# Patient Record
Sex: Female | Born: 1935 | Race: White | Hispanic: No | Smoking: Never smoker
Health system: Southern US, Community
[De-identification: ages and names within clinical notes are randomized; demographics above are authoritative.]

## PROBLEM LIST (undated history)

## (undated) DIAGNOSIS — R413 Other amnesia: Secondary | ICD-10-CM

## (undated) DIAGNOSIS — H9190 Unspecified hearing loss, unspecified ear: Secondary | ICD-10-CM

## (undated) DIAGNOSIS — J45909 Unspecified asthma, uncomplicated: Secondary | ICD-10-CM

## (undated) DIAGNOSIS — D329 Benign neoplasm of meninges, unspecified: Secondary | ICD-10-CM

## (undated) DIAGNOSIS — R739 Hyperglycemia, unspecified: Secondary | ICD-10-CM

## (undated) DIAGNOSIS — K219 Gastro-esophageal reflux disease without esophagitis: Secondary | ICD-10-CM

## (undated) DIAGNOSIS — G4700 Insomnia, unspecified: Secondary | ICD-10-CM

## (undated) DIAGNOSIS — G5600 Carpal tunnel syndrome, unspecified upper limb: Secondary | ICD-10-CM

## (undated) DIAGNOSIS — I82402 Acute embolism and thrombosis of unspecified deep veins of left lower extremity: Secondary | ICD-10-CM

## (undated) DIAGNOSIS — G8929 Other chronic pain: Secondary | ICD-10-CM

## (undated) DIAGNOSIS — M545 Low back pain, unspecified: Secondary | ICD-10-CM

## (undated) DIAGNOSIS — E785 Hyperlipidemia, unspecified: Secondary | ICD-10-CM

## (undated) DIAGNOSIS — J309 Allergic rhinitis, unspecified: Secondary | ICD-10-CM

## (undated) DIAGNOSIS — M8580 Other specified disorders of bone density and structure, unspecified site: Secondary | ICD-10-CM

## (undated) DIAGNOSIS — I10 Essential (primary) hypertension: Secondary | ICD-10-CM

## (undated) DIAGNOSIS — K5900 Constipation, unspecified: Secondary | ICD-10-CM

## (undated) HISTORY — DX: Benign neoplasm of meninges, unspecified: D32.9

## (undated) HISTORY — DX: Hyperglycemia, unspecified: R73.9

## (undated) HISTORY — DX: Other chronic pain: G89.29

## (undated) HISTORY — DX: Essential (primary) hypertension: I10

## (undated) HISTORY — DX: Other amnesia: R41.3

## (undated) HISTORY — DX: Other specified disorders of bone density and structure, unspecified site: M85.80

## (undated) HISTORY — DX: Unspecified hearing loss, unspecified ear: H91.90

## (undated) HISTORY — DX: Insomnia, unspecified: G47.00

## (undated) HISTORY — DX: Carpal tunnel syndrome, unspecified upper limb: G56.00

## (undated) HISTORY — DX: Hyperlipidemia, unspecified: E78.5

## (undated) HISTORY — DX: Constipation, unspecified: K59.00

## (undated) HISTORY — DX: Low back pain, unspecified: M54.50

## (undated) HISTORY — DX: Allergic rhinitis, unspecified: J30.9

## (undated) HISTORY — DX: Low back pain: M54.5

## (undated) HISTORY — DX: Unspecified asthma, uncomplicated: J45.909

## (undated) HISTORY — DX: Gastro-esophageal reflux disease without esophagitis: K21.9

## (undated) HISTORY — DX: Acute embolism and thrombosis of unspecified deep veins of left lower extremity: I82.402

---

## 1978-01-06 DIAGNOSIS — G5600 Carpal tunnel syndrome, unspecified upper limb: Secondary | ICD-10-CM

## 1978-01-06 HISTORY — PX: CARPAL TUNNEL RELEASE: SHX101

## 1978-01-06 HISTORY — DX: Carpal tunnel syndrome, unspecified upper limb: G56.00

## 1997-01-06 HISTORY — PX: BACK SURGERY: SHX140

## 1997-12-11 ENCOUNTER — Other Ambulatory Visit: Admission: RE | Admit: 1997-12-11 | Discharge: 1997-12-11 | Payer: Self-pay | Admitting: Family Medicine

## 1998-01-06 HISTORY — PX: BRAIN TUMOR EXCISION: SHX577

## 1998-06-07 ENCOUNTER — Encounter: Payer: Self-pay | Admitting: Neurological Surgery

## 1998-06-08 ENCOUNTER — Encounter: Payer: Self-pay | Admitting: Neurological Surgery

## 1998-06-08 ENCOUNTER — Ambulatory Visit (HOSPITAL_COMMUNITY): Admission: RE | Admit: 1998-06-08 | Discharge: 1998-06-08 | Payer: Self-pay | Admitting: Neurological Surgery

## 1998-06-11 ENCOUNTER — Encounter: Payer: Self-pay | Admitting: Neurological Surgery

## 1998-06-11 ENCOUNTER — Inpatient Hospital Stay (HOSPITAL_COMMUNITY): Admission: RE | Admit: 1998-06-11 | Discharge: 1998-06-15 | Payer: Self-pay | Admitting: Neurological Surgery

## 1998-06-21 ENCOUNTER — Inpatient Hospital Stay (HOSPITAL_COMMUNITY): Admission: AD | Admit: 1998-06-21 | Discharge: 1998-07-02 | Payer: Self-pay | Admitting: Neurological Surgery

## 1998-06-21 ENCOUNTER — Encounter: Payer: Self-pay | Admitting: Neurological Surgery

## 1998-09-12 ENCOUNTER — Encounter: Payer: Self-pay | Admitting: Neurological Surgery

## 1998-09-12 ENCOUNTER — Ambulatory Visit (HOSPITAL_COMMUNITY): Admission: RE | Admit: 1998-09-12 | Discharge: 1998-09-12 | Payer: Self-pay | Admitting: Neurological Surgery

## 1999-04-05 ENCOUNTER — Ambulatory Visit (HOSPITAL_COMMUNITY): Admission: RE | Admit: 1999-04-05 | Discharge: 1999-04-05 | Payer: Self-pay | Admitting: Neurological Surgery

## 1999-04-05 ENCOUNTER — Encounter: Payer: Self-pay | Admitting: Neurological Surgery

## 1999-05-07 ENCOUNTER — Other Ambulatory Visit: Admission: RE | Admit: 1999-05-07 | Discharge: 1999-05-07 | Payer: Self-pay | Admitting: Family Medicine

## 2000-03-06 ENCOUNTER — Encounter: Payer: Self-pay | Admitting: Neurological Surgery

## 2000-03-06 ENCOUNTER — Ambulatory Visit (HOSPITAL_COMMUNITY): Admission: RE | Admit: 2000-03-06 | Discharge: 2000-03-06 | Payer: Self-pay | Admitting: Neurological Surgery

## 2001-03-25 ENCOUNTER — Ambulatory Visit (HOSPITAL_COMMUNITY): Admission: RE | Admit: 2001-03-25 | Discharge: 2001-03-25 | Payer: Self-pay | Admitting: Neurological Surgery

## 2001-03-25 ENCOUNTER — Encounter: Payer: Self-pay | Admitting: Neurological Surgery

## 2001-06-10 ENCOUNTER — Other Ambulatory Visit: Admission: RE | Admit: 2001-06-10 | Discharge: 2001-06-10 | Payer: Self-pay | Admitting: Family Medicine

## 2001-09-28 ENCOUNTER — Ambulatory Visit (HOSPITAL_COMMUNITY): Admission: RE | Admit: 2001-09-28 | Discharge: 2001-09-28 | Payer: Self-pay | Admitting: Neurological Surgery

## 2001-09-28 ENCOUNTER — Encounter: Payer: Self-pay | Admitting: Neurological Surgery

## 2002-07-18 ENCOUNTER — Encounter: Payer: Self-pay | Admitting: Family Medicine

## 2002-07-18 ENCOUNTER — Encounter: Admission: RE | Admit: 2002-07-18 | Discharge: 2002-07-18 | Payer: Self-pay | Admitting: Family Medicine

## 2003-09-20 ENCOUNTER — Ambulatory Visit (HOSPITAL_COMMUNITY): Admission: RE | Admit: 2003-09-20 | Discharge: 2003-09-20 | Payer: Self-pay | Admitting: Neurological Surgery

## 2004-03-12 ENCOUNTER — Encounter: Admission: RE | Admit: 2004-03-12 | Discharge: 2004-06-10 | Payer: Self-pay | Admitting: Family Medicine

## 2004-04-01 ENCOUNTER — Encounter: Admission: RE | Admit: 2004-04-01 | Discharge: 2004-04-01 | Payer: Self-pay | Admitting: Family Medicine

## 2004-08-30 ENCOUNTER — Other Ambulatory Visit: Admission: RE | Admit: 2004-08-30 | Discharge: 2004-08-30 | Payer: Self-pay | Admitting: Family Medicine

## 2004-09-19 ENCOUNTER — Ambulatory Visit (HOSPITAL_COMMUNITY): Admission: RE | Admit: 2004-09-19 | Discharge: 2004-09-19 | Payer: Self-pay | Admitting: Neurological Surgery

## 2005-11-26 ENCOUNTER — Encounter: Admission: RE | Admit: 2005-11-26 | Discharge: 2005-11-26 | Payer: Self-pay | Admitting: Family Medicine

## 2006-04-04 ENCOUNTER — Encounter: Admission: RE | Admit: 2006-04-04 | Discharge: 2006-04-04 | Payer: Self-pay | Admitting: Family Medicine

## 2006-06-10 ENCOUNTER — Encounter: Admission: RE | Admit: 2006-06-10 | Discharge: 2006-06-10 | Payer: Self-pay | Admitting: Family Medicine

## 2006-09-22 ENCOUNTER — Ambulatory Visit (HOSPITAL_COMMUNITY): Admission: RE | Admit: 2006-09-22 | Discharge: 2006-09-22 | Payer: Self-pay | Admitting: Neurological Surgery

## 2006-12-11 ENCOUNTER — Encounter: Admission: RE | Admit: 2006-12-11 | Discharge: 2006-12-11 | Payer: Self-pay | Admitting: Family Medicine

## 2008-02-02 ENCOUNTER — Ambulatory Visit (HOSPITAL_COMMUNITY): Admission: RE | Admit: 2008-02-02 | Discharge: 2008-02-02 | Payer: Self-pay | Admitting: Neurological Surgery

## 2009-03-12 ENCOUNTER — Emergency Department (HOSPITAL_COMMUNITY)
Admission: EM | Admit: 2009-03-12 | Discharge: 2009-03-12 | Payer: Self-pay | Source: Home / Self Care | Admitting: Emergency Medicine

## 2009-03-12 ENCOUNTER — Encounter: Admission: RE | Admit: 2009-03-12 | Discharge: 2009-03-12 | Payer: Self-pay | Admitting: Internal Medicine

## 2010-02-11 ENCOUNTER — Emergency Department (HOSPITAL_COMMUNITY)
Admission: EM | Admit: 2010-02-11 | Discharge: 2010-02-11 | Disposition: A | Discharge status: Home / Self Care | Payer: Medicare Other | Attending: Emergency Medicine | Admitting: Emergency Medicine

## 2010-02-11 DIAGNOSIS — R42 Dizziness and giddiness: Secondary | ICD-10-CM | POA: Insufficient documentation

## 2010-02-11 DIAGNOSIS — Z79899 Other long term (current) drug therapy: Secondary | ICD-10-CM | POA: Insufficient documentation

## 2010-02-11 DIAGNOSIS — R110 Nausea: Secondary | ICD-10-CM | POA: Insufficient documentation

## 2010-02-11 DIAGNOSIS — E119 Type 2 diabetes mellitus without complications: Secondary | ICD-10-CM | POA: Insufficient documentation

## 2010-02-11 DIAGNOSIS — I10 Essential (primary) hypertension: Secondary | ICD-10-CM | POA: Insufficient documentation

## 2010-02-11 LAB — URINALYSIS, ROUTINE W REFLEX MICROSCOPIC
Bilirubin Urine: NEGATIVE
Hgb urine dipstick: NEGATIVE
Leukocytes, UA: NEGATIVE
Nitrite: NEGATIVE
Specific Gravity, Urine: 1.017 (ref 1.005–1.030)
Urobilinogen, UA: 0.2 mg/dL (ref 0.0–1.0)
pH: 8 (ref 5.0–8.0)

## 2010-02-11 LAB — POCT I-STAT, CHEM 8
Calcium, Ion: 1.15 mmol/L (ref 1.12–1.32)
Creatinine, Ser: 1.1 mg/dL (ref 0.4–1.2)
Glucose, Bld: 166 mg/dL — ABNORMAL HIGH (ref 70–99)
HCT: 38 % (ref 36.0–46.0)
Hemoglobin: 12.9 g/dL (ref 12.0–15.0)
TCO2: 30 mmol/L (ref 0–100)

## 2010-02-11 LAB — URINE MICROSCOPIC-ADD ON

## 2010-02-11 LAB — GLUCOSE, CAPILLARY: Glucose-Capillary: 118 mg/dL — ABNORMAL HIGH (ref 70–99)

## 2010-03-07 ENCOUNTER — Ambulatory Visit
Admission: RE | Admit: 2010-03-07 | Discharge: 2010-03-07 | Disposition: A | Discharge status: Home / Self Care | Payer: Medicare Other | Source: Ambulatory Visit | Attending: Internal Medicine | Admitting: Internal Medicine

## 2010-03-07 ENCOUNTER — Other Ambulatory Visit: Payer: Self-pay | Admitting: Internal Medicine

## 2010-03-07 DIAGNOSIS — R52 Pain, unspecified: Secondary | ICD-10-CM

## 2010-03-31 LAB — COMPREHENSIVE METABOLIC PANEL
ALT: 18 U/L (ref 0–35)
AST: 24 U/L (ref 0–37)
Albumin: 4 g/dL (ref 3.5–5.2)
Alkaline Phosphatase: 49 U/L (ref 39–117)
CO2: 33 mEq/L — ABNORMAL HIGH (ref 19–32)
Calcium: 9.2 mg/dL (ref 8.4–10.5)
GFR calc non Af Amer: 57 mL/min — ABNORMAL LOW (ref >60)
Total Bilirubin: 0.6 mg/dL (ref 0.3–1.2)
Total Protein: 6.9 g/dL (ref 6.0–8.3)

## 2010-03-31 LAB — CBC
HCT: 38.6 % (ref 36.0–46.0)
Hemoglobin: 13.5 g/dL (ref 12.0–15.0)
MCV: 94.1 fL (ref 78.0–100.0)
Platelets: 224 10*3/uL (ref 150–400)
RBC: 4.1 MIL/uL (ref 3.87–5.11)
WBC: 6.8 10*3/uL (ref 4.0–10.5)

## 2010-03-31 LAB — PROTIME-INR: INR: 0.92 (ref 0.00–1.49)

## 2010-03-31 LAB — DIFFERENTIAL
Monocytes Absolute: 0.4 10*3/uL (ref 0.1–1.0)
Monocytes Relative: 7 % (ref 3–12)
Neutro Abs: 4 10*3/uL (ref 1.7–7.7)
Neutrophils Relative %: 59 % (ref 43–77)

## 2010-03-31 LAB — APTT: aPTT: 24 seconds (ref 24–37)

## 2010-04-22 LAB — CREATININE, SERUM: GFR calc non Af Amer: 56 mL/min — ABNORMAL LOW (ref >60)

## 2010-10-17 LAB — BUN: BUN: 19

## 2010-10-17 LAB — CREATININE, SERUM
Creatinine, Ser: 1.08
GFR calc Af Amer: 59 — ABNORMAL LOW
GFR calc non Af Amer: 49 — ABNORMAL LOW

## 2011-01-13 DIAGNOSIS — Z7901 Long term (current) use of anticoagulants: Secondary | ICD-10-CM | POA: Diagnosis not present

## 2011-01-13 DIAGNOSIS — I82890 Acute embolism and thrombosis of other specified veins: Secondary | ICD-10-CM | POA: Diagnosis not present

## 2011-01-16 DIAGNOSIS — Z1231 Encounter for screening mammogram for malignant neoplasm of breast: Secondary | ICD-10-CM | POA: Diagnosis not present

## 2011-02-12 DIAGNOSIS — E785 Hyperlipidemia, unspecified: Secondary | ICD-10-CM | POA: Diagnosis not present

## 2011-02-12 DIAGNOSIS — K219 Gastro-esophageal reflux disease without esophagitis: Secondary | ICD-10-CM | POA: Diagnosis not present

## 2011-02-12 DIAGNOSIS — Z0000 Encounter for general adult medical examination without abnormal findings: Secondary | ICD-10-CM | POA: Diagnosis not present

## 2011-02-12 DIAGNOSIS — E119 Type 2 diabetes mellitus without complications: Secondary | ICD-10-CM | POA: Diagnosis not present

## 2011-02-12 DIAGNOSIS — I10 Essential (primary) hypertension: Secondary | ICD-10-CM | POA: Diagnosis not present

## 2011-02-12 DIAGNOSIS — E1129 Type 2 diabetes mellitus with other diabetic kidney complication: Secondary | ICD-10-CM | POA: Diagnosis not present

## 2011-02-13 DIAGNOSIS — I82890 Acute embolism and thrombosis of other specified veins: Secondary | ICD-10-CM | POA: Diagnosis not present

## 2011-02-13 DIAGNOSIS — Z7901 Long term (current) use of anticoagulants: Secondary | ICD-10-CM | POA: Diagnosis not present

## 2011-02-19 DIAGNOSIS — I10 Essential (primary) hypertension: Secondary | ICD-10-CM | POA: Diagnosis not present

## 2011-02-19 DIAGNOSIS — E1129 Type 2 diabetes mellitus with other diabetic kidney complication: Secondary | ICD-10-CM | POA: Diagnosis not present

## 2011-02-19 DIAGNOSIS — E785 Hyperlipidemia, unspecified: Secondary | ICD-10-CM | POA: Diagnosis not present

## 2011-03-06 DIAGNOSIS — H16109 Unspecified superficial keratitis, unspecified eye: Secondary | ICD-10-CM | POA: Diagnosis not present

## 2011-03-06 DIAGNOSIS — Z961 Presence of intraocular lens: Secondary | ICD-10-CM | POA: Diagnosis not present

## 2011-03-06 DIAGNOSIS — H3530 Unspecified macular degeneration: Secondary | ICD-10-CM | POA: Diagnosis not present

## 2011-03-06 DIAGNOSIS — H04229 Epiphora due to insufficient drainage, unspecified lacrimal gland: Secondary | ICD-10-CM | POA: Diagnosis not present

## 2011-03-20 DIAGNOSIS — Z7901 Long term (current) use of anticoagulants: Secondary | ICD-10-CM | POA: Diagnosis not present

## 2011-03-20 DIAGNOSIS — I824Y9 Acute embolism and thrombosis of unspecified deep veins of unspecified proximal lower extremity: Secondary | ICD-10-CM | POA: Diagnosis not present

## 2011-04-07 DIAGNOSIS — H04229 Epiphora due to insufficient drainage, unspecified lacrimal gland: Secondary | ICD-10-CM | POA: Diagnosis not present

## 2011-04-21 DIAGNOSIS — Z7901 Long term (current) use of anticoagulants: Secondary | ICD-10-CM | POA: Diagnosis not present

## 2011-04-21 DIAGNOSIS — I10 Essential (primary) hypertension: Secondary | ICD-10-CM | POA: Diagnosis not present

## 2011-04-21 DIAGNOSIS — I824Y9 Acute embolism and thrombosis of unspecified deep veins of unspecified proximal lower extremity: Secondary | ICD-10-CM | POA: Diagnosis not present

## 2011-05-06 DIAGNOSIS — Z7901 Long term (current) use of anticoagulants: Secondary | ICD-10-CM | POA: Diagnosis not present

## 2011-05-06 DIAGNOSIS — I824Y9 Acute embolism and thrombosis of unspecified deep veins of unspecified proximal lower extremity: Secondary | ICD-10-CM | POA: Diagnosis not present

## 2011-05-09 DIAGNOSIS — K5900 Constipation, unspecified: Secondary | ICD-10-CM | POA: Diagnosis not present

## 2011-05-09 DIAGNOSIS — G609 Hereditary and idiopathic neuropathy, unspecified: Secondary | ICD-10-CM | POA: Diagnosis not present

## 2011-05-20 DIAGNOSIS — Z7901 Long term (current) use of anticoagulants: Secondary | ICD-10-CM | POA: Diagnosis not present

## 2011-05-20 DIAGNOSIS — I10 Essential (primary) hypertension: Secondary | ICD-10-CM | POA: Diagnosis not present

## 2011-05-20 DIAGNOSIS — I824Y9 Acute embolism and thrombosis of unspecified deep veins of unspecified proximal lower extremity: Secondary | ICD-10-CM | POA: Diagnosis not present

## 2011-06-03 DIAGNOSIS — I824Y9 Acute embolism and thrombosis of unspecified deep veins of unspecified proximal lower extremity: Secondary | ICD-10-CM | POA: Diagnosis not present

## 2011-06-03 DIAGNOSIS — I10 Essential (primary) hypertension: Secondary | ICD-10-CM | POA: Diagnosis not present

## 2011-06-03 DIAGNOSIS — Z7901 Long term (current) use of anticoagulants: Secondary | ICD-10-CM | POA: Diagnosis not present

## 2011-06-09 DIAGNOSIS — G609 Hereditary and idiopathic neuropathy, unspecified: Secondary | ICD-10-CM | POA: Diagnosis not present

## 2011-06-09 DIAGNOSIS — Z961 Presence of intraocular lens: Secondary | ICD-10-CM | POA: Diagnosis not present

## 2011-06-09 DIAGNOSIS — K5900 Constipation, unspecified: Secondary | ICD-10-CM | POA: Diagnosis not present

## 2011-06-09 DIAGNOSIS — H04129 Dry eye syndrome of unspecified lacrimal gland: Secondary | ICD-10-CM | POA: Diagnosis not present

## 2011-06-09 DIAGNOSIS — H01009 Unspecified blepharitis unspecified eye, unspecified eyelid: Secondary | ICD-10-CM | POA: Diagnosis not present

## 2011-07-03 DIAGNOSIS — I824Y9 Acute embolism and thrombosis of unspecified deep veins of unspecified proximal lower extremity: Secondary | ICD-10-CM | POA: Diagnosis not present

## 2011-07-03 DIAGNOSIS — Z7901 Long term (current) use of anticoagulants: Secondary | ICD-10-CM | POA: Diagnosis not present

## 2011-07-03 DIAGNOSIS — I10 Essential (primary) hypertension: Secondary | ICD-10-CM | POA: Diagnosis not present

## 2011-07-08 DIAGNOSIS — H04229 Epiphora due to insufficient drainage, unspecified lacrimal gland: Secondary | ICD-10-CM | POA: Diagnosis not present

## 2011-07-18 DIAGNOSIS — H04229 Epiphora due to insufficient drainage, unspecified lacrimal gland: Secondary | ICD-10-CM | POA: Diagnosis not present

## 2011-08-04 DIAGNOSIS — Z7901 Long term (current) use of anticoagulants: Secondary | ICD-10-CM | POA: Diagnosis not present

## 2011-08-04 DIAGNOSIS — I824Y9 Acute embolism and thrombosis of unspecified deep veins of unspecified proximal lower extremity: Secondary | ICD-10-CM | POA: Diagnosis not present

## 2011-08-04 DIAGNOSIS — I10 Essential (primary) hypertension: Secondary | ICD-10-CM | POA: Diagnosis not present

## 2011-08-19 DIAGNOSIS — E1129 Type 2 diabetes mellitus with other diabetic kidney complication: Secondary | ICD-10-CM | POA: Diagnosis not present

## 2011-08-19 DIAGNOSIS — E039 Hypothyroidism, unspecified: Secondary | ICD-10-CM | POA: Diagnosis not present

## 2011-08-19 DIAGNOSIS — E785 Hyperlipidemia, unspecified: Secondary | ICD-10-CM | POA: Diagnosis not present

## 2011-08-19 DIAGNOSIS — I10 Essential (primary) hypertension: Secondary | ICD-10-CM | POA: Diagnosis not present

## 2011-08-26 DIAGNOSIS — E785 Hyperlipidemia, unspecified: Secondary | ICD-10-CM | POA: Diagnosis not present

## 2011-08-26 DIAGNOSIS — E1129 Type 2 diabetes mellitus with other diabetic kidney complication: Secondary | ICD-10-CM | POA: Diagnosis not present

## 2011-08-26 DIAGNOSIS — I10 Essential (primary) hypertension: Secondary | ICD-10-CM | POA: Diagnosis not present

## 2011-09-01 DIAGNOSIS — Z7901 Long term (current) use of anticoagulants: Secondary | ICD-10-CM | POA: Diagnosis not present

## 2011-09-01 DIAGNOSIS — I824Y9 Acute embolism and thrombosis of unspecified deep veins of unspecified proximal lower extremity: Secondary | ICD-10-CM | POA: Diagnosis not present

## 2011-09-01 DIAGNOSIS — I10 Essential (primary) hypertension: Secondary | ICD-10-CM | POA: Diagnosis not present

## 2011-09-15 DIAGNOSIS — I10 Essential (primary) hypertension: Secondary | ICD-10-CM | POA: Diagnosis not present

## 2011-09-15 DIAGNOSIS — Z7901 Long term (current) use of anticoagulants: Secondary | ICD-10-CM | POA: Diagnosis not present

## 2011-09-15 DIAGNOSIS — I824Y9 Acute embolism and thrombosis of unspecified deep veins of unspecified proximal lower extremity: Secondary | ICD-10-CM | POA: Diagnosis not present

## 2011-09-29 DIAGNOSIS — H01029 Squamous blepharitis unspecified eye, unspecified eyelid: Secondary | ICD-10-CM | POA: Diagnosis not present

## 2011-09-29 DIAGNOSIS — J342 Deviated nasal septum: Secondary | ICD-10-CM | POA: Diagnosis not present

## 2011-09-29 DIAGNOSIS — H04559 Acquired stenosis of unspecified nasolacrimal duct: Secondary | ICD-10-CM | POA: Diagnosis not present

## 2011-09-29 DIAGNOSIS — H04129 Dry eye syndrome of unspecified lacrimal gland: Secondary | ICD-10-CM | POA: Diagnosis not present

## 2011-09-29 DIAGNOSIS — H04229 Epiphora due to insufficient drainage, unspecified lacrimal gland: Secondary | ICD-10-CM | POA: Diagnosis not present

## 2011-09-29 DIAGNOSIS — H11439 Conjunctival hyperemia, unspecified eye: Secondary | ICD-10-CM | POA: Diagnosis not present

## 2011-10-14 DIAGNOSIS — Z23 Encounter for immunization: Secondary | ICD-10-CM | POA: Diagnosis not present

## 2011-10-14 DIAGNOSIS — Z7901 Long term (current) use of anticoagulants: Secondary | ICD-10-CM | POA: Diagnosis not present

## 2011-10-14 DIAGNOSIS — I824Y9 Acute embolism and thrombosis of unspecified deep veins of unspecified proximal lower extremity: Secondary | ICD-10-CM | POA: Diagnosis not present

## 2011-11-03 ENCOUNTER — Other Ambulatory Visit: Payer: Self-pay | Admitting: Ophthalmology

## 2011-11-03 DIAGNOSIS — D3150 Benign neoplasm of unspecified lacrimal gland and duct: Secondary | ICD-10-CM | POA: Diagnosis not present

## 2011-11-03 DIAGNOSIS — H04229 Epiphora due to insufficient drainage, unspecified lacrimal gland: Secondary | ICD-10-CM | POA: Diagnosis not present

## 2011-11-03 DIAGNOSIS — H5789 Other specified disorders of eye and adnexa: Secondary | ICD-10-CM | POA: Diagnosis not present

## 2011-11-03 DIAGNOSIS — H04559 Acquired stenosis of unspecified nasolacrimal duct: Secondary | ICD-10-CM | POA: Diagnosis not present

## 2011-11-03 DIAGNOSIS — H04569 Stenosis of unspecified lacrimal punctum: Secondary | ICD-10-CM | POA: Diagnosis not present

## 2011-11-25 DIAGNOSIS — Z7901 Long term (current) use of anticoagulants: Secondary | ICD-10-CM | POA: Diagnosis not present

## 2011-11-25 DIAGNOSIS — I824Y9 Acute embolism and thrombosis of unspecified deep veins of unspecified proximal lower extremity: Secondary | ICD-10-CM | POA: Diagnosis not present

## 2011-11-25 DIAGNOSIS — I10 Essential (primary) hypertension: Secondary | ICD-10-CM | POA: Diagnosis not present

## 2011-12-23 DIAGNOSIS — Z7901 Long term (current) use of anticoagulants: Secondary | ICD-10-CM | POA: Diagnosis not present

## 2011-12-23 DIAGNOSIS — I824Y9 Acute embolism and thrombosis of unspecified deep veins of unspecified proximal lower extremity: Secondary | ICD-10-CM | POA: Diagnosis not present

## 2011-12-23 DIAGNOSIS — I10 Essential (primary) hypertension: Secondary | ICD-10-CM | POA: Diagnosis not present

## 2012-01-07 HISTORY — PX: TEAR DUCT PROBING: SHX793

## 2012-01-20 DIAGNOSIS — I10 Essential (primary) hypertension: Secondary | ICD-10-CM | POA: Diagnosis not present

## 2012-01-20 DIAGNOSIS — Z7901 Long term (current) use of anticoagulants: Secondary | ICD-10-CM | POA: Diagnosis not present

## 2012-01-20 DIAGNOSIS — I824Y9 Acute embolism and thrombosis of unspecified deep veins of unspecified proximal lower extremity: Secondary | ICD-10-CM | POA: Diagnosis not present

## 2012-02-03 DIAGNOSIS — Z7901 Long term (current) use of anticoagulants: Secondary | ICD-10-CM | POA: Diagnosis not present

## 2012-02-03 DIAGNOSIS — I824Y9 Acute embolism and thrombosis of unspecified deep veins of unspecified proximal lower extremity: Secondary | ICD-10-CM | POA: Diagnosis not present

## 2012-03-02 DIAGNOSIS — Z7901 Long term (current) use of anticoagulants: Secondary | ICD-10-CM | POA: Diagnosis not present

## 2012-03-02 DIAGNOSIS — I10 Essential (primary) hypertension: Secondary | ICD-10-CM | POA: Diagnosis not present

## 2012-03-02 DIAGNOSIS — I824Y9 Acute embolism and thrombosis of unspecified deep veins of unspecified proximal lower extremity: Secondary | ICD-10-CM | POA: Diagnosis not present

## 2012-03-05 DIAGNOSIS — E1129 Type 2 diabetes mellitus with other diabetic kidney complication: Secondary | ICD-10-CM | POA: Diagnosis not present

## 2012-03-05 DIAGNOSIS — E785 Hyperlipidemia, unspecified: Secondary | ICD-10-CM | POA: Diagnosis not present

## 2012-03-05 DIAGNOSIS — Z1331 Encounter for screening for depression: Secondary | ICD-10-CM | POA: Diagnosis not present

## 2012-03-05 DIAGNOSIS — E039 Hypothyroidism, unspecified: Secondary | ICD-10-CM | POA: Diagnosis not present

## 2012-03-05 DIAGNOSIS — Z0000 Encounter for general adult medical examination without abnormal findings: Secondary | ICD-10-CM | POA: Diagnosis not present

## 2012-03-05 DIAGNOSIS — I10 Essential (primary) hypertension: Secondary | ICD-10-CM | POA: Diagnosis not present

## 2012-03-11 DIAGNOSIS — E1129 Type 2 diabetes mellitus with other diabetic kidney complication: Secondary | ICD-10-CM | POA: Diagnosis not present

## 2012-03-11 DIAGNOSIS — I10 Essential (primary) hypertension: Secondary | ICD-10-CM | POA: Diagnosis not present

## 2012-03-11 DIAGNOSIS — E785 Hyperlipidemia, unspecified: Secondary | ICD-10-CM | POA: Diagnosis not present

## 2012-03-30 DIAGNOSIS — I10 Essential (primary) hypertension: Secondary | ICD-10-CM | POA: Diagnosis not present

## 2012-03-30 DIAGNOSIS — Z7901 Long term (current) use of anticoagulants: Secondary | ICD-10-CM | POA: Diagnosis not present

## 2012-03-30 DIAGNOSIS — I824Y9 Acute embolism and thrombosis of unspecified deep veins of unspecified proximal lower extremity: Secondary | ICD-10-CM | POA: Diagnosis not present

## 2012-04-09 DIAGNOSIS — E119 Type 2 diabetes mellitus without complications: Secondary | ICD-10-CM | POA: Diagnosis not present

## 2012-04-09 DIAGNOSIS — H6120 Impacted cerumen, unspecified ear: Secondary | ICD-10-CM | POA: Diagnosis not present

## 2012-04-09 DIAGNOSIS — H52209 Unspecified astigmatism, unspecified eye: Secondary | ICD-10-CM | POA: Diagnosis not present

## 2012-04-09 DIAGNOSIS — H0019 Chalazion unspecified eye, unspecified eyelid: Secondary | ICD-10-CM | POA: Diagnosis not present

## 2012-04-09 DIAGNOSIS — H3530 Unspecified macular degeneration: Secondary | ICD-10-CM | POA: Diagnosis not present

## 2012-04-29 DIAGNOSIS — Z7901 Long term (current) use of anticoagulants: Secondary | ICD-10-CM | POA: Diagnosis not present

## 2012-04-29 DIAGNOSIS — I10 Essential (primary) hypertension: Secondary | ICD-10-CM | POA: Diagnosis not present

## 2012-04-29 DIAGNOSIS — I824Y9 Acute embolism and thrombosis of unspecified deep veins of unspecified proximal lower extremity: Secondary | ICD-10-CM | POA: Diagnosis not present

## 2012-04-30 DIAGNOSIS — J019 Acute sinusitis, unspecified: Secondary | ICD-10-CM | POA: Diagnosis not present

## 2012-05-27 DIAGNOSIS — Z7901 Long term (current) use of anticoagulants: Secondary | ICD-10-CM | POA: Diagnosis not present

## 2012-05-27 DIAGNOSIS — I824Y9 Acute embolism and thrombosis of unspecified deep veins of unspecified proximal lower extremity: Secondary | ICD-10-CM | POA: Diagnosis not present

## 2012-06-10 DIAGNOSIS — H579 Unspecified disorder of eye and adnexa: Secondary | ICD-10-CM | POA: Diagnosis not present

## 2012-06-10 DIAGNOSIS — H01009 Unspecified blepharitis unspecified eye, unspecified eyelid: Secondary | ICD-10-CM | POA: Diagnosis not present

## 2012-06-10 DIAGNOSIS — H04129 Dry eye syndrome of unspecified lacrimal gland: Secondary | ICD-10-CM | POA: Diagnosis not present

## 2012-06-10 DIAGNOSIS — Z7901 Long term (current) use of anticoagulants: Secondary | ICD-10-CM | POA: Diagnosis not present

## 2012-06-10 DIAGNOSIS — I824Y9 Acute embolism and thrombosis of unspecified deep veins of unspecified proximal lower extremity: Secondary | ICD-10-CM | POA: Diagnosis not present

## 2012-07-12 DIAGNOSIS — Z7901 Long term (current) use of anticoagulants: Secondary | ICD-10-CM | POA: Diagnosis not present

## 2012-07-12 DIAGNOSIS — I10 Essential (primary) hypertension: Secondary | ICD-10-CM | POA: Diagnosis not present

## 2012-07-12 DIAGNOSIS — I824Y9 Acute embolism and thrombosis of unspecified deep veins of unspecified proximal lower extremity: Secondary | ICD-10-CM | POA: Diagnosis not present

## 2012-07-27 DIAGNOSIS — I824Y9 Acute embolism and thrombosis of unspecified deep veins of unspecified proximal lower extremity: Secondary | ICD-10-CM | POA: Diagnosis not present

## 2012-07-27 DIAGNOSIS — Z7901 Long term (current) use of anticoagulants: Secondary | ICD-10-CM | POA: Diagnosis not present

## 2012-08-02 DIAGNOSIS — K5900 Constipation, unspecified: Secondary | ICD-10-CM | POA: Diagnosis not present

## 2012-08-18 DIAGNOSIS — H01009 Unspecified blepharitis unspecified eye, unspecified eyelid: Secondary | ICD-10-CM | POA: Diagnosis not present

## 2012-08-18 DIAGNOSIS — Z961 Presence of intraocular lens: Secondary | ICD-10-CM | POA: Diagnosis not present

## 2012-08-18 DIAGNOSIS — H1010 Acute atopic conjunctivitis, unspecified eye: Secondary | ICD-10-CM | POA: Diagnosis not present

## 2012-08-26 DIAGNOSIS — Z7901 Long term (current) use of anticoagulants: Secondary | ICD-10-CM | POA: Diagnosis not present

## 2012-08-26 DIAGNOSIS — I824Y9 Acute embolism and thrombosis of unspecified deep veins of unspecified proximal lower extremity: Secondary | ICD-10-CM | POA: Diagnosis not present

## 2012-09-08 DIAGNOSIS — H1045 Other chronic allergic conjunctivitis: Secondary | ICD-10-CM | POA: Diagnosis not present

## 2012-09-08 DIAGNOSIS — H01009 Unspecified blepharitis unspecified eye, unspecified eyelid: Secondary | ICD-10-CM | POA: Diagnosis not present

## 2012-09-08 DIAGNOSIS — H04229 Epiphora due to insufficient drainage, unspecified lacrimal gland: Secondary | ICD-10-CM | POA: Diagnosis not present

## 2012-09-09 DIAGNOSIS — I10 Essential (primary) hypertension: Secondary | ICD-10-CM | POA: Diagnosis not present

## 2012-09-09 DIAGNOSIS — E039 Hypothyroidism, unspecified: Secondary | ICD-10-CM | POA: Diagnosis not present

## 2012-09-09 DIAGNOSIS — E1129 Type 2 diabetes mellitus with other diabetic kidney complication: Secondary | ICD-10-CM | POA: Diagnosis not present

## 2012-09-16 DIAGNOSIS — E1129 Type 2 diabetes mellitus with other diabetic kidney complication: Secondary | ICD-10-CM | POA: Diagnosis not present

## 2012-09-16 DIAGNOSIS — Z23 Encounter for immunization: Secondary | ICD-10-CM | POA: Diagnosis not present

## 2012-09-16 DIAGNOSIS — E785 Hyperlipidemia, unspecified: Secondary | ICD-10-CM | POA: Diagnosis not present

## 2012-09-16 DIAGNOSIS — I10 Essential (primary) hypertension: Secondary | ICD-10-CM | POA: Diagnosis not present

## 2012-09-16 DIAGNOSIS — E039 Hypothyroidism, unspecified: Secondary | ICD-10-CM | POA: Diagnosis not present

## 2012-09-23 DIAGNOSIS — Z7901 Long term (current) use of anticoagulants: Secondary | ICD-10-CM | POA: Diagnosis not present

## 2012-09-23 DIAGNOSIS — I824Y9 Acute embolism and thrombosis of unspecified deep veins of unspecified proximal lower extremity: Secondary | ICD-10-CM | POA: Diagnosis not present

## 2012-10-15 DIAGNOSIS — H01029 Squamous blepharitis unspecified eye, unspecified eyelid: Secondary | ICD-10-CM | POA: Diagnosis not present

## 2012-10-15 DIAGNOSIS — H11439 Conjunctival hyperemia, unspecified eye: Secondary | ICD-10-CM | POA: Diagnosis not present

## 2012-10-15 DIAGNOSIS — H04229 Epiphora due to insufficient drainage, unspecified lacrimal gland: Secondary | ICD-10-CM | POA: Diagnosis not present

## 2012-10-15 DIAGNOSIS — H04129 Dry eye syndrome of unspecified lacrimal gland: Secondary | ICD-10-CM | POA: Diagnosis not present

## 2012-10-15 DIAGNOSIS — H04559 Acquired stenosis of unspecified nasolacrimal duct: Secondary | ICD-10-CM | POA: Diagnosis not present

## 2012-10-21 DIAGNOSIS — Z7901 Long term (current) use of anticoagulants: Secondary | ICD-10-CM | POA: Diagnosis not present

## 2012-10-21 DIAGNOSIS — I824Y9 Acute embolism and thrombosis of unspecified deep veins of unspecified proximal lower extremity: Secondary | ICD-10-CM | POA: Diagnosis not present

## 2012-11-02 DIAGNOSIS — I10 Essential (primary) hypertension: Secondary | ICD-10-CM | POA: Diagnosis not present

## 2012-11-02 DIAGNOSIS — Z01818 Encounter for other preprocedural examination: Secondary | ICD-10-CM | POA: Diagnosis not present

## 2012-11-04 DIAGNOSIS — I824Y9 Acute embolism and thrombosis of unspecified deep veins of unspecified proximal lower extremity: Secondary | ICD-10-CM | POA: Diagnosis not present

## 2012-11-04 DIAGNOSIS — E039 Hypothyroidism, unspecified: Secondary | ICD-10-CM | POA: Diagnosis not present

## 2012-11-04 DIAGNOSIS — Z7901 Long term (current) use of anticoagulants: Secondary | ICD-10-CM | POA: Diagnosis not present

## 2012-11-04 DIAGNOSIS — E785 Hyperlipidemia, unspecified: Secondary | ICD-10-CM | POA: Diagnosis not present

## 2012-11-04 DIAGNOSIS — Z006 Encounter for examination for normal comparison and control in clinical research program: Secondary | ICD-10-CM | POA: Diagnosis not present

## 2012-11-04 DIAGNOSIS — I10 Essential (primary) hypertension: Secondary | ICD-10-CM | POA: Diagnosis not present

## 2012-11-29 DIAGNOSIS — I824Y9 Acute embolism and thrombosis of unspecified deep veins of unspecified proximal lower extremity: Secondary | ICD-10-CM | POA: Diagnosis not present

## 2012-11-29 DIAGNOSIS — Z7901 Long term (current) use of anticoagulants: Secondary | ICD-10-CM | POA: Diagnosis not present

## 2012-12-27 DIAGNOSIS — I824Y9 Acute embolism and thrombosis of unspecified deep veins of unspecified proximal lower extremity: Secondary | ICD-10-CM | POA: Diagnosis not present

## 2012-12-27 DIAGNOSIS — Z7901 Long term (current) use of anticoagulants: Secondary | ICD-10-CM | POA: Diagnosis not present

## 2013-01-10 DIAGNOSIS — K5900 Constipation, unspecified: Secondary | ICD-10-CM | POA: Diagnosis not present

## 2013-01-17 ENCOUNTER — Other Ambulatory Visit: Payer: Self-pay | Admitting: Ophthalmology

## 2013-01-17 DIAGNOSIS — D3150 Benign neoplasm of unspecified lacrimal gland and duct: Secondary | ICD-10-CM | POA: Diagnosis not present

## 2013-01-17 DIAGNOSIS — H04229 Epiphora due to insufficient drainage, unspecified lacrimal gland: Secondary | ICD-10-CM | POA: Diagnosis not present

## 2013-01-17 DIAGNOSIS — H04039 Chronic enlargement of unspecified lacrimal gland: Secondary | ICD-10-CM | POA: Diagnosis not present

## 2013-01-17 DIAGNOSIS — H04559 Acquired stenosis of unspecified nasolacrimal duct: Secondary | ICD-10-CM | POA: Diagnosis not present

## 2013-01-27 DIAGNOSIS — I824Y9 Acute embolism and thrombosis of unspecified deep veins of unspecified proximal lower extremity: Secondary | ICD-10-CM | POA: Diagnosis not present

## 2013-01-27 DIAGNOSIS — I10 Essential (primary) hypertension: Secondary | ICD-10-CM | POA: Diagnosis not present

## 2013-01-27 DIAGNOSIS — Z7901 Long term (current) use of anticoagulants: Secondary | ICD-10-CM | POA: Diagnosis not present

## 2013-01-31 DIAGNOSIS — H01029 Squamous blepharitis unspecified eye, unspecified eyelid: Secondary | ICD-10-CM | POA: Diagnosis not present

## 2013-01-31 DIAGNOSIS — H04559 Acquired stenosis of unspecified nasolacrimal duct: Secondary | ICD-10-CM | POA: Diagnosis not present

## 2013-01-31 DIAGNOSIS — J3481 Nasal mucositis (ulcerative): Secondary | ICD-10-CM | POA: Diagnosis not present

## 2013-01-31 DIAGNOSIS — H11439 Conjunctival hyperemia, unspecified eye: Secondary | ICD-10-CM | POA: Diagnosis not present

## 2013-01-31 DIAGNOSIS — H04229 Epiphora due to insufficient drainage, unspecified lacrimal gland: Secondary | ICD-10-CM | POA: Diagnosis not present

## 2013-02-10 DIAGNOSIS — Z7901 Long term (current) use of anticoagulants: Secondary | ICD-10-CM | POA: Diagnosis not present

## 2013-02-10 DIAGNOSIS — I824Y9 Acute embolism and thrombosis of unspecified deep veins of unspecified proximal lower extremity: Secondary | ICD-10-CM | POA: Diagnosis not present

## 2013-02-24 DIAGNOSIS — Z7901 Long term (current) use of anticoagulants: Secondary | ICD-10-CM | POA: Diagnosis not present

## 2013-02-24 DIAGNOSIS — I824Y9 Acute embolism and thrombosis of unspecified deep veins of unspecified proximal lower extremity: Secondary | ICD-10-CM | POA: Diagnosis not present

## 2013-02-24 DIAGNOSIS — I10 Essential (primary) hypertension: Secondary | ICD-10-CM | POA: Diagnosis not present

## 2013-02-28 DIAGNOSIS — H01029 Squamous blepharitis unspecified eye, unspecified eyelid: Secondary | ICD-10-CM | POA: Diagnosis not present

## 2013-02-28 DIAGNOSIS — H04129 Dry eye syndrome of unspecified lacrimal gland: Secondary | ICD-10-CM | POA: Diagnosis not present

## 2013-02-28 DIAGNOSIS — H04229 Epiphora due to insufficient drainage, unspecified lacrimal gland: Secondary | ICD-10-CM | POA: Diagnosis not present

## 2013-02-28 DIAGNOSIS — J3481 Nasal mucositis (ulcerative): Secondary | ICD-10-CM | POA: Diagnosis not present

## 2013-02-28 DIAGNOSIS — H04559 Acquired stenosis of unspecified nasolacrimal duct: Secondary | ICD-10-CM | POA: Diagnosis not present

## 2013-03-10 DIAGNOSIS — E663 Overweight: Secondary | ICD-10-CM | POA: Diagnosis not present

## 2013-03-10 DIAGNOSIS — Z0000 Encounter for general adult medical examination without abnormal findings: Secondary | ICD-10-CM | POA: Diagnosis not present

## 2013-03-10 DIAGNOSIS — Z1331 Encounter for screening for depression: Secondary | ICD-10-CM | POA: Diagnosis not present

## 2013-03-10 DIAGNOSIS — E1129 Type 2 diabetes mellitus with other diabetic kidney complication: Secondary | ICD-10-CM | POA: Diagnosis not present

## 2013-03-10 DIAGNOSIS — E039 Hypothyroidism, unspecified: Secondary | ICD-10-CM | POA: Diagnosis not present

## 2013-03-10 DIAGNOSIS — I10 Essential (primary) hypertension: Secondary | ICD-10-CM | POA: Diagnosis not present

## 2013-03-14 DIAGNOSIS — H01009 Unspecified blepharitis unspecified eye, unspecified eyelid: Secondary | ICD-10-CM | POA: Diagnosis not present

## 2013-03-14 DIAGNOSIS — H1045 Other chronic allergic conjunctivitis: Secondary | ICD-10-CM | POA: Diagnosis not present

## 2013-03-14 DIAGNOSIS — H04129 Dry eye syndrome of unspecified lacrimal gland: Secondary | ICD-10-CM | POA: Diagnosis not present

## 2013-03-17 DIAGNOSIS — N183 Chronic kidney disease, stage 3 unspecified: Secondary | ICD-10-CM | POA: Diagnosis not present

## 2013-03-17 DIAGNOSIS — E1129 Type 2 diabetes mellitus with other diabetic kidney complication: Secondary | ICD-10-CM | POA: Diagnosis not present

## 2013-03-17 DIAGNOSIS — I10 Essential (primary) hypertension: Secondary | ICD-10-CM | POA: Diagnosis not present

## 2013-03-17 DIAGNOSIS — E785 Hyperlipidemia, unspecified: Secondary | ICD-10-CM | POA: Diagnosis not present

## 2013-03-24 DIAGNOSIS — Z7901 Long term (current) use of anticoagulants: Secondary | ICD-10-CM | POA: Diagnosis not present

## 2013-03-24 DIAGNOSIS — I824Y9 Acute embolism and thrombosis of unspecified deep veins of unspecified proximal lower extremity: Secondary | ICD-10-CM | POA: Diagnosis not present

## 2013-04-05 DIAGNOSIS — R293 Abnormal posture: Secondary | ICD-10-CM | POA: Diagnosis not present

## 2013-04-05 DIAGNOSIS — M25559 Pain in unspecified hip: Secondary | ICD-10-CM | POA: Diagnosis not present

## 2013-04-12 DIAGNOSIS — M25559 Pain in unspecified hip: Secondary | ICD-10-CM | POA: Diagnosis not present

## 2013-04-12 DIAGNOSIS — R293 Abnormal posture: Secondary | ICD-10-CM | POA: Diagnosis not present

## 2013-04-12 DIAGNOSIS — R41841 Cognitive communication deficit: Secondary | ICD-10-CM | POA: Diagnosis not present

## 2013-04-14 DIAGNOSIS — M25559 Pain in unspecified hip: Secondary | ICD-10-CM | POA: Diagnosis not present

## 2013-04-14 DIAGNOSIS — R293 Abnormal posture: Secondary | ICD-10-CM | POA: Diagnosis not present

## 2013-04-14 DIAGNOSIS — R41841 Cognitive communication deficit: Secondary | ICD-10-CM | POA: Diagnosis not present

## 2013-04-18 DIAGNOSIS — M25559 Pain in unspecified hip: Secondary | ICD-10-CM | POA: Diagnosis not present

## 2013-04-18 DIAGNOSIS — R293 Abnormal posture: Secondary | ICD-10-CM | POA: Diagnosis not present

## 2013-04-18 DIAGNOSIS — R41841 Cognitive communication deficit: Secondary | ICD-10-CM | POA: Diagnosis not present

## 2013-04-20 DIAGNOSIS — M25559 Pain in unspecified hip: Secondary | ICD-10-CM | POA: Diagnosis not present

## 2013-04-20 DIAGNOSIS — R41841 Cognitive communication deficit: Secondary | ICD-10-CM | POA: Diagnosis not present

## 2013-04-20 DIAGNOSIS — R293 Abnormal posture: Secondary | ICD-10-CM | POA: Diagnosis not present

## 2013-04-22 DIAGNOSIS — R293 Abnormal posture: Secondary | ICD-10-CM | POA: Diagnosis not present

## 2013-04-22 DIAGNOSIS — M25559 Pain in unspecified hip: Secondary | ICD-10-CM | POA: Diagnosis not present

## 2013-04-22 DIAGNOSIS — R41841 Cognitive communication deficit: Secondary | ICD-10-CM | POA: Diagnosis not present

## 2013-04-25 DIAGNOSIS — M25559 Pain in unspecified hip: Secondary | ICD-10-CM | POA: Diagnosis not present

## 2013-04-25 DIAGNOSIS — R293 Abnormal posture: Secondary | ICD-10-CM | POA: Diagnosis not present

## 2013-04-25 DIAGNOSIS — I824Y9 Acute embolism and thrombosis of unspecified deep veins of unspecified proximal lower extremity: Secondary | ICD-10-CM | POA: Diagnosis not present

## 2013-04-25 DIAGNOSIS — R41841 Cognitive communication deficit: Secondary | ICD-10-CM | POA: Diagnosis not present

## 2013-04-25 DIAGNOSIS — Z7901 Long term (current) use of anticoagulants: Secondary | ICD-10-CM | POA: Diagnosis not present

## 2013-04-27 DIAGNOSIS — R293 Abnormal posture: Secondary | ICD-10-CM | POA: Diagnosis not present

## 2013-04-27 DIAGNOSIS — M25559 Pain in unspecified hip: Secondary | ICD-10-CM | POA: Diagnosis not present

## 2013-04-27 DIAGNOSIS — R41841 Cognitive communication deficit: Secondary | ICD-10-CM | POA: Diagnosis not present

## 2013-04-29 DIAGNOSIS — M25559 Pain in unspecified hip: Secondary | ICD-10-CM | POA: Diagnosis not present

## 2013-04-29 DIAGNOSIS — R293 Abnormal posture: Secondary | ICD-10-CM | POA: Diagnosis not present

## 2013-04-29 DIAGNOSIS — R41841 Cognitive communication deficit: Secondary | ICD-10-CM | POA: Diagnosis not present

## 2013-05-02 DIAGNOSIS — R41841 Cognitive communication deficit: Secondary | ICD-10-CM | POA: Diagnosis not present

## 2013-05-02 DIAGNOSIS — R293 Abnormal posture: Secondary | ICD-10-CM | POA: Diagnosis not present

## 2013-05-02 DIAGNOSIS — M25559 Pain in unspecified hip: Secondary | ICD-10-CM | POA: Diagnosis not present

## 2013-05-04 DIAGNOSIS — M25559 Pain in unspecified hip: Secondary | ICD-10-CM | POA: Diagnosis not present

## 2013-05-04 DIAGNOSIS — R293 Abnormal posture: Secondary | ICD-10-CM | POA: Diagnosis not present

## 2013-05-04 DIAGNOSIS — R41841 Cognitive communication deficit: Secondary | ICD-10-CM | POA: Diagnosis not present

## 2013-05-05 DIAGNOSIS — F0280 Dementia in other diseases classified elsewhere without behavioral disturbance: Secondary | ICD-10-CM | POA: Diagnosis not present

## 2013-05-05 DIAGNOSIS — M545 Low back pain, unspecified: Secondary | ICD-10-CM | POA: Diagnosis not present

## 2013-05-05 DIAGNOSIS — G309 Alzheimer's disease, unspecified: Secondary | ICD-10-CM | POA: Diagnosis not present

## 2013-05-05 DIAGNOSIS — G4700 Insomnia, unspecified: Secondary | ICD-10-CM | POA: Diagnosis not present

## 2013-05-06 DIAGNOSIS — R41841 Cognitive communication deficit: Secondary | ICD-10-CM | POA: Diagnosis not present

## 2013-05-06 DIAGNOSIS — M25559 Pain in unspecified hip: Secondary | ICD-10-CM | POA: Diagnosis not present

## 2013-05-06 DIAGNOSIS — R293 Abnormal posture: Secondary | ICD-10-CM | POA: Diagnosis not present

## 2013-05-09 DIAGNOSIS — Z7901 Long term (current) use of anticoagulants: Secondary | ICD-10-CM | POA: Diagnosis not present

## 2013-05-23 DIAGNOSIS — Z7901 Long term (current) use of anticoagulants: Secondary | ICD-10-CM | POA: Diagnosis not present

## 2013-06-06 DIAGNOSIS — R41841 Cognitive communication deficit: Secondary | ICD-10-CM | POA: Diagnosis not present

## 2013-06-06 DIAGNOSIS — Z7901 Long term (current) use of anticoagulants: Secondary | ICD-10-CM | POA: Diagnosis not present

## 2013-06-14 DIAGNOSIS — E039 Hypothyroidism, unspecified: Secondary | ICD-10-CM | POA: Diagnosis not present

## 2013-06-14 DIAGNOSIS — Z7901 Long term (current) use of anticoagulants: Secondary | ICD-10-CM | POA: Diagnosis not present

## 2013-06-14 DIAGNOSIS — I824Y9 Acute embolism and thrombosis of unspecified deep veins of unspecified proximal lower extremity: Secondary | ICD-10-CM | POA: Diagnosis not present

## 2013-06-14 DIAGNOSIS — Z86718 Personal history of other venous thrombosis and embolism: Secondary | ICD-10-CM | POA: Diagnosis not present

## 2013-06-14 DIAGNOSIS — E1129 Type 2 diabetes mellitus with other diabetic kidney complication: Secondary | ICD-10-CM | POA: Diagnosis not present

## 2013-06-14 DIAGNOSIS — E785 Hyperlipidemia, unspecified: Secondary | ICD-10-CM | POA: Diagnosis not present

## 2013-06-20 DIAGNOSIS — Z7901 Long term (current) use of anticoagulants: Secondary | ICD-10-CM | POA: Diagnosis not present

## 2013-06-27 DIAGNOSIS — I10 Essential (primary) hypertension: Secondary | ICD-10-CM | POA: Diagnosis not present

## 2013-06-27 DIAGNOSIS — N183 Chronic kidney disease, stage 3 unspecified: Secondary | ICD-10-CM | POA: Diagnosis not present

## 2013-06-27 DIAGNOSIS — E1129 Type 2 diabetes mellitus with other diabetic kidney complication: Secondary | ICD-10-CM | POA: Diagnosis not present

## 2013-06-27 DIAGNOSIS — E785 Hyperlipidemia, unspecified: Secondary | ICD-10-CM | POA: Diagnosis not present

## 2013-07-04 DIAGNOSIS — Z7901 Long term (current) use of anticoagulants: Secondary | ICD-10-CM | POA: Diagnosis not present

## 2013-08-01 DIAGNOSIS — Z7901 Long term (current) use of anticoagulants: Secondary | ICD-10-CM | POA: Diagnosis not present

## 2013-08-08 DIAGNOSIS — J309 Allergic rhinitis, unspecified: Secondary | ICD-10-CM | POA: Diagnosis not present

## 2013-08-08 DIAGNOSIS — R5383 Other fatigue: Secondary | ICD-10-CM | POA: Diagnosis not present

## 2013-08-08 DIAGNOSIS — R5381 Other malaise: Secondary | ICD-10-CM | POA: Diagnosis not present

## 2013-08-10 DIAGNOSIS — H04129 Dry eye syndrome of unspecified lacrimal gland: Secondary | ICD-10-CM | POA: Diagnosis not present

## 2013-08-10 DIAGNOSIS — Z961 Presence of intraocular lens: Secondary | ICD-10-CM | POA: Diagnosis not present

## 2013-08-10 DIAGNOSIS — H1045 Other chronic allergic conjunctivitis: Secondary | ICD-10-CM | POA: Diagnosis not present

## 2013-08-10 DIAGNOSIS — H04219 Epiphora due to excess lacrimation, unspecified lacrimal gland: Secondary | ICD-10-CM | POA: Diagnosis not present

## 2013-08-25 DIAGNOSIS — H04219 Epiphora due to excess lacrimation, unspecified lacrimal gland: Secondary | ICD-10-CM | POA: Diagnosis not present

## 2013-08-25 DIAGNOSIS — E119 Type 2 diabetes mellitus without complications: Secondary | ICD-10-CM | POA: Diagnosis not present

## 2013-08-25 DIAGNOSIS — H3530 Unspecified macular degeneration: Secondary | ICD-10-CM | POA: Diagnosis not present

## 2013-08-25 DIAGNOSIS — Z961 Presence of intraocular lens: Secondary | ICD-10-CM | POA: Diagnosis not present

## 2013-09-01 DIAGNOSIS — Z7901 Long term (current) use of anticoagulants: Secondary | ICD-10-CM | POA: Diagnosis not present

## 2013-09-08 DIAGNOSIS — Z7901 Long term (current) use of anticoagulants: Secondary | ICD-10-CM | POA: Diagnosis not present

## 2013-09-22 DIAGNOSIS — I10 Essential (primary) hypertension: Secondary | ICD-10-CM | POA: Diagnosis not present

## 2013-09-22 DIAGNOSIS — E785 Hyperlipidemia, unspecified: Secondary | ICD-10-CM | POA: Diagnosis not present

## 2013-09-22 DIAGNOSIS — Z86718 Personal history of other venous thrombosis and embolism: Secondary | ICD-10-CM | POA: Diagnosis not present

## 2013-09-22 DIAGNOSIS — E1129 Type 2 diabetes mellitus with other diabetic kidney complication: Secondary | ICD-10-CM | POA: Diagnosis not present

## 2013-09-22 DIAGNOSIS — Z7901 Long term (current) use of anticoagulants: Secondary | ICD-10-CM | POA: Diagnosis not present

## 2013-09-27 DIAGNOSIS — E1129 Type 2 diabetes mellitus with other diabetic kidney complication: Secondary | ICD-10-CM | POA: Diagnosis not present

## 2013-09-27 DIAGNOSIS — E785 Hyperlipidemia, unspecified: Secondary | ICD-10-CM | POA: Diagnosis not present

## 2013-09-27 DIAGNOSIS — N183 Chronic kidney disease, stage 3 unspecified: Secondary | ICD-10-CM | POA: Diagnosis not present

## 2013-09-27 DIAGNOSIS — I10 Essential (primary) hypertension: Secondary | ICD-10-CM | POA: Diagnosis not present

## 2013-10-20 DIAGNOSIS — Z7901 Long term (current) use of anticoagulants: Secondary | ICD-10-CM | POA: Diagnosis not present

## 2013-10-20 DIAGNOSIS — Z86718 Personal history of other venous thrombosis and embolism: Secondary | ICD-10-CM | POA: Diagnosis not present

## 2013-11-17 DIAGNOSIS — Z7901 Long term (current) use of anticoagulants: Secondary | ICD-10-CM | POA: Diagnosis not present

## 2013-11-17 DIAGNOSIS — Z86718 Personal history of other venous thrombosis and embolism: Secondary | ICD-10-CM | POA: Diagnosis not present

## 2013-12-15 DIAGNOSIS — Z86718 Personal history of other venous thrombosis and embolism: Secondary | ICD-10-CM | POA: Diagnosis not present

## 2013-12-20 DIAGNOSIS — Z7901 Long term (current) use of anticoagulants: Secondary | ICD-10-CM | POA: Diagnosis not present

## 2013-12-20 DIAGNOSIS — E119 Type 2 diabetes mellitus without complications: Secondary | ICD-10-CM | POA: Diagnosis not present

## 2013-12-20 DIAGNOSIS — Z86718 Personal history of other venous thrombosis and embolism: Secondary | ICD-10-CM | POA: Diagnosis not present

## 2014-01-12 DIAGNOSIS — Z7901 Long term (current) use of anticoagulants: Secondary | ICD-10-CM | POA: Diagnosis not present

## 2014-01-12 DIAGNOSIS — Z86718 Personal history of other venous thrombosis and embolism: Secondary | ICD-10-CM | POA: Diagnosis not present

## 2014-01-26 DIAGNOSIS — Z86718 Personal history of other venous thrombosis and embolism: Secondary | ICD-10-CM | POA: Diagnosis not present

## 2014-01-26 DIAGNOSIS — Z7901 Long term (current) use of anticoagulants: Secondary | ICD-10-CM | POA: Diagnosis not present

## 2014-02-09 DIAGNOSIS — Z7901 Long term (current) use of anticoagulants: Secondary | ICD-10-CM | POA: Diagnosis not present

## 2014-02-09 DIAGNOSIS — Z86718 Personal history of other venous thrombosis and embolism: Secondary | ICD-10-CM | POA: Diagnosis not present

## 2014-03-09 DIAGNOSIS — Z86718 Personal history of other venous thrombosis and embolism: Secondary | ICD-10-CM | POA: Diagnosis not present

## 2014-03-09 DIAGNOSIS — Z7901 Long term (current) use of anticoagulants: Secondary | ICD-10-CM | POA: Diagnosis not present

## 2014-03-21 DIAGNOSIS — Z23 Encounter for immunization: Secondary | ICD-10-CM | POA: Diagnosis not present

## 2014-03-21 DIAGNOSIS — Z1389 Encounter for screening for other disorder: Secondary | ICD-10-CM | POA: Diagnosis not present

## 2014-03-21 DIAGNOSIS — Z0000 Encounter for general adult medical examination without abnormal findings: Secondary | ICD-10-CM | POA: Diagnosis not present

## 2014-03-21 DIAGNOSIS — I10 Essential (primary) hypertension: Secondary | ICD-10-CM | POA: Diagnosis not present

## 2014-03-21 DIAGNOSIS — E1329 Other specified diabetes mellitus with other diabetic kidney complication: Secondary | ICD-10-CM | POA: Diagnosis not present

## 2014-03-28 DIAGNOSIS — I10 Essential (primary) hypertension: Secondary | ICD-10-CM | POA: Diagnosis not present

## 2014-03-28 DIAGNOSIS — E785 Hyperlipidemia, unspecified: Secondary | ICD-10-CM | POA: Diagnosis not present

## 2014-03-28 DIAGNOSIS — E1122 Type 2 diabetes mellitus with diabetic chronic kidney disease: Secondary | ICD-10-CM | POA: Diagnosis not present

## 2014-03-28 DIAGNOSIS — N183 Chronic kidney disease, stage 3 (moderate): Secondary | ICD-10-CM | POA: Diagnosis not present

## 2014-03-28 DIAGNOSIS — E1140 Type 2 diabetes mellitus with diabetic neuropathy, unspecified: Secondary | ICD-10-CM | POA: Diagnosis not present

## 2014-04-24 DIAGNOSIS — H6120 Impacted cerumen, unspecified ear: Secondary | ICD-10-CM | POA: Diagnosis not present

## 2014-04-24 DIAGNOSIS — H9201 Otalgia, right ear: Secondary | ICD-10-CM | POA: Diagnosis not present

## 2014-06-27 ENCOUNTER — Encounter: Payer: Self-pay | Admitting: Internal Medicine

## 2014-06-27 ENCOUNTER — Telehealth: Payer: Self-pay

## 2014-06-27 ENCOUNTER — Non-Acute Institutional Stay: Payer: Medicare Other | Admitting: Internal Medicine

## 2014-06-27 VITALS — BP 134/60 | HR 72 | Temp 97.4°F | Ht 61.0 in | Wt 157.0 lb

## 2014-06-27 DIAGNOSIS — H9193 Unspecified hearing loss, bilateral: Secondary | ICD-10-CM | POA: Diagnosis not present

## 2014-06-27 DIAGNOSIS — G8929 Other chronic pain: Secondary | ICD-10-CM

## 2014-06-27 DIAGNOSIS — D329 Benign neoplasm of meninges, unspecified: Secondary | ICD-10-CM | POA: Diagnosis not present

## 2014-06-27 DIAGNOSIS — I10 Essential (primary) hypertension: Secondary | ICD-10-CM | POA: Diagnosis not present

## 2014-06-27 DIAGNOSIS — M545 Low back pain, unspecified: Secondary | ICD-10-CM | POA: Insufficient documentation

## 2014-06-27 DIAGNOSIS — J309 Allergic rhinitis, unspecified: Secondary | ICD-10-CM | POA: Diagnosis not present

## 2014-06-27 DIAGNOSIS — G4700 Insomnia, unspecified: Secondary | ICD-10-CM

## 2014-06-27 DIAGNOSIS — I82402 Acute embolism and thrombosis of unspecified deep veins of left lower extremity: Secondary | ICD-10-CM | POA: Diagnosis not present

## 2014-06-27 DIAGNOSIS — K219 Gastro-esophageal reflux disease without esophagitis: Secondary | ICD-10-CM

## 2014-06-27 DIAGNOSIS — K5900 Constipation, unspecified: Secondary | ICD-10-CM

## 2014-06-27 DIAGNOSIS — H9190 Unspecified hearing loss, unspecified ear: Secondary | ICD-10-CM | POA: Insufficient documentation

## 2014-06-27 DIAGNOSIS — R413 Other amnesia: Secondary | ICD-10-CM

## 2014-06-27 DIAGNOSIS — E785 Hyperlipidemia, unspecified: Secondary | ICD-10-CM

## 2014-06-27 DIAGNOSIS — G309 Alzheimer's disease, unspecified: Secondary | ICD-10-CM

## 2014-06-27 DIAGNOSIS — R739 Hyperglycemia, unspecified: Secondary | ICD-10-CM

## 2014-06-27 DIAGNOSIS — F0280 Dementia in other diseases classified elsewhere without behavioral disturbance: Secondary | ICD-10-CM | POA: Insufficient documentation

## 2014-06-27 DIAGNOSIS — K5901 Slow transit constipation: Secondary | ICD-10-CM

## 2014-06-27 HISTORY — DX: Insomnia, unspecified: G47.00

## 2014-06-27 HISTORY — DX: Hyperglycemia, unspecified: R73.9

## 2014-06-27 HISTORY — DX: Constipation, unspecified: K59.00

## 2014-06-27 HISTORY — DX: Hyperlipidemia, unspecified: E78.5

## 2014-06-27 HISTORY — DX: Other amnesia: R41.3

## 2014-06-27 MED ORDER — AMLODIPINE-ATORVASTATIN 5-10 MG PO TABS: ORAL_TABLET | ORAL | Status: DC

## 2014-06-27 NOTE — Telephone Encounter (Signed)
Before leaving clinic son brought pill bottles to me needs refill on Amlodipine. Notice name on bottle different than what was on the list. Bottle Amlodipine-Atorvastatin 5-10 mg one daily. Change it on medication list and fax to [facility] .

## 2014-06-27 NOTE — Progress Notes (Signed)
Patient ID: Kayla Weaver, female   DOB: 04-04-1922, 81 y.o.   MRN: [id number]    HISTORY AND PHYSICAL  Location:  [place] of Service: Clinic (12)   Extended Emergency Contact Information Primary Emergency Contact: Ronita, Hargreaves Address: [place], [place] Phone: [phone] Mobile Phone: [phone] Relation: Son  Advanced Directive information Does patient have an advance directive?: Yes, Type of Advance Directive: Healthcare Power of [place];Living will  Chief Complaint  Patient presents with  . Medical Management of Chronic Issues    New Patient - switching doctors, move to [facility] 12/05/10. Low back hurts when she walks a lot.  Here with son [name]    HPI:  Patient presents today for new patient evaluation. She is here with her son. She has no significant complaints other than a chronic and recurrent low back discomfort when she walks. Although there are significant memory issues, she does not seem to be particularly aware of this problem. She has stopped driving.  1. Essential hypertension Controlled  2. Gastroesophageal reflux disease without esophagitis Asymptomatic  3. Chronic low back pain Comes and goes per patient  4. Hearing loss, bilateral Bilateral hearing aids  5. Meningioma Selected in 2000 by Dr. Kristeen Miss  6. Left leg DVT Although neither the patient or her son were aware of why she takes warfarin, it appears that she has had a chronic DVT since about 2008 in the left leg. There are presently at least 3 ultrasound studies of the left leg, all showing noncompressible clot in the left leg. She wears compression stockings daily and has been on warfarin for several years.  7. Allergic rhinitis, unspecified allergic rhinitis type Using fluticasone nasal spray and ceroid eyedrops.   Past Medical History  Diagnosis Date  . Meningioma 2000    Dr. Ellene Route  . Carpal tunnel  syndrome 1980  . Hypertension   . GERD (gastroesophageal reflux disease)   . Allergic rhinitis   . Osteopenia   . Asthma   . Chronic low back pain   . Hearing loss     Hearing aids  . Left leg DVT     chronic since 2008  . Memory loss 06/27/2014  . Hyperglycemia 06/27/2014  . Insomnia 06/27/2014  . Hyperlipidemia 06/27/2014  . Constipation 06/27/2014    Past Surgical History  Procedure Laterality Date  . Brain tumor excision  2000    Dr. Ellene Route  . Carpal tunnel release  1980  . Back surgery  1999    ruptured disk  . Tear duct probing Bilateral 2014    [place]    Patient Care Team: Estill Dooms, MD as PCP - General (Internal Medicine) Roger Shelter, MD (Anesthesiology) Estill Dooms, MD as Consulting Physician (Internal Medicine) Man Mast X, NP as Nurse Practitioner (Nurse Practitioner) Kristeen Miss, MD as Consulting Physician (Neurosurgery) Shon Hough, MD as Consulting Physician (Ophthalmology)  History   Social History  . Marital Status: Unknown    Spouse Name: N/A  . Number of Children: N/A  . Years of Education: N/A   Occupational History  . housewife    Social History Main Topics  . Smoking status: Never Smoker   . Smokeless tobacco: Never Used  . Alcohol Use: No  . Drug Use: No  . Sexual Activity: No   Other Topics Concern  . Not on file   Social  History Narrative   Lives at [facility] since 12/05/2010   Widowed   Never smoked   Alcohol none   Exercise -walking daily   Living Will, POA     reports that she has never smoked. She has never used smokeless tobacco. She reports that she does not drink alcohol or use illicit drugs.  Family History  Problem Relation Age of Onset  . CVA Mother   . Cancer Father     prostate    Family Status  Relation Status Death Age  . Mother Deceased 26  . Father Deceased 76  . Sister Alive   . Son Alive   . Son Alive     Immunization History  Administered Date(s) Administered  .  Influenza-Unspecified 10/20/2013  . PPD Test 10/10/2013  . Pneumococcal Polysaccharide-23 02/06/2009  . Td 10/11/2010  . Zoster 01/08/2012    Allergies no known allergies  Medications: Patient's Medications  New Prescriptions   No medications on file  Previous Medications   AMLODIPINE (NORVASC) 5 MG TABLET    Take 5 mg by mouth. Take one tablet daily for blood pressure   ATORVASTATIN (LIPITOR) 10 MG TABLET    Take 10 mg by mouth daily. Take one tablet daily for cholesterol   CLONAZEPAM (KLONOPIN) 0.5 MG TABLET    Take 0.5 mg by mouth. Take one tablet at bedtime as needed for sleep   DOCUSATE SODIUM (COLACE) 100 MG CAPSULE    Take 100 mg by mouth. Take once daily in morning for constipation   FLUTICASONE (VERAMYST) 27.5 MCG/SPRAY NASAL SPRAY    Place 1 spray into the nose. Twice daily   LISINOPRIL-HYDROCHLOROTHIAZIDE (PRINZIDE,ZESTORETIC) 10-12.5 MG PER TABLET    Take 1 tablet by mouth. Take one tablet daily in the morning  for blood pressure   LORATADINE (ALLERGY RELIEF) 10 MG TABLET    Take 10 mg by mouth daily. Take one daily   MULTIPLE VITAMINS-MINERALS (ICAPS) CAPS    Take by mouth. Take one tablet twice daily vitamin for eyes   WARFARIN (COUMADIN) 5 MG TABLET    Take 5 mg by mouth. Take one 5 mg daily except Mon & Wed take 7 mg  in evening for blood thinner  Modified Medications   No medications on file  Discontinued Medications   DONEPEZIL (ARICEPT) 10 MG TABLET    Take 10 mg by mouth. Take one tablet at bedtime for memory    Review of Systems  Constitutional: Negative for fever, chills, diaphoresis, activity change, appetite change, fatigue and unexpected weight change.  HENT: Positive for hearing loss (Bilateral hearing aids). Negative for congestion, ear discharge, ear pain, postnasal drip, rhinorrhea, sore throat, tinnitus, trouble swallowing and voice change.        History of allergic rhinitis and conjunctivitis  Eyes: Negative for pain, redness, itching and visual  disturbance.       History of dry eyes  Respiratory: Negative for cough, choking, shortness of breath and wheezing.   Cardiovascular: Positive for leg swelling (History left DVT. Wears compression stockings bilaterally daily.). Negative for chest pain and palpitations.  Gastrointestinal: Positive for constipation. Negative for nausea, abdominal pain, diarrhea and abdominal distention.  Endocrine: Negative for cold intolerance, heat intolerance, polydipsia, polyphagia and polyuria.       History of hyperglycemia. Previously on metformin, but this was discontinued at some time over the last year because blood sugars began running normal.  Genitourinary: Negative for dysuria, urgency, frequency, hematuria, flank pain, vaginal discharge, difficulty  urinating and pelvic pain.  Musculoskeletal: Positive for back pain and gait problem (Unstable. Uses cane.). Negative for myalgias, arthralgias, neck pain and neck stiffness.  Skin: Negative for color change, pallor and rash.  Allergic/Immunologic: Negative.   Neurological: Negative for dizziness, tremors, seizures, syncope, weakness, numbness and headaches.       Meningioma partially resected in 2000. Continues to residual meningioma at the base of the brain. Denies headaches or history of seizures. History of progressive memory loss. Had been using Aricept, but this was stopped by the patient, because she could not see that it was making any difference.  Hematological: Negative for adenopathy. Does not bruise/bleed easily.  Psychiatric/Behavioral: Positive for confusion and decreased concentration. Negative for suicidal ideas, hallucinations, behavioral problems, sleep disturbance, dysphoric mood and agitation. The patient is not nervous/anxious and is not hyperactive.     Filed Vitals:   06/27/14 1014  BP: 134/60  Pulse: 72  Temp: 97.4 F (36.3 C)  TempSrc: Oral  Height: 5\' 1"  (1.549 m)  Weight: 157 lb (71.215 kg)  SpO2: 95%   Body mass index  is 29.68 kg/(m^2).  Physical Exam  Constitutional: She is oriented to person, place, and time. She appears well-developed and well-nourished. No distress.  HENT:  Right Ear: External ear normal.  Left Ear: External ear normal.  Nose: Nose normal.  Mouth/Throat: Oropharynx is clear and moist. No oropharyngeal exudate.  Loss of hearing and use of hearing aids bilaterally.  Eyes: Conjunctivae and EOM are normal. Pupils are equal, round, and reactive to light. No scleral icterus.  Neck: No JVD present. No tracheal deviation present. No thyromegaly present.  Cardiovascular: Normal rate, regular rhythm, normal heart sounds and intact distal pulses.  Exam reveals no gallop and no friction rub.   No murmur heard. Pulmonary/Chest: Effort normal. No respiratory distress. She has no wheezes. She has no rales. She exhibits no tenderness.  Abdominal: She exhibits no distension and no mass. There is no tenderness.  Musculoskeletal: Normal range of motion. She exhibits no edema or tenderness.  Mild gait instability.  Lymphadenopathy:    She has no cervical adenopathy.  Neurological: She is alert and oriented to person, place, and time. No cranial nerve deficit. Coordination normal.  Poor short-term recall.  Skin: No rash noted. She is not diaphoretic. No erythema. No pallor.  Psychiatric: She has a normal mood and affect. Her behavior is normal. Judgment and thought content normal.     Labs reviewed: No visits with results within 3 Month(s) from this visit. Latest known visit with results is:  Admission on 02/11/2010, Discharged on 02/11/2010  Component Date Value Ref Range Status  . Glucose-Capillary 02/11/2010 118* 70 - 99 mg/dL Final  . Comment 1 02/11/2010 Documented in Chart   Final  . Comment 2 02/11/2010 Notify RN   Final  . Color, Urine 02/11/2010 YELLOW  YELLOW Final  . APPearance 02/11/2010 TURBID* CLEAR Final  . Specific Gravity, Urine 02/11/2010 1.017  1.005 - 1.030 Final  . pH  02/11/2010 8.0  5.0 - 8.0 Final  . Urine Glucose, Fasting 02/11/2010 NEGATIVE  NEGATIVE mg/dL Final  . Hgb urine dipstick 02/11/2010 NEGATIVE  NEGATIVE Final  . Bilirubin Urine 02/11/2010 NEGATIVE  NEGATIVE Final  . Ketones, ur 02/11/2010 TRACE* NEGATIVE mg/dL Final  . Protein, ur 02/11/2010 30* NEGATIVE mg/dL Final  . Urobilinogen, UA 02/11/2010 0.2  0.0 - 1.0 mg/dL Final  . Nitrite 02/11/2010 NEGATIVE  NEGATIVE Final  . Leukocytes, UA 02/11/2010 NEGATIVE  NEGATIVE Final  .  Squamous Epithelial / LPF 02/11/2010 RARE  RARE Final  . WBC, UA 02/11/2010 3-6  <3 WBC/hpf Final  . Bacteria, UA 02/11/2010 FEW* RARE Final  . Crystals 02/11/2010 AMORPHOUS PHOSPHATES* NEGATIVE Final  . Urine-Other 02/11/2010 MUCOUS PRESENT   Final  . Sodium 02/11/2010 140  135 - 145 mEq/L Final  . Potassium 02/11/2010 3.8  3.5 - 5.1 mEq/L Final  . Chloride 02/11/2010 100  96 - 112 mEq/L Final  . BUN 02/11/2010 24* 6 - 23 mg/dL Final  . Creatinine, Ser 02/11/2010 1.1  0.4 - 1.2 mg/dL Final  . Glucose, Bld 02/11/2010 166* 70 - 99 mg/dL Final  . Calcium, Ion 02/11/2010 1.15  1.12 - 1.32 mmol/L Final  . TCO2 02/11/2010 30  0 - 100 mmol/L Final  . Hemoglobin 02/11/2010 12.9  12.0 - 15.0 g/dL Final  . HCT 02/11/2010 38.0  36.0 - 46.0 % Final    US Venous Img Lower Bilateral  03/07/2010   *RADIOLOGY REPORT*  Clinical Data: Bilateral lower extremity pain and swelling.  VENOUS DUPLEX ULTRASOUND OF BILATERAL LOWER EXTREMITIES  Technique:  Gray-scale sonography with graded compression, as well as color Doppler and duplex ultrasound, were performed to evaluate the deep venous system of both lower extremities from the level of the common femoral vein through the popliteal and proximal calf veins.  Spectral Doppler was utilized to evaluate flow at rest and with distal augmentation maneuvers.  Comparison:  Prior left lower extremity Doppler examination 03/12/2009.  Findings: The right lower extremities demonstrates patent flow and  complete compressibility of the common femoral, profunda femoral, superficial femoral and popliteal veins.  The left lower extremity demonstrates patent flow and compressibility of the common femoral and profunda femoral veins. The superficial femoral, popliteal and posterior tibialis veins demonstrate partially compressible nonocclusive clot.  IMPRESSION:  1.  Partially compressible nonocclusive clot in the left superficial femoral, popliteal and posterior tibial veins. 2.  Negative venous Doppler examination for deep venous thrombosis in the right lower extremity.  Original Report Authenticated By: P. Kalman Jewels, M.D.     Assessment/Plan  1. Essential hypertension controlled  2. Gastroesophageal reflux disease without esophagitis Asymptomatic  3. Chronic low back pain Patient does not believe anything needs to be done in particular about this. She has back brace is to use if she desires. Pain does not seem to be disabling at this point.  4. Hearing loss, bilateral Continue to use hearing aids.  5. Meningioma Resected in 2000 without residual problems, although x-ray study showed there is some residual meningioma at the base of the brain.  6. Left leg DVT Appears to been a problem several years ago. Discontinued warfarin.  7. Allergic rhinitis, unspecified allergic rhinitis type Continue current OTC medicines  8. Memory loss MMSE next visit  9. Hyperglycemia CMP next visit  10. Insomnia Continue clonazepam for now  11. Hyperlipidemia Recheck lipid panel prior to next visit  12. Slow transit constipation Continue Colace

## 2014-06-28 NOTE — Addendum Note (Signed)
Addended by: Estill Dooms on: 06/28/2014 09:53 PM   Modules accepted: Level of Service

## 2014-09-28 DIAGNOSIS — E785 Hyperlipidemia, unspecified: Secondary | ICD-10-CM | POA: Diagnosis not present

## 2014-09-28 DIAGNOSIS — I10 Essential (primary) hypertension: Secondary | ICD-10-CM | POA: Diagnosis not present

## 2014-09-28 LAB — LIPID PANEL
CHOLESTEROL: 137 mg/dL (ref 0–200)
HDL: 39 mg/dL (ref 35–70)
LDL CALC: 65 mg/dL
Triglycerides: 163 mg/dL — AB (ref 40–160)

## 2014-09-28 LAB — HEPATIC FUNCTION PANEL
ALT: 11 U/L (ref 7–35)
AST: 15 U/L (ref 13–35)
Alkaline Phosphatase: 57 U/L (ref 25–125)

## 2014-09-28 LAB — BASIC METABOLIC PANEL
BUN: 30 mg/dL — AB (ref 4–21)
Creatinine: 1.2 mg/dL — AB (ref 0.5–1.1)
GLUCOSE: 128 mg/dL
Potassium: 4.4 mmol/L (ref 3.4–5.3)
Sodium: 140 mmol/L (ref 137–147)

## 2014-09-29 ENCOUNTER — Encounter: Payer: Self-pay | Admitting: *Deleted

## 2014-10-03 ENCOUNTER — Encounter: Payer: Self-pay | Admitting: Internal Medicine

## 2014-10-03 ENCOUNTER — Non-Acute Institutional Stay: Payer: Medicare Other | Admitting: Internal Medicine

## 2014-10-03 VITALS — BP 118/62 | HR 80 | Temp 97.4°F | Resp 18 | Ht 61.0 in | Wt 154.0 lb

## 2014-10-03 DIAGNOSIS — R739 Hyperglycemia, unspecified: Secondary | ICD-10-CM | POA: Diagnosis not present

## 2014-10-03 DIAGNOSIS — E785 Hyperlipidemia, unspecified: Secondary | ICD-10-CM

## 2014-10-03 DIAGNOSIS — R413 Other amnesia: Secondary | ICD-10-CM | POA: Diagnosis not present

## 2014-10-03 DIAGNOSIS — I10 Essential (primary) hypertension: Secondary | ICD-10-CM | POA: Diagnosis not present

## 2014-10-03 MED ORDER — DONEPEZIL HCL 5 MG PO TABS: ORAL_TABLET | ORAL | Status: DC

## 2014-10-03 NOTE — Progress Notes (Signed)
Patient ID: Kayla Weaver, female   DOB: 05-06-1922, 81 y.o.   MRN: [id number]    [facility]     Place of Service: Clinic (12)     No Known Allergies  Chief Complaint  Patient presents with  . Medical Management of Chronic Issues    HPI:  Essential hypertension - well controlled  Hyperglycemia -continues with modest hyperglycemia glucose 128 mg percent  Hyperlipidemia - controlled  Memory loss - MMSE 25/30. Passed clock drawing. Son is concerned about memory. Patient is unaware of the memory loss. Family is arranging her medications for her.    Medications: Patient's Medications  New Prescriptions   No medications on file  Previous Medications   AMLODIPINE-ATORVASTATIN (CADUET) 5-10 MG PER TABLET    Take one tablet daily for blood pressure   ATORVASTATIN (LIPITOR) 10 MG TABLET    Take 10 mg by mouth daily. Take one tablet daily for cholesterol   CLONAZEPAM (KLONOPIN) 0.5 MG TABLET    Take 0.5 mg by mouth. Take one tablet at bedtime as needed for sleep   DOCUSATE SODIUM (COLACE) 100 MG CAPSULE    Take 100 mg by mouth. Take once daily in morning for constipation   FLUTICASONE (VERAMYST) 27.5 MCG/SPRAY NASAL SPRAY    Place 1 spray into the nose. Twice daily   LISINOPRIL-HYDROCHLOROTHIAZIDE (PRINZIDE,ZESTORETIC) 10-12.5 MG PER TABLET    Take 1 tablet by mouth. Take one tablet daily in the morning  for blood pressure   LORATADINE (ALLERGY RELIEF) 10 MG TABLET    Take 10 mg by mouth daily. Take one daily   MULTIPLE VITAMINS-MINERALS (ICAPS) CAPS    Take by mouth. Take one tablet twice daily vitamin for eyes   WARFARIN (COUMADIN) 5 MG TABLET    Take 5 mg by mouth. Take one 5 mg daily except Mon & Wed take 7 mg  in evening for blood thinner  Modified Medications   No medications on file  Discontinued Medications   No medications on file     Review of Systems  Constitutional: Negative for fever, chills, diaphoresis, activity change, appetite change, fatigue  and unexpected weight change.  HENT: Positive for hearing loss (Bilateral hearing aids). Negative for congestion, ear discharge, ear pain, postnasal drip, rhinorrhea, sore throat, tinnitus, trouble swallowing and voice change.        History of allergic rhinitis and conjunctivitis  Eyes: Negative for pain, redness, itching and visual disturbance.       History of dry eyes  Respiratory: Negative for cough, choking, shortness of breath and wheezing.   Cardiovascular: Positive for leg swelling (History left DVT. Wears compression stockings bilaterally daily.). Negative for chest pain and palpitations.  Gastrointestinal: Positive for constipation. Negative for nausea, abdominal pain, diarrhea and abdominal distention.  Endocrine: Negative for cold intolerance, heat intolerance, polydipsia, polyphagia and polyuria.       History of hyperglycemia. Previously on metformin, but this was discontinued at some time over the last year because blood sugars began running normal.  Genitourinary: Negative for dysuria, urgency, frequency, hematuria, flank pain, vaginal discharge, difficulty urinating and pelvic pain.  Musculoskeletal: Positive for back pain and gait problem (Unstable. Uses cane.). Negative for myalgias, arthralgias, neck pain and neck stiffness.  Skin: Negative for color change, pallor and rash.  Allergic/Immunologic: Negative.   Neurological: Negative for dizziness, tremors, seizures, syncope, weakness, numbness and headaches.       Meningioma partially resected in 2000. Continues to residual meningioma at the base of the  brain. Denies headaches or history of seizures. History of progressive memory loss. Had been using Aricept, but this was stopped by the patient, because she could not see that it was making any difference.  Hematological: Negative for adenopathy. Does not bruise/bleed easily.  Psychiatric/Behavioral: Positive for confusion and decreased concentration. Negative for suicidal ideas,  hallucinations, behavioral problems, sleep disturbance, dysphoric mood and agitation. The patient is not nervous/anxious and is not hyperactive.     Filed Vitals:   10/03/14 0912  BP: 118/62  Pulse: 80  Temp: 97.4 F (36.3 C)  TempSrc: Oral  Resp: 18  Height: _0  (1.549 m)  Weight: 154 lb (69.854 kg)  SpO2: 94%   Body mass index is 29.11 kg/(m^2).  Physical Exam  Constitutional: She is oriented to person, place, and time. She appears well-developed and well-nourished. No distress.  HENT:  Right Ear: External ear normal.  Left Ear: External ear normal.  Nose: Nose normal.  Mouth/Throat: Oropharynx is clear and moist. No oropharyngeal exudate.  Loss of hearing and use of hearing aids bilaterally.  Eyes: Conjunctivae and EOM are normal. Pupils are equal, round, and reactive to light. No scleral icterus.  Neck: No JVD present. No tracheal deviation present. No thyromegaly present.  Cardiovascular: Normal rate, regular rhythm, normal heart sounds and intact distal pulses.  Exam reveals no gallop and no friction rub.   No murmur heard. Pulmonary/Chest: Effort normal. No respiratory distress. She has no wheezes. She has no rales. She exhibits no tenderness.  Abdominal: She exhibits no distension and no mass. There is no tenderness.  Musculoskeletal: Normal range of motion. She exhibits no edema or tenderness.  Mild gait instability.  Lymphadenopathy:    She has no cervical adenopathy.  Neurological: She is alert and oriented to person, place, and time. No cranial nerve deficit. Coordination normal.  Poor short-term recall. 10/03/2014 MMSE 25/30. Passed clock drawing.  Skin: No rash noted. She is not diaphoretic. No erythema. No pallor.  Psychiatric: She has a normal mood and affect. Her behavior is normal. Judgment and thought content normal.     Labs reviewed: Lab Summary Latest Ref Rng 09/28/2014 02/11/2010 03/12/2009  Hemoglobin 12.0 - 15.0 g/dL (None) 12.9 13.5  Hematocrit  36.0 - 46.0 % (None) 38.0 38.6  White count 4.0 - 10.5 K/uL (None) (None) 6.8  Platelet count 150 - 400 K/uL (None) (None) 224  Sodium 137 - 147 mmol/L 140 140 136  Potassium 3.4 - 5.3 mmol/L 4.4 3.8 3.9  Calcium 8.4 - 10.5 mg/dL (None) (None) 9.2  Phosphorus - (None) (None) (None)  Creatinine 0.5 - 1.1 mg/dL 1.2(A) 1.1 0.94  AST 13 - 35 U/L 15 (None) 24  Alk Phos 25 - 125 U/L 57 (None) 49  Bilirubin 0.3 - 1.2 mg/dL (None) (None) 0.6  Glucose - 128 166(H) 114(H)  Cholesterol 0 - 200 mg/dL 137 (None) (None)  HDL cholesterol 35 - 70 mg/dL 39 (None) (None)  Triglycerides 40 - 160 mg/dL 163(A) (None) (None)  LDL Direct - (None) (None) (None)  LDL Calc - 65 (None) (None)  Total protein 6.0 - 8.3 g/dL (None) (None) 6.9  Albumin 3.5 - 5.2 g/dL (None) (None) 4.0   No results found for: TSH, T3TOTAL, T4TOTAL, THYROIDAB Lab Results  Component Value Date   BUN 30* 09/28/2014   No results found for: HGBA1C     Assessment/Plan  1. Essential hypertension Continue current medication  2. Hyperglycemia  Continue to monitor. No medications.  3. Hyponatremia Continue to  monitor   4. Memory loss - donepezil (ARICEPT) 5 MG tablet; One nightly to help preserve memory  Dispense: 28 tablet; Refill: 0 -Increase to 10 mg nightly after 28 days.

## 2014-10-03 NOTE — Progress Notes (Signed)
Passed clock test 

## 2014-10-10 ENCOUNTER — Encounter: Payer: Self-pay | Admitting: Internal Medicine

## 2014-10-10 ENCOUNTER — Non-Acute Institutional Stay: Payer: Medicare Other | Admitting: Internal Medicine

## 2014-10-10 VITALS — BP 112/60 | HR 67 | Temp 97.4°F | Resp 20 | Ht 61.0 in | Wt 153.4 lb

## 2014-10-10 DIAGNOSIS — R413 Other amnesia: Secondary | ICD-10-CM | POA: Diagnosis not present

## 2014-10-10 DIAGNOSIS — B029 Zoster without complications: Secondary | ICD-10-CM | POA: Diagnosis not present

## 2014-10-10 MED ORDER — VALACYCLOVIR HCL 1 G PO TABS
1000.0000 mg | ORAL_TABLET | Freq: Two times a day (BID) | ORAL | Status: DC
Start: 2014-10-10 — End: 2014-12-05

## 2014-10-10 NOTE — Progress Notes (Signed)
Patient ID: Kayla Weaver, female   DOB: 10-05-1922, 81 y.o.   MRN: [id number]    [facility]     Place of Service: Clinic (12)     No Known Allergies  Chief Complaint  Patient presents with  . Medical Management of Chronic Issues  . Acute Visit    red area on left side of neck x 3 yrs    HPI:  Herpes zoster - blistered area along the left neck with additional clustered lesions at the base of the neck. It does not extend down the left arm. There are no facial lesions. There is some burning discomfort but it is mild. Patient is quite unclear about how long these lesions have been present. They look relatively fresh, blistered in clusters, and mildly tender. She says they might of been there as long as 3 years. This seems unlikely.  Memory loss - tolerating Aricept. Has a scheduled return date for 12/05/2014.    Medications: Patient's Medications  New Prescriptions   No medications on file  Previous Medications   ALREX 0.2 % SUSP       AMLODIPINE-ATORVASTATIN (CADUET) 5-10 MG PER TABLET    Take one tablet daily for blood pressure   ATORVASTATIN (LIPITOR) 10 MG TABLET    Take 10 mg by mouth daily. Take one tablet daily for cholesterol   CLONAZEPAM (KLONOPIN) 0.5 MG TABLET    Take 0.5 mg by mouth. Take one tablet at bedtime as needed for sleep   DOCUSATE SODIUM (COLACE) 100 MG CAPSULE    Take 100 mg by mouth. Take once daily in morning for constipation   FLUTICASONE (VERAMYST) 27.5 MCG/SPRAY NASAL SPRAY    Place 1 spray into the nose. Twice daily   LISINOPRIL-HYDROCHLOROTHIAZIDE (PRINZIDE,ZESTORETIC) 10-12.5 MG PER TABLET    Take 1 tablet by mouth. Take one tablet daily in the morning  for blood pressure   LORATADINE (ALLERGY RELIEF) 10 MG TABLET    Take 10 mg by mouth daily. Take one daily   MULTIPLE VITAMINS-MINERALS (ICAPS) CAPS    Take by mouth. Take one tablet twice daily vitamin for eyes  Modified Medications   No medications on file  Discontinued  Medications   DONEPEZIL (ARICEPT) 5 MG TABLET    One nightly to help preserve memory   WARFARIN (COUMADIN) 5 MG TABLET    Take 5 mg by mouth. Take one 5 mg daily except Mon & Wed take 7 mg  in evening for blood thinner     Review of Systems  Constitutional: Negative for fever, chills, diaphoresis, activity change, appetite change, fatigue and unexpected weight change.  HENT: Positive for hearing loss (Bilateral hearing aids). Negative for congestion, ear discharge, ear pain, postnasal drip, rhinorrhea, sore throat, tinnitus, trouble swallowing and voice change.        History of allergic rhinitis and conjunctivitis  Eyes: Negative for pain, redness, itching and visual disturbance.       History of dry eyes  Respiratory: Negative for cough, choking, shortness of breath and wheezing.   Cardiovascular: Positive for leg swelling (History left DVT. Wears compression stockings bilaterally daily.). Negative for chest pain and palpitations.  Gastrointestinal: Positive for constipation. Negative for nausea, abdominal pain, diarrhea and abdominal distention.  Endocrine: Negative for cold intolerance, heat intolerance, polydipsia, polyphagia and polyuria.       History of hyperglycemia. Previously on metformin, but this was discontinued at some time over the last year because blood sugars began running normal.  Genitourinary:  Negative for dysuria, urgency, frequency, hematuria, flank pain, vaginal discharge, difficulty urinating and pelvic pain.  Musculoskeletal: Positive for back pain and gait problem (Unstable. Uses cane.). Negative for myalgias, arthralgias, neck pain and neck stiffness.  Skin: Positive for rash (leeft side of the neck and left nape of neck). Negative for color change and pallor.  Allergic/Immunologic: Negative.   Neurological: Negative for dizziness, tremors, seizures, syncope, weakness, numbness and headaches.       Meningioma partially resected in 2000. Continues to residual  meningioma at the base of the brain. Denies headaches or history of seizures. History of progressive memory loss. Had been using Aricept, but this was stopped by the patient, because she could not see that it was making any difference.  Hematological: Negative for adenopathy. Does not bruise/bleed easily.  Psychiatric/Behavioral: Positive for confusion and decreased concentration. Negative for suicidal ideas, hallucinations, behavioral problems, sleep disturbance, dysphoric mood and agitation. The patient is not nervous/anxious and is not hyperactive.     Filed Vitals:   10/10/14 1115  BP: 112/60  Pulse: 67  Temp: 97.4 F (36.3 C)  TempSrc: Oral  Resp: 20  Height: 5' 1"  (1.549 m)  Weight: 153 lb 6.4 oz (69.582 kg)  SpO2: 97%   Body mass index is 29 kg/(m^2).  Physical Exam  Constitutional: She is oriented to person, place, and time. She appears well-developed and well-nourished. No distress.  HENT:  Right Ear: External ear normal.  Left Ear: External ear normal.  Nose: Nose normal.  Mouth/Throat: Oropharynx is clear and moist. No oropharyngeal exudate.  Loss of hearing and use of hearing aids bilaterally.  Eyes: Conjunctivae and EOM are normal. Pupils are equal, round, and reactive to light. No scleral icterus.  Neck: No JVD present. No tracheal deviation present. No thyromegaly present.  Cardiovascular: Normal rate, regular rhythm, normal heart sounds and intact distal pulses.  Exam reveals no gallop and no friction rub.   No murmur heard. Pulmonary/Chest: Effort normal. No respiratory distress. She has no wheezes. She has no rales. She exhibits no tenderness.  Abdominal: She exhibits no distension and no mass. There is no tenderness.  Musculoskeletal: Normal range of motion. She exhibits no edema or tenderness.  Mild gait instability.  Lymphadenopathy:    She has no cervical adenopathy.  Neurological: She is alert and oriented to person, place, and time. No cranial nerve  deficit. Coordination normal.  Poor short-term recall. 10/03/2014 MMSE 25/30. Passed clock drawing.  Skin: No rash noted. She is not diaphoretic. No erythema. No pallor.  Herpetic appearing lesions on the left neck and left nape of the neck.  Psychiatric: She has a normal mood and affect. Her behavior is normal. Judgment and thought content normal.     Labs reviewed: Lab Summary Latest Ref Rng 09/28/2014 02/11/2010 03/12/2009  Hemoglobin 12.0 - 15.0 g/dL (None) 12.9 13.5  Hematocrit 36.0 - 46.0 % (None) 38.0 38.6  White count 4.0 - 10.5 K/uL (None) (None) 6.8  Platelet count 150 - 400 K/uL (None) (None) 224  Sodium 137 - 147 mmol/L 140 140 136  Potassium 3.4 - 5.3 mmol/L 4.4 3.8 3.9  Calcium 8.4 - 10.5 mg/dL (None) (None) 9.2  Phosphorus - (None) (None) (None)  Creatinine 0.5 - 1.1 mg/dL 1.2(A) 1.1 0.94  AST 13 - 35 U/L 15 (None) 24  Alk Phos 25 - 125 U/L 57 (None) 49  Bilirubin 0.3 - 1.2 mg/dL (None) (None) 0.6  Glucose - 128 166(H) 114(H)  Cholesterol 0 - 200 mg/dL  137 (None) (None)  HDL cholesterol 35 - 70 mg/dL 39 (None) (None)  Triglycerides 40 - 160 mg/dL 163(A) (None) (None)  LDL Direct - (None) (None) (None)  LDL Calc - 65 (None) (None)  Total protein 6.0 - 8.3 g/dL (None) (None) 6.9  Albumin 3.5 - 5.2 g/dL (None) (None) 4.0   No results found for: TSH, T3TOTAL, T4TOTAL, THYROIDAB Lab Results  Component Value Date   BUN 30* 09/28/2014   No results found for: HGBA1C     Assessment/Plan   1. Herpes zoster Clusters of vesicles at left neck - valACYclovir (VALTREX) 1000 MG tablet; Take 1 tablet (1,000 mg total) by mouth 2 (two) times daily.  Dispense: 20 tablet; Refill: 0  2. Memory loss Tolerating donepezil

## 2014-10-13 ENCOUNTER — Other Ambulatory Visit: Payer: Self-pay | Admitting: *Deleted

## 2014-10-13 MED ORDER — DONEPEZIL HCL 5 MG PO TABS: ORAL_TABLET | ORAL | Status: DC

## 2014-10-13 NOTE — Telephone Encounter (Signed)
Joann, Caregiver called and requested refill to be faxed to pharmacy.

## 2014-10-24 ENCOUNTER — Encounter: Payer: Self-pay | Admitting: Internal Medicine

## 2014-10-31 ENCOUNTER — Other Ambulatory Visit: Payer: Self-pay | Admitting: *Deleted

## 2014-10-31 MED ORDER — CLONAZEPAM 0.5 MG PO TABS: ORAL_TABLET | ORAL | Status: DC

## 2014-10-31 NOTE — Telephone Encounter (Signed)
[facility] [address]

## 2014-11-07 ENCOUNTER — Non-Acute Institutional Stay: Payer: Medicare Other | Admitting: Internal Medicine

## 2014-11-07 ENCOUNTER — Encounter: Payer: Self-pay | Admitting: Internal Medicine

## 2014-11-07 VITALS — BP 120/62 | HR 67 | Temp 97.4°F | Resp 20 | Ht 61.0 in | Wt 157.0 lb

## 2014-11-07 DIAGNOSIS — B029 Zoster without complications: Secondary | ICD-10-CM

## 2014-11-07 DIAGNOSIS — R413 Other amnesia: Secondary | ICD-10-CM

## 2014-11-07 DIAGNOSIS — L989 Disorder of the skin and subcutaneous tissue, unspecified: Secondary | ICD-10-CM

## 2014-11-07 MED ORDER — DONEPEZIL HCL 10 MG PO TABS: ORAL_TABLET | ORAL | Status: DC

## 2014-11-07 NOTE — Addendum Note (Signed)
Addended by: Estill Dooms on: 11/07/2014 12:56 PM   Modules accepted: Level of Service

## 2014-11-07 NOTE — Progress Notes (Signed)
Patient ID: Kayla Weaver, female   DOB: 10/05/1922, 81 y.o.   MRN: [id number]    [facility]     [facility] (12[facility]     No Known Allergies  Chief Complaint  Patient presents with  . Acute Visit    red area on left side of neck    HPI:  Mildly erythematous area with multiple cystic lesions as on the left side of the neck at the base and slightly posterior. As noted at the time of the last visit, the patient thinks it may have been there 3 years. That seemed unlikely at the time. I didn't think it was herpes zoster, but now have changed my mind. The lesions are not changed at all since she took the Valtrex. It is mildly tender. It is not a burning lesion. She denies itching. There is no adenopathy.  Memory loss is unchanged. She tolerated the 5 mg donepezil daily. No nausea, tremor, or increased nervousness.  Medications: Patient's Medications  New Prescriptions   No medications on file  Previous Medications   ALREX 0.2 % SUSP       AMLODIPINE-ATORVASTATIN (CADUET) 5-10 MG PER TABLET    Take one tablet daily for blood pressure   ATORVASTATIN (LIPITOR) 10 MG TABLET    Take 10 mg by mouth daily. Take one tablet daily for cholesterol   CLONAZEPAM (KLONOPIN) 0.5 MG TABLET    Take one tablet by mouth at bedtime as needed for sleep   DOCUSATE SODIUM (COLACE) 100 MG CAPSULE    Take 100 mg by mouth. Take once daily in morning for constipation   FLUTICASONE (VERAMYST) 27.5 MCG/SPRAY NASAL SPRAY    Place 1 spray into the nose. Twice daily   LISINOPRIL-HYDROCHLOROTHIAZIDE (PRINZIDE,ZESTORETIC) 10-12.5 MG PER TABLET    Take 1 tablet by mouth. Take one tablet daily in the morning  for blood pressure   LORATADINE (ALLERGY RELIEF) 10 MG TABLET    Take 10 mg by mouth daily. Take one daily   MULTIPLE VITAMINS-MINERALS (ICAPS) CAPS    Take by mouth. Take one tablet twice daily vitamin for eyes   VALACYCLOVIR (VALTREX) 1000 MG TABLET    Take 1 tablet (1,000 mg total) by  mouth 2 (two) times daily.  Modified Medications   No medications on file  Discontinued Medications   DONEPEZIL (ARICEPT) 5 MG TABLET    Take one tablet by mouth once daily to preserve memory     Review of Systems  Constitutional: Negative for fever, chills, diaphoresis, activity change, appetite change, fatigue and unexpected weight change.  HENT: Positive for hearing loss (Bilateral hearing aids). Negative for congestion, ear discharge, ear pain, postnasal drip, rhinorrhea, sore throat, tinnitus, trouble swallowing and voice change.        History of allergic rhinitis and conjunctivitis  Eyes: Negative for pain, redness, itching and visual disturbance.       History of dry eyes  Respiratory: Negative for cough, choking, shortness of breath and wheezing.   Cardiovascular: Positive for leg swelling (History left DVT. Wears compression stockings bilaterally daily.). Negative for chest pain and palpitations.  Gastrointestinal: Positive for constipation. Negative for nausea, abdominal pain, diarrhea and abdominal distention.  Endocrine: Negative for cold intolerance, heat intolerance, polydipsia, polyphagia and polyuria.       History of hyperglycemia. Previously on metformin, but this was discontinued at some time over the last year because blood sugars began running normal.  Genitourinary: Negative for dysuria, urgency, frequency, hematuria, flank   pain, vaginal discharge, difficulty urinating and pelvic pain.  Musculoskeletal: Positive for back pain and gait problem (Unstable. Uses cane.). Negative for myalgias, arthralgias, neck pain and neck stiffness.  Skin: Positive for rash (lefft side of the neck and left nape of neck). Negative for color change and pallor.  Allergic/Immunologic: Negative.   Neurological: Negative for dizziness, tremors, seizures, syncope, weakness, numbness and headaches.       Meningioma partially resected in 2000. Continues to residual meningioma at the base of the  brain. Denies headaches or history of seizures. History of progressive memory loss. Had been using Aricept, but this was stopped by the patient, because she could not see that it was making any difference.  Hematological: Negative for adenopathy. Does not bruise/bleed easily.  Psychiatric/Behavioral: Positive for confusion and decreased concentration. Negative for suicidal ideas, hallucinations, behavioral problems, sleep disturbance, dysphoric mood and agitation. The patient is not nervous/anxious and is not hyperactive.     Filed Vitals:   11/07/14 0951  BP: 120/62  Pulse: 67  Temp: 97.4 F (36.3 C)  TempSrc: Oral  Resp: 20  Height: 5' 1" (1.549 m)  Weight: 157 lb (71.215 kg)  SpO2: 95%   Body mass index is 29.68 kg/(m^2).  Physical Exam  Constitutional: She is oriented to person, place, and time. She appears well-developed and well-nourished. No distress.  HENT:  Right Ear: External ear normal.  Left Ear: External ear normal.  Nose: Nose normal.  Mouth/Throat: Oropharynx is clear and moist. No oropharyngeal exudate.  Loss of hearing and use of hearing aids bilaterally.  Eyes: Conjunctivae and EOM are normal. Pupils are equal, round, and reactive to light. No scleral icterus.  Neck: No JVD present. No tracheal deviation present. No thyromegaly present.  Cardiovascular: Normal rate, regular rhythm, normal heart sounds and intact distal pulses.  Exam reveals no gallop and no friction rub.   No murmur heard. Pulmonary/Chest: Effort normal. No respiratory distress. She has no wheezes. She has no rales. She exhibits no tenderness.  Abdominal: She exhibits no distension and no mass. There is no tenderness.  Musculoskeletal: Normal range of motion. She exhibits no edema or tenderness.  Mild gait instability.  Lymphadenopathy:    She has no cervical adenopathy.  Neurological: She is alert and oriented to person, place, and time. No cranial nerve deficit. Coordination normal.  Poor  short-term recall. 10/03/2014 MMSE 25/30. Passed clock drawing.  Skin: No rash noted. She is not diaphoretic. No erythema. No pallor.  Cystic appearing lesions on the left neck and left nape of the neck.  Psychiatric: She has a normal mood and affect. Her behavior is normal. Judgment and thought content normal.     Labs reviewed: Lab Summary Latest Ref Rng 09/28/2014 02/11/2010 03/12/2009  Hemoglobin 12.0 - 15.0 g/dL (None) 12.9 13.5  Hematocrit 36.0 - 46.0 % (None) 38.0 38.6  White count 4.0 - 10.5 K/uL (None) (None) 6.8  Platelet count 150 - 400 K/uL (None) (None) 224  Sodium 137 - 147 mmol/L 140 140 136  Potassium 3.4 - 5.3 mmol/L 4.4 3.8 3.9  Calcium 8.4 - 10.5 mg/dL (None) (None) 9.2  Phosphorus - (None) (None) (None)  Creatinine 0.5 - 1.1 mg/dL 1.2(A) 1.1 0.94  AST 13 - 35 U/L 15 (None) 24  Alk Phos 25 - 125 U/L 57 (None) 49  Bilirubin 0.3 - 1.2 mg/dL (None) (None) 0.6  Glucose - 128 166(H) 114(H)  Cholesterol 0 - 200 mg/dL 137 (None) (None)  HDL cholesterol 35 - 70  mg/dL 39 (None) (None)  Triglycerides 40 - 160 mg/dL 163(A) (None) (None)  LDL Direct - (None) (None) (None)  LDL Calc - 65 (None) (None)  Total protein 6.0 - 8.3 g/dL (None) (None) 6.9  Albumin 3.5 - 5.2 g/dL (None) (None) 4.0   No results found for: TSH, T3TOTAL, T4TOTAL, THYROIDAB Lab Results  Component Value Date   BUN 30* 09/28/2014   No results found for: HGBA1C     Assessment/Plan  1. Skin lesion Cystic skin lesion left neck -Derm referral  2. Herpes zoster I believe this diagnosis was in error  3. Memory loss - donepezil (ARICEPT) 10 MG tablet; One daily to help preserve memory  Dispense: 90 tablet; Refill: 3

## 2014-12-01 ENCOUNTER — Other Ambulatory Visit: Payer: Self-pay | Admitting: Internal Medicine

## 2014-12-04 ENCOUNTER — Other Ambulatory Visit: Payer: Self-pay | Admitting: *Deleted

## 2014-12-04 ENCOUNTER — Other Ambulatory Visit: Payer: Self-pay | Admitting: Nurse Practitioner

## 2014-12-04 MED ORDER — CLONAZEPAM 0.5 MG PO TABS: 0.5000 mg | ORAL_TABLET | Freq: Every evening | ORAL | Status: DC | PRN

## 2014-12-05 ENCOUNTER — Encounter: Payer: Self-pay | Admitting: Internal Medicine

## 2014-12-05 ENCOUNTER — Non-Acute Institutional Stay: Payer: Medicare Other | Admitting: Internal Medicine

## 2014-12-05 VITALS — BP 142/62 | HR 70 | Temp 97.2°F | Resp 20 | Ht 61.0 in | Wt 155.6 lb

## 2014-12-05 DIAGNOSIS — R413 Other amnesia: Secondary | ICD-10-CM

## 2014-12-05 DIAGNOSIS — L989 Disorder of the skin and subcutaneous tissue, unspecified: Secondary | ICD-10-CM

## 2014-12-05 NOTE — Progress Notes (Signed)
Patient ID: Kayla Weaver, female   DOB: [birth year], 81 y.o.   MRN: [id number]    [facility] [address]     Place of Service: Clinic (12)     No Known Allergies  Chief Complaint  Patient presents with  . Medical Management of Chronic Issues    F/U for skin lesion, memory     HPI:  Skin lesion: Left neck. Seems slightly larger than previously. Is a more solid texture now as compared to the cystic texture that was previously described. Patient has an appointment see dermatologist 12/13/2014.  Memory loss - started on donepezil 10 mg daily. Patient tolerating this dose. No nausea, palpitations, recent anxiety, or sleep disturbance.  Insomnia - patient has been taking clonazepam for a very long time. She misses it some nights and doesn't seem to find a problem with sleep at that point.    Medications: Patient's Medications  New Prescriptions   No medications on file  Previous Medications   ALREX 0.2 % SUSP    1 drop.    AMLODIPINE-ATORVASTATIN (CADUET) 5-10 MG PER TABLET    Take one tablet daily for blood pressure   CLONAZEPAM (KLONOPIN) 0.5 MG TABLET    Take 1 tablet (0.5 mg total) by mouth at bedtime as needed. for sleep   DOCUSATE SODIUM (COLACE) 100 MG CAPSULE    Take 100 mg by mouth. Take once daily in morning for constipation   DONEPEZIL (ARICEPT) 10 MG TABLET    TAKE ONE DAILY TO HELP PRESERVE MEMORY   FLUTICASONE (VERAMYST) 27.5 MCG/SPRAY NASAL SPRAY    Place 1 spray into the nose. Twice daily   LISINOPRIL-HYDROCHLOROTHIAZIDE (PRINZIDE,ZESTORETIC) 10-12.5 MG PER TABLET    Take 1 tablet by mouth. Take one tablet daily in the morning  for blood pressure   LORATADINE (ALLERGY RELIEF) 10 MG TABLET    Take 10 mg by mouth daily. Take one daily   MULTIPLE VITAMINS-MINERALS (ICAPS) CAPS    Take by mouth. Take one tablet twice daily vitamin for eyes  Modified Medications   No medications on file  Discontinued Medications   ATORVASTATIN (LIPITOR) 10 MG TABLET    Take 10  mg by mouth daily. Take one tablet daily for cholesterol   DONEPEZIL (ARICEPT) 10 MG TABLET    One daily to help preserve memory   VALACYCLOVIR (VALTREX) 1000 MG TABLET    Take 1 tablet (1,000 mg total) by mouth 2 (two) times daily.     Review of Systems  Constitutional: Negative for fever, chills, diaphoresis, activity change, appetite change, fatigue and unexpected weight change.  HENT: Positive for hearing loss (Bilateral hearing aids). Negative for congestion, ear discharge, ear pain, postnasal drip, rhinorrhea, sore throat, tinnitus, trouble swallowing and voice change.        History of allergic rhinitis and conjunctivitis  Eyes: Negative for pain, redness, itching and visual disturbance.       History of dry eyes  Respiratory: Negative for cough, choking, shortness of breath and wheezing.   Cardiovascular: Positive for leg swelling (History left DVT. Wears compression stockings bilaterally daily.). Negative for chest pain and palpitations.  Gastrointestinal: Positive for constipation. Negative for nausea, abdominal pain, diarrhea and abdominal distention.  Endocrine: Negative for cold intolerance, heat intolerance, polydipsia, polyphagia and polyuria.       History of hyperglycemia. Previously on metformin, but this was discontinued at some time over the last year because blood sugars began running normal.  Genitourinary: Negative for dysuria, urgency, frequency, hematuria, flank  pain, vaginal discharge, difficulty urinating and pelvic pain.  Musculoskeletal: Positive for back pain and gait problem (Unstable. Uses cane.). Negative for myalgias, arthralgias, neck pain and neck stiffness.  Skin: Positive for rash (lefft side of the neck and left nape of neck). Negative for color change and pallor.  Allergic/Immunologic: Negative.   Neurological: Negative for dizziness, tremors, seizures, syncope, weakness, numbness and headaches.       Meningioma partially resected in 2000. Continues to  residual meningioma at the base of the brain. Denies headaches or history of seizures. History of progressive memory loss. Had been using Aricept, but this was stopped by the patient, because she could not see that it was making any difference.  Hematological: Negative for adenopathy. Does not bruise/bleed easily.  Psychiatric/Behavioral: Positive for confusion and decreased concentration. Negative for suicidal ideas, hallucinations, behavioral problems, sleep disturbance, dysphoric mood and agitation. The patient is not nervous/anxious and is not hyperactive.     Filed Vitals:   12/05/14 1052  BP: 142/62  Pulse: 70  Temp: 97.2 F (36.2 C)  TempSrc: Oral  Resp: 20  Height: 5' 1"  (1.549 m)  Weight: 155 lb 9.6 oz (70.58 kg)  SpO2: 97%   Body mass index is 29.42 kg/(m^2).  Physical Exam  Constitutional: She is oriented to person, place, and time. She appears well-developed and well-nourished. No distress.  HENT:  Right Ear: External ear normal.  Left Ear: External ear normal.  Nose: Nose normal.  Mouth/Throat: Oropharynx is clear and moist. No oropharyngeal exudate.  Loss of hearing and use of hearing aids bilaterally.  Eyes: Conjunctivae and EOM are normal. Pupils are equal, round, and reactive to light. No scleral icterus.  Neck: No JVD present. No tracheal deviation present. No thyromegaly present.  Cardiovascular: Normal rate, regular rhythm, normal heart sounds and intact distal pulses.  Exam reveals no gallop and no friction rub.   No murmur heard. Pulmonary/Chest: Effort normal. No respiratory distress. She has no wheezes. She has no rales. She exhibits no tenderness.  Abdominal: She exhibits no distension and no mass. There is no tenderness.  Musculoskeletal: Normal range of motion. She exhibits no edema or tenderness.  Mild gait instability.  Lymphadenopathy:    She has no cervical adenopathy.  Neurological: She is alert and oriented to person, place, and time. No cranial  nerve deficit. Coordination normal.  Poor short-term recall. 10/03/2014 MMSE 25/30. Passed clock drawing.  Skin: No rash noted. She is not diaphoretic. No erythema. No pallor.  Cystic appearing lesions on the left neck and left nape of the neck.  Psychiatric: She has a normal mood and affect. Her behavior is normal. Judgment and thought content normal.     Labs reviewed: Lab Summary Latest Ref Rng 09/28/2014 02/11/2010 03/12/2009  Hemoglobin 12.0 - 15.0 g/dL (None) 12.9 13.5  Hematocrit 36.0 - 46.0 % (None) 38.0 38.6  White count 4.0 - 10.5 K/uL (None) (None) 6.8  Platelet count 150 - 400 K/uL (None) (None) 224  Sodium 137 - 147 mmol/L 140 140 136  Potassium 3.4 - 5.3 mmol/L 4.4 3.8 3.9  Calcium 8.4 - 10.5 mg/dL (None) (None) 9.2  Phosphorus - (None) (None) (None)  Creatinine 0.5 - 1.1 mg/dL 1.2(A) 1.1 0.94  AST 13 - 35 U/L 15 (None) 24  Alk Phos 25 - 125 U/L 57 (None) 49  Bilirubin 0.3 - 1.2 mg/dL (None) (None) 0.6  Glucose - 128 166(H) 114(H)  Cholesterol 0 - 200 mg/dL 137 (None) (None)  HDL cholesterol 35 -  70 mg/dL 39 (None) (None)  Triglycerides 40 - 160 mg/dL 163(A) (None) (None)  LDL Direct - (None) (None) (None)  LDL Calc - 65 (None) (None)  Total protein 6.0 - 8.3 g/dL (None) (None) 6.9  Albumin 3.5 - 5.2 g/dL (None) (None) 4.0   No results found for: TSH, T3TOTAL, T4TOTAL, THYROIDAB Lab Results  Component Value Date   BUN 30* 09/28/2014   No results found for: HGBA1C     Assessment/Plan  1. Skin lesion See dermatologist as planned  2. Memory loss Continue donepezil 10 mg daily. Consider starting Namenda at next appointment.  3. Insomnia Discontinue clonazepam. Patient's son were told this could have some bearing on memory problems.

## 2014-12-07 ENCOUNTER — Other Ambulatory Visit: Payer: Self-pay | Admitting: Internal Medicine

## 2014-12-08 ENCOUNTER — Telehealth: Payer: Self-pay | Admitting: *Deleted

## 2014-12-08 MED ORDER — ZOLPIDEM TARTRATE 10 MG PO TABS
10.0000 mg | ORAL_TABLET | Freq: Every evening | ORAL | Status: DC | PRN
Start: 2014-12-08 — End: 2015-01-04

## 2014-12-08 NOTE — Telephone Encounter (Signed)
Patient daughter in law called and stated that patient has "gone Crazy" stated that you took her off the Clonazepam and she needs something to help her sleep. Please Advise.

## 2014-12-08 NOTE — Telephone Encounter (Signed)
Daughter in law notified and phoned in Rx.

## 2014-12-08 NOTE — Telephone Encounter (Signed)
Zolpidem 10 mg (dispense 30) one nightly if needed for sleep

## 2014-12-12 ENCOUNTER — Telehealth: Payer: Self-pay | Admitting: *Deleted

## 2014-12-12 NOTE — Telephone Encounter (Signed)
Kayla Weaver, daughter called and stated that patient needed prior authorization on her Zolpidem. I called insurance [phone] and spoke with [facility]. Medication APPROVED till 12/19/2015. Member ID # [id number] Daughter and [facility] Notified.

## 2014-12-13 DIAGNOSIS — C4441 Basal cell carcinoma of skin of scalp and neck: Secondary | ICD-10-CM | POA: Diagnosis not present

## 2014-12-13 DIAGNOSIS — L820 Inflamed seborrheic keratosis: Secondary | ICD-10-CM | POA: Diagnosis not present

## 2014-12-26 DIAGNOSIS — C4441 Basal cell carcinoma of skin of scalp and neck: Secondary | ICD-10-CM | POA: Diagnosis not present

## 2015-01-04 ENCOUNTER — Other Ambulatory Visit: Payer: Self-pay | Admitting: Internal Medicine

## 2015-01-06 ENCOUNTER — Other Ambulatory Visit: Payer: Self-pay | Admitting: Internal Medicine

## 2015-01-09 ENCOUNTER — Non-Acute Institutional Stay: Payer: Medicare Other | Admitting: Internal Medicine

## 2015-01-09 ENCOUNTER — Encounter: Payer: Self-pay | Admitting: Internal Medicine

## 2015-01-09 VITALS — BP 140/62 | HR 64 | Temp 97.4°F | Resp 20 | Ht 61.0 in | Wt 146.2 lb

## 2015-01-09 DIAGNOSIS — R739 Hyperglycemia, unspecified: Secondary | ICD-10-CM

## 2015-01-09 DIAGNOSIS — R413 Other amnesia: Secondary | ICD-10-CM

## 2015-01-09 DIAGNOSIS — G4700 Insomnia, unspecified: Secondary | ICD-10-CM | POA: Diagnosis not present

## 2015-01-09 DIAGNOSIS — E785 Hyperlipidemia, unspecified: Secondary | ICD-10-CM | POA: Diagnosis not present

## 2015-01-09 DIAGNOSIS — C4491 Basal cell carcinoma of skin, unspecified: Secondary | ICD-10-CM | POA: Diagnosis not present

## 2015-01-09 DIAGNOSIS — I10 Essential (primary) hypertension: Secondary | ICD-10-CM | POA: Diagnosis not present

## 2015-01-09 DIAGNOSIS — L989 Disorder of the skin and subcutaneous tissue, unspecified: Secondary | ICD-10-CM

## 2015-01-09 MED ORDER — MEMANTINE HCL ER 7 & 14 & 21 &28 MG PO CP24: ORAL_CAPSULE | ORAL | Status: DC

## 2015-01-09 NOTE — Progress Notes (Signed)
Patient ID: Kayla Weaver, female   DOB: March 02, 1922, 82 y.o.   MRN: [id number]    [facility]     Place of Service: Clinic (12)     No Known Allergies  Chief Complaint  Patient presents with  . Medical Management of Chronic Issues    f/u -skin lesion    HPI:  Memory loss - unchanged. Willing to add Namenda to her current 10 mg donepezil.  Skin lesion - basal cell cancer of the left neck posteriorly removed by Dr. [name]. Nodular characteristics on pathology exam.  Essential hypertension - controlled  Hyperglycemia - needs follow-up lab next visit  Hyperlipidemia - needs follow-up lab next visit  Insomnia - clonazepam was discontinued at the time of her last office visit with me. She claimed that she was not using regularly and did not seem to miss it on the nights that she did not take it. However, soon after stopping the medication things did not go well. She became more confused and could not sleep at night. Her daughter called expressing alarmed about the situation. Zolpidem was prescribed instead of the clonazepam. Patient is tolerating this drug and sleeping well at the present time. She is taking the zolpidem nightly.    Medications: Patient's Medications  New Prescriptions   No medications on file  Previous Medications   ALREX 0.2 % SUSP    1 drop.    AMLODIPINE-ATORVASTATIN (CADUET) 5-10 MG TABLET    TAKE ONE TABLET DAILY FOR BLOOD PRESSURE   DOCUSATE SODIUM (COLACE) 100 MG CAPSULE    Take 100 mg by mouth. Take once daily in morning for constipation   DONEPEZIL (ARICEPT) 10 MG TABLET    TAKE ONE DAILY TO HELP PRESERVE MEMORY   FLUTICASONE (VERAMYST) 27.5 MCG/SPRAY NASAL SPRAY    Place 1 spray into the nose. Twice daily   LISINOPRIL-HYDROCHLOROTHIAZIDE (PRINZIDE,ZESTORETIC) 10-12.5 MG TABLET    TAKE 1 TABLET ONCE A DAY.   LORATADINE (ALLERGY RELIEF) 10 MG TABLET    Take 10 mg by mouth daily. Take one daily   MULTIPLE VITAMINS-MINERALS  (ICAPS) CAPS    Take by mouth. Take one tablet twice daily vitamin for eyes   ZOLPIDEM (AMBIEN) 10 MG TABLET    TAKE 1 TABLET BY MOUTH AT BEDTIME AS NEEDED FOR SLEEP  Modified Medications   No medications on file  Discontinued Medications   No medications on file     Review of Systems  Constitutional: Negative for fever, chills, diaphoresis, activity change, appetite change, fatigue and unexpected weight change.  HENT: Positive for hearing loss (Bilateral hearing aids). Negative for congestion, ear discharge, ear pain, postnasal drip, rhinorrhea, sore throat, tinnitus, trouble swallowing and voice change.        History of allergic rhinitis and conjunctivitis  Eyes: Negative for pain, redness, itching and visual disturbance.       History of dry eyes  Respiratory: Negative for cough, choking, shortness of breath and wheezing.   Cardiovascular: Positive for leg swelling (History left DVT. Wears compression stockings bilaterally daily.). Negative for chest pain and palpitations.  Gastrointestinal: Positive for constipation. Negative for nausea, abdominal pain, diarrhea and abdominal distention.  Endocrine: Negative for cold intolerance, heat intolerance, polydipsia, polyphagia and polyuria.       History of hyperglycemia. Previously on metformin, but this was discontinued at some time over the last year because blood sugars began running normal.  Genitourinary: Negative for dysuria, urgency, frequency, hematuria, flank pain, vaginal discharge, difficulty urinating  and pelvic pain.  Musculoskeletal: Positive for back pain and gait problem (Unstable. Uses cane.). Negative for myalgias, arthralgias, neck pain and neck stiffness.  Skin: Positive for rash (lefft side of the neck and left nape of neck). Negative for color change and pallor.       Dr. Elvera Lennox remove the lesion of the base of the left neck. Nodular basal cell cancer was found on the pathology report. Sutures are still in place but are  due to be removed today at his office.  Allergic/Immunologic: Negative.   Neurological: Negative for dizziness, tremors, seizures, syncope, weakness, numbness and headaches.       Meningioma partially resected in 2000. Continues to residual meningioma at the base of the brain. Denies headaches or history of seizures. History of progressive memory loss. Had been using Aricept, but this was stopped by the patient, because she could not see that it was making any difference. She has resumed it for the last month. She is tolerating the Aricept without side effect.  Hematological: Negative for adenopathy. Does not bruise/bleed easily.  Psychiatric/Behavioral: Positive for confusion, sleep disturbance (Responding well to use of zolpidem) and decreased concentration. Negative for suicidal ideas, hallucinations, behavioral problems, dysphoric mood and agitation. The patient is not nervous/anxious and is not hyperactive.     Filed Vitals:   01/09/15 0943  BP: 140/62  Pulse: 64  Temp: 97.4 F (36.3 C)  TempSrc: Oral  Resp: 20  Height: 5' 1"  (1.549 m)  Weight: 146 lb 3.2 oz (66.316 kg)  SpO2: 98%   Wt Readings from Last 3 Encounters:  01/09/15 146 lb 3.2 oz (66.316 kg)  12/05/14 155 lb 9.6 oz (70.58 kg)  11/07/14 157 lb (71.215 kg)    Body mass index is 27.64 kg/(m^2).  Physical Exam  Constitutional: She is oriented to person, place, and time. She appears well-developed and well-nourished. No distress.  HENT:  Right Ear: External ear normal.  Left Ear: External ear normal.  Nose: Nose normal.  Mouth/Throat: Oropharynx is clear and moist. No oropharyngeal exudate.  Loss of hearing and use of hearing aids bilaterally.  Eyes: Conjunctivae and EOM are normal. Pupils are equal, round, and reactive to light. No scleral icterus.  Neck: No JVD present. No tracheal deviation present. No thyromegaly present.  Cardiovascular: Normal rate, regular rhythm, normal heart sounds and intact distal pulses.   Exam reveals no gallop and no friction rub.   No murmur heard. Pulmonary/Chest: Effort normal. No respiratory distress. She has no wheezes. She has no rales. She exhibits no tenderness.  Abdominal: She exhibits no distension and no mass. There is no tenderness.  Musculoskeletal: Normal range of motion. She exhibits no edema or tenderness.  Mild gait instability.  Lymphadenopathy:    She has no cervical adenopathy.  Neurological: She is alert and oriented to person, place, and time. No cranial nerve deficit. Coordination normal.  Poor short-term recall. 10/03/2014 MMSE 25/30. Passed clock drawing.  Skin: No rash noted. She is not diaphoretic. No erythema. No pallor.  Cystic appearing lesions on the left neck and left nape of the neck. Has been removed by excisional biopsy by Dr. Elvera Lennox.. Sutures are still in place, but the wound appears fully healed.  Psychiatric: She has a normal mood and affect. Her behavior is normal. Judgment and thought content normal.     Labs reviewed: Lab Summary Latest Ref Rng 09/28/2014 02/11/2010 03/12/2009  Hemoglobin 12.0 - 15.0 g/dL (None) 12.9 13.5  Hematocrit 36.0 - 46.0 % (None)  38.0 38.6  White count 4.0 - 10.5 K/uL (None) (None) 6.8  Platelet count 150 - 400 K/uL (None) (None) 224  Sodium 137 - 147 mmol/L 140 140 136  Potassium 3.4 - 5.3 mmol/L 4.4 3.8 3.9  Calcium 8.4 - 10.5 mg/dL (None) (None) 9.2  Phosphorus - (None) (None) (None)  Creatinine 0.5 - 1.1 mg/dL 1.2(A) 1.1 0.94  AST 13 - 35 U/L 15 (None) 24  Alk Phos 25 - 125 U/L 57 (None) 49  Bilirubin 0.3 - 1.2 mg/dL (None) (None) 0.6  Glucose - 128 166(H) 114(H)  Cholesterol 0 - 200 mg/dL 137 (None) (None)  HDL cholesterol 35 - 70 mg/dL 39 (None) (None)  Triglycerides 40 - 160 mg/dL 163(A) (None) (None)  LDL Direct - (None) (None) (None)  LDL Calc - 65 (None) (None)  Total protein 6.0 - 8.3 g/dL (None) (None) 6.9  Albumin 3.5 - 5.2 g/dL (None) (None) 4.0   No results found for: TSH Lab  Results  Component Value Date   BUN 30* 09/28/2014   BUN 24* 02/11/2010   BUN 18 03/12/2009   Lab Results  Component Value Date   CREATININE 1.2* 09/28/2014   CREATININE 1.1 02/11/2010   CREATININE 0.94 03/12/2009   No results found for: HGBA1C     Assessment/Plan  1. Memory loss Continue donepezil 10 mg qd - Add Memantine HCl ER 7 & 14 & 21 &28 MG CP24; Use as directed on package to help preserve memory  Dispense: 28 capsule; Refill: 0 -1 titration phase memantine has completed, she should start Namzeric 28/10 one daily.  2. Skin lesion Basal cell cancer of the left neck  3. Essential hypertension Controlled  4. Hyperglycemia -CMP, future  5. Hyperlipidemia -Lipid panel, future  6. Insomnia Continue zolpidem  7. BCC (basal cell carcinoma of skin), nodular Continue follow-up with Dr. Elvera Lennox

## 2015-02-03 ENCOUNTER — Emergency Department (HOSPITAL_COMMUNITY)
Admission: EM | Admit: 2015-02-03 | Discharge: 2015-02-03 | Disposition: A | Discharge status: Home / Self Care | Payer: Medicare Other | Attending: Emergency Medicine | Admitting: Emergency Medicine

## 2015-02-03 ENCOUNTER — Emergency Department (HOSPITAL_COMMUNITY): Payer: Medicare Other

## 2015-02-03 ENCOUNTER — Other Ambulatory Visit: Payer: Self-pay | Admitting: Internal Medicine

## 2015-02-03 ENCOUNTER — Encounter (HOSPITAL_COMMUNITY): Payer: Self-pay | Admitting: *Deleted

## 2015-02-03 DIAGNOSIS — W1839XA Other fall on same level, initial encounter: Secondary | ICD-10-CM | POA: Diagnosis not present

## 2015-02-03 DIAGNOSIS — Z7951 Long term (current) use of inhaled steroids: Secondary | ICD-10-CM | POA: Diagnosis not present

## 2015-02-03 DIAGNOSIS — Y998 Other external cause status: Secondary | ICD-10-CM | POA: Insufficient documentation

## 2015-02-03 DIAGNOSIS — Z8719 Personal history of other diseases of the digestive system: Secondary | ICD-10-CM | POA: Insufficient documentation

## 2015-02-03 DIAGNOSIS — Z86718 Personal history of other venous thrombosis and embolism: Secondary | ICD-10-CM | POA: Diagnosis not present

## 2015-02-03 DIAGNOSIS — Y92009 Unspecified place in unspecified non-institutional (private) residence as the place of occurrence of the external cause: Secondary | ICD-10-CM | POA: Diagnosis not present

## 2015-02-03 DIAGNOSIS — Z8739 Personal history of other diseases of the musculoskeletal system and connective tissue: Secondary | ICD-10-CM | POA: Diagnosis not present

## 2015-02-03 DIAGNOSIS — H9190 Unspecified hearing loss, unspecified ear: Secondary | ICD-10-CM | POA: Insufficient documentation

## 2015-02-03 DIAGNOSIS — Y9389 Activity, other specified: Secondary | ICD-10-CM | POA: Diagnosis not present

## 2015-02-03 DIAGNOSIS — J45909 Unspecified asthma, uncomplicated: Secondary | ICD-10-CM | POA: Diagnosis not present

## 2015-02-03 DIAGNOSIS — F0390 Unspecified dementia without behavioral disturbance: Secondary | ICD-10-CM | POA: Diagnosis not present

## 2015-02-03 DIAGNOSIS — G8929 Other chronic pain: Secondary | ICD-10-CM | POA: Diagnosis not present

## 2015-02-03 DIAGNOSIS — Z79899 Other long term (current) drug therapy: Secondary | ICD-10-CM | POA: Insufficient documentation

## 2015-02-03 DIAGNOSIS — I10 Essential (primary) hypertension: Secondary | ICD-10-CM | POA: Insufficient documentation

## 2015-02-03 DIAGNOSIS — R030 Elevated blood-pressure reading, without diagnosis of hypertension: Secondary | ICD-10-CM | POA: Diagnosis not present

## 2015-02-03 DIAGNOSIS — G4700 Insomnia, unspecified: Secondary | ICD-10-CM | POA: Diagnosis not present

## 2015-02-03 DIAGNOSIS — R296 Repeated falls: Secondary | ICD-10-CM | POA: Diagnosis not present

## 2015-02-03 DIAGNOSIS — Z043 Encounter for examination and observation following other accident: Secondary | ICD-10-CM | POA: Diagnosis not present

## 2015-02-03 DIAGNOSIS — W19XXXA Unspecified fall, initial encounter: Secondary | ICD-10-CM

## 2015-02-03 DIAGNOSIS — Z8639 Personal history of other endocrine, nutritional and metabolic disease: Secondary | ICD-10-CM | POA: Insufficient documentation

## 2015-02-03 LAB — CBC WITH DIFFERENTIAL/PLATELET
BASOS ABS: 0 10*3/uL (ref 0.0–0.1)
BASOS PCT: 0 %
EOS ABS: 0.1 10*3/uL (ref 0.0–0.7)
Eosinophils Relative: 1 %
HEMATOCRIT: 37.1 % (ref 36.0–46.0)
Hemoglobin: 12.2 g/dL (ref 12.0–15.0)
Lymphocytes Relative: 30 %
Lymphs Abs: 2.6 10*3/uL (ref 0.7–4.0)
MCH: 31.3 pg (ref 26.0–34.0)
MCHC: 32.9 g/dL (ref 30.0–36.0)
MCV: 95.1 fL (ref 78.0–100.0)
MONO ABS: 0.6 10*3/uL (ref 0.1–1.0)
Monocytes Relative: 7 %
NEUTROS ABS: 5.5 10*3/uL (ref 1.7–7.7)
Neutrophils Relative %: 62 %
PLATELETS: 268 10*3/uL (ref 150–400)
RBC: 3.9 MIL/uL (ref 3.87–5.11)
RDW: 13 % (ref 11.5–15.5)
WBC: 8.8 10*3/uL (ref 4.0–10.5)

## 2015-02-03 LAB — URINE MICROSCOPIC-ADD ON: RBC / HPF: NONE SEEN RBC/hpf (ref 0–5)

## 2015-02-03 LAB — URINALYSIS, ROUTINE W REFLEX MICROSCOPIC
Bilirubin Urine: NEGATIVE
GLUCOSE, UA: NEGATIVE mg/dL
HGB URINE DIPSTICK: NEGATIVE
KETONES UR: NEGATIVE mg/dL
Nitrite: NEGATIVE
PROTEIN: NEGATIVE mg/dL
Specific Gravity, Urine: 1.018 (ref 1.005–1.030)
pH: 7 (ref 5.0–8.0)

## 2015-02-03 LAB — COMPREHENSIVE METABOLIC PANEL
ALK PHOS: 62 U/L (ref 38–126)
ALT: 16 U/L (ref 14–54)
ANION GAP: 11 (ref 5–15)
AST: 23 U/L (ref 15–41)
Albumin: 3.9 g/dL (ref 3.5–5.0)
BUN: 22 mg/dL — ABNORMAL HIGH (ref 6–20)
CALCIUM: 9.4 mg/dL (ref 8.9–10.3)
CO2: 26 mmol/L (ref 22–32)
CREATININE: 1.03 mg/dL — AB (ref 0.44–1.00)
Chloride: 100 mmol/L — ABNORMAL LOW (ref 101–111)
GFR, EST AFRICAN AMERICAN: 54 mL/min — AB (ref >60)
GFR, EST NON AFRICAN AMERICAN: 46 mL/min — AB (ref >60)
Glucose, Bld: 133 mg/dL — ABNORMAL HIGH (ref 65–99)
Potassium: 4.3 mmol/L (ref 3.5–5.1)
SODIUM: 137 mmol/L (ref 135–145)
Total Bilirubin: 0.4 mg/dL (ref 0.3–1.2)
Total Protein: 7 g/dL (ref 6.5–8.1)

## 2015-02-03 LAB — I-STAT TROPONIN, ED: TROPONIN I, POC: 0 ng/mL (ref 0.00–0.08)

## 2015-02-03 NOTE — ED Notes (Signed)
Bed: WA03 Expected date:  Expected time:  Means of arrival:  Comments: EMS 

## 2015-02-03 NOTE — ED Notes (Signed)
Pt had IV placed by EMS, RN will try and draw labs from there. Will notify if unsuccessful

## 2015-02-03 NOTE — ED Notes (Addendum)
Per EMS - patient comes from [facility] independent living after being found on floor x3 today.  These episodes were not witnessed and patient has no recollection of how she came to be on floor.  Unknown if patient was syncopal.  Patient denies complaints of pain and no obvious injuries or deformities, no head trauma, no blood thinners.  Neuro exam unremarkable, PERRL, MAE, ambulatory on scene.  Vitals on scene 186/82, HR 74, 100% on RA, CBG 126.  Patient is at baseline mental status according to independent living neighbor with increased confusion.  Last scene normal sometime yesterday.  Patient oriented to person, place, time and events.

## 2015-02-03 NOTE — ED Provider Notes (Signed)
CSN: [id number]     Arrival date & time 02/03/15  1121 History   First MD Initiated Contact with Patient 02/03/15 1138     Chief Complaint  Patient presents with  . Fall     (Consider location/radiation/quality/duration/timing/severity/associated sxs/prior Treatment) Patient is a 82 y.o. female presenting with fall and general illness. The history is provided by the nursing home.  Fall  Illness Severity:  Mild Onset quality:  Sudden Duration:  1 day Timing:  Constant Progression:  Unchanged Chronicity:  New  Level V caveat dementia.  Per the nursing home patient was found on the floor 3 separate times a day. Unsure of the exact etiology fall. Patient does not remember these events currently has no complaints. Has a baseline per the facility.  Past Medical History  Diagnosis Date  . Meningioma ([place]) 2000    Dr. Ellene Route  . Carpal tunnel syndrome 1980  . Hypertension   . GERD (gastroesophageal reflux disease)   . Allergic rhinitis   . Osteopenia   . Asthma   . Chronic low back pain   . Hearing loss     Hearing aids  . Left leg DVT ([place])     chronic since 2008  . Memory loss 06/27/2014  . Hyperglycemia 06/27/2014  . Insomnia 06/27/2014  . Hyperlipidemia 06/27/2014  . Constipation 06/27/2014   Past Surgical History  Procedure Laterality Date  . Brain tumor excision  2000    Dr. Ellene Route  . Carpal tunnel release  1980  . Back surgery  1999    ruptured disk  . Tear duct probing Bilateral 2014    [place]   Family History  Problem Relation Age of Onset  . CVA Mother   . Cancer Father     prostate    Social History  Substance Use Topics  . Smoking status: Never Smoker   . Smokeless tobacco: Never Used  . Alcohol Use: No   OB History    No data available     Review of Systems    Allergies  Review of patient's allergies indicates no known allergies.  Home Medications   Prior to Admission medications   Medication Sig Start Date End Date Taking?  Authorizing Provider  ALREX 0.2 % SUSP Place 1 drop into both eyes 2 (two) times daily.  10/02/14  Yes Historical Provider, MD  amLODipine-atorvastatin (CADUET) 5-10 MG tablet TAKE ONE TABLET DAILY FOR BLOOD PRESSURE 01/09/15  Yes Estill Dooms, MD  docusate sodium (COLACE) 100 MG capsule Take 100 mg by mouth daily.    Yes Historical Provider, MD  donepezil (ARICEPT) 10 MG tablet TAKE ONE DAILY TO HELP PRESERVE MEMORY 11/08/14  Yes Historical Provider, MD  fluticasone (VERAMYST) 27.5 MCG/SPRAY nasal spray Place 1 spray into the nose 2 (two) times daily. Twice daily   Yes Historical Provider, MD  lisinopril-hydrochlorothiazide (PRINZIDE,ZESTORETIC) 10-12.5 MG tablet TAKE 1 TABLET ONCE A DAY. 12/08/14  Yes Estill Dooms, MD  loratadine (ALLERGY RELIEF) 10 MG tablet Take 10 mg by mouth daily.    Yes Historical Provider, MD  Memantine HCl ER 7 & 14 & 21 &28 MG CP24 Use as directed on package to help preserve memory 01/09/15  Yes Estill Dooms, MD  Multiple Vitamins-Minerals (ICAPS) CAPS Take by mouth. Take one tablet twice daily vitamin for eyes   Yes Historical Provider, MD  zolpidem (AMBIEN) 10 MG tablet TAKE 1 TABLET BY MOUTH AT BEDTIME AS NEEDED FOR SLEEP 01/05/15  Yes Estill Dooms,  MD   BP 145/53 mmHg  Pulse 86  Temp(Src) 97.5 F (36.4 C) (Oral)  Resp 17  SpO2 97% Physical Exam  Constitutional: She is oriented to person, place, and time. She appears well-developed and well-nourished. No distress.  HENT:  Head: Normocephalic and atraumatic.  Eyes: EOM are normal. Pupils are equal, round, and reactive to light.  Neck: Normal range of motion. Neck supple.  Cardiovascular: Normal rate and regular rhythm.  Exam reveals no gallop and no friction rub.   No murmur heard. Pulmonary/Chest: Effort normal. She has no wheezes. She has no rales.  Abdominal: Soft. She exhibits no distension. There is no tenderness.  Musculoskeletal: She exhibits no edema or tenderness.  Neurological: She is alert and  oriented to person, place, and time. No cranial nerve deficit or sensory deficit. She displays a negative Romberg sign. GCS eye subscore is 4. GCS verbal subscore is 5. GCS motor subscore is 6. She displays no Babinski's sign on the right side. She displays no Babinski's sign on the left side.  Patient is able to ambulate in the hallway without difficulty.  Skin: Skin is warm and dry. She is not diaphoretic.  Psychiatric: She has a normal mood and affect. Her behavior is normal.  Nursing note and vitals reviewed.   ED Course  Procedures (including critical care time) Labs Review Labs Reviewed  URINALYSIS, ROUTINE W REFLEX MICROSCOPIC (NOT AT [facility]) - Abnormal; Notable for the following:    Leukocytes, UA SMALL (*)    All other components within normal limits  COMPREHENSIVE METABOLIC PANEL - Abnormal; Notable for the following:    Chloride 100 (*)    Glucose, Bld 133 (*)    BUN 22 (*)    Creatinine, Ser 1.03 (*)    GFR calc non Af Amer 46 (*)    GFR calc Af Amer 54 (*)    All other components within normal limits  URINE MICROSCOPIC-ADD ON - Abnormal; Notable for the following:    Squamous Epithelial / LPF 0-5 (*)    Bacteria, UA FEW (*)    All other components within normal limits  CBC WITH DIFFERENTIAL/PLATELET  Randolm Idol, ED    Imaging Review Dg Chest 2 View  02/03/2015  CLINICAL DATA:  Multiple recent falls EXAM: CHEST  2 VIEW COMPARISON:  None. FINDINGS: The heart size and mediastinal contours are within normal limits. Diffuse aortic calcifications are noted. Both lungs are clear. The visualized skeletal structures are unremarkable. IMPRESSION: No active cardiopulmonary disease. Electronically Signed   By: Inez Catalina M.D.   On: 02/03/2015 12:37   Ct Head Wo Contrast  02/03/2015  CLINICAL DATA:  Fall 3 times today.  Falls unwitnessed. EXAM: CT HEAD WITHOUT CONTRAST TECHNIQUE: Contiguous axial images were obtained from the base of the skull through the vertex without  intravenous contrast. COMPARISON:  Brain MRI 02/02/2008 FINDINGS: Stable meningioma at the base of the brain near the left sphenoid bone, measuring up to 2.4 cm compared to 2.5 cm previously. There is atrophy and chronic small vessel disease changes. No hemorrhage, hydrocephalus or acute infarction. No midline shift. Postoperative changes from craniectomy in the right occipital region. No acute calvarial abnormality. Visualized paranasal sinuses and mastoids clear. Orbital soft tissues unremarkable. IMPRESSION: Left skullbase meningioma, stable. No acute intracranial abnormality. Atrophy, chronic microvascular disease. Electronically Signed   By: Rolm Baptise M.D.   On: 02/03/2015 12:12   I have personally reviewed and evaluated these images and lab results as part of my  medical decision-making.   EKG Interpretation None      MDM   Final diagnoses:  Fall, initial encounter    82 yo M with a chief complaints of multiple possible fall today. Patient with no signs of trauma and currently has no complaints. Patient had a CT of the head that was negative for acute changes. Laboratory evaluation EKG troponin negative. Patient with no UTI. Will discharge the patient home and she still well-appearing was able to ambulate without difficulty. No focal neuro findings. PCP follow-up.  4:01 PM:  I have discussed the diagnosis/risks/treatment options with the patient and believe the pt to be eligible for discharge home to follow-up with PCP. We also discussed returning to the ED immediately if new or worsening sx occur. We discussed the sx which are most concerning (e.g., recurrent falls, cp, sob) that necessitate immediate return. Medications administered to the patient during their visit and any new prescriptions provided to the patient are listed below.  Medications given during this visit Medications - No data to display  Discharge Medication List as of 02/03/2015  2:12 PM      The patient appears  reasonably screen and/or stabilized for discharge and I doubt any other medical condition or other [facility] requiring further screening, evaluation, or treatment in the ED at this time prior to discharge.      Deno Etienne, DO 02/03/15 [phone]

## 2015-02-05 ENCOUNTER — Other Ambulatory Visit: Payer: Self-pay | Admitting: *Deleted

## 2015-02-05 MED ORDER — MEMANTINE HCL-DONEPEZIL HCL ER 28-10 MG PO CP24: 1.0000 | ORAL_CAPSULE | Freq: Every day | ORAL | Status: DC

## 2015-02-05 NOTE — Telephone Encounter (Signed)
Patient daughter requested Rx to be faxed to pharmacy, will finish titration pak this week.

## 2015-03-01 ENCOUNTER — Telehealth: Payer: Self-pay

## 2015-03-01 NOTE — Telephone Encounter (Signed)
Patient's daughter called requesting to speak with Dr.Green, they are considering relocating patient to assisted living yet Mardene Celeste has a few questions before finalizing this decision. Please call

## 2015-03-01 NOTE — Telephone Encounter (Signed)
I returned a telephone call to Mardene Celeste at [phone]. I explained that I think Mr. Wischmeyer has Alzheimer's disease. We started Namenda at the time of her last visit. Apparently, she has been found wandering in the hallways on more than one occasion. I stated that I would approve admission to the assisted living area and would recommend this.

## 2015-03-02 ENCOUNTER — Other Ambulatory Visit: Payer: Self-pay | Admitting: Internal Medicine

## 2015-03-13 ENCOUNTER — Non-Acute Institutional Stay: Payer: Medicare Other | Admitting: Internal Medicine

## 2015-03-13 ENCOUNTER — Encounter: Payer: Self-pay | Admitting: Internal Medicine

## 2015-03-13 VITALS — BP 140/80 | HR 72 | Temp 97.4°F | Resp 20 | Ht 61.0 in | Wt 143.0 lb

## 2015-03-13 DIAGNOSIS — F0280 Dementia in other diseases classified elsewhere without behavioral disturbance: Secondary | ICD-10-CM

## 2015-03-13 DIAGNOSIS — R739 Hyperglycemia, unspecified: Secondary | ICD-10-CM

## 2015-03-13 DIAGNOSIS — G309 Alzheimer's disease, unspecified: Secondary | ICD-10-CM

## 2015-03-13 DIAGNOSIS — I10 Essential (primary) hypertension: Secondary | ICD-10-CM

## 2015-03-13 NOTE — Progress Notes (Signed)
Patient ID: Kayla Weaver, female   DOB: 1922/08/26, 82 y.o.   MRN: [id number]    [facility]     Place of Service: Clinic (12)     No Known Allergies  Chief Complaint  Patient presents with  . Medical Management of Chronic Issues    f/u    HPI:  Patient seen today in follow-up of her ability to tolerate Namenda. She is on both Namenda and Aricept for her memory loss. Memory loss has been progressive and she is being moved to the assisted living area for safety. There have been at least 2 episodes of wandering and lost in the hallways. She has not wandered outdoors.  Patient is upset when I advised her that she should move to assisted living. Her son and daughter-in-law from [place] are here today. They are in agreement with the move.  Patient is otherwise stable. She has no complaints. She denies nausea, increased dizziness, change in sleep habits, or arthralgias.  Medications: Patient's Medications  New Prescriptions   No medications on file  Previous Medications   ALREX 0.2 % SUSP    Place 1 drop into both eyes 2 (two) times daily.    AMLODIPINE-ATORVASTATIN (CADUET) 5-10 MG TABLET    TAKE ONE TABLET DAILY FOR BLOOD PRESSURE   CLONAZEPAM (KLONOPIN) 0.5 MG TABLET    Take 0.5 mg by mouth at bedtime as needed. for sleep   DOCUSATE SODIUM (COLACE) 100 MG CAPSULE    Take 100 mg by mouth daily.    DONEPEZIL (ARICEPT) 10 MG TABLET    TAKE ONE DAILY TO HELP PRESERVE MEMORY   FLUTICASONE (VERAMYST) 27.5 MCG/SPRAY NASAL SPRAY    Place 1 spray into the nose 2 (two) times daily. Twice daily   LISINOPRIL-HYDROCHLOROTHIAZIDE (PRINZIDE,ZESTORETIC) 10-12.5 MG TABLET    TAKE 1 TABLET ONCE A DAY.   LORATADINE (ALLERGY RELIEF) 10 MG TABLET    Take 10 mg by mouth daily.    MEMANTINE HCL-DONEPEZIL HCL (NAMZARIC) 28-10 MG CP24    Take 1 tablet by mouth daily. To preserve memory   MULTIPLE VITAMINS-MINERALS (ICAPS) CAPS    Take by mouth. Take one tablet twice daily vitamin for  eyes   NAMENDA XR TITRATION PACK 7 & 14 & 21 &28 MG CP24    USE AS DIRECTED ON PACKAGE TO HELP PRESERVE MEMORY   ZOLPIDEM (AMBIEN) 10 MG TABLET    TAKE 1 TABLET BY MOUTH AT BEDTIME AS NEEDED  Modified Medications   No medications on file  Discontinued Medications   MEMANTINE HCL ER 7 & 14 & 21 &28 MG CP24    Use as directed on package to help preserve memory     Review of Systems  Constitutional: Negative for fever, chills, diaphoresis, activity change, appetite change, fatigue and unexpected weight change.  HENT: Positive for hearing loss (Bilateral hearing aids). Negative for congestion, ear discharge, ear pain, postnasal drip, rhinorrhea, sore throat, tinnitus, trouble swallowing and voice change.        History of allergic rhinitis and conjunctivitis  Eyes: Negative for pain, redness, itching and visual disturbance.       History of dry eyes  Respiratory: Negative for cough, choking, shortness of breath and wheezing.   Cardiovascular: Positive for leg swelling (History left DVT. Wears compression stockings bilaterally daily.). Negative for chest pain and palpitations.  Gastrointestinal: Positive for constipation. Negative for nausea, abdominal pain, diarrhea and abdominal distention.  Endocrine: Negative for cold intolerance, heat intolerance, polydipsia, polyphagia and  polyuria.       History of hyperglycemia. Previously on metformin, but this was discontinued at some time over the last year because blood sugars began running normal.  Genitourinary: Negative for dysuria, urgency, frequency, hematuria, flank pain, vaginal discharge, difficulty urinating and pelvic pain.  Musculoskeletal: Positive for back pain and gait problem (Unstable. Uses cane.). Negative for myalgias, arthralgias, neck pain and neck stiffness.  Skin: Positive for rash (lefft side of the neck and left nape of neck). Negative for color change and pallor.       Dr. Elvera Lennox remove the lesion of the base of the left  neck. Nodular basal cell cancer was found on the pathology report. Sutures are still in place but are due to be removed today at his office.  Allergic/Immunologic: Negative.   Neurological: Negative for dizziness, tremors, seizures, syncope, weakness, numbness and headaches.       Meningioma partially resected in 2000. Continues to residual meningioma at the base of the brain. Denies headaches or history of seizures. History of progressive memory loss. Had been using Aricept, but this was stopped by the patient, because she could not see that it was making any difference. She has resumed it. She is tolerating the Aricept and Namenda without side effect.  Hematological: Negative for adenopathy. Does not bruise/bleed easily.  Psychiatric/Behavioral: Positive for confusion, sleep disturbance (Responding well to use of zolpidem) and decreased concentration. Negative for suicidal ideas, hallucinations, behavioral problems, dysphoric mood and agitation. The patient is not nervous/anxious and is not hyperactive.     Filed Vitals:   03/13/15 0923  BP: 140/80  Pulse: 72  Temp: 97.4 F (36.3 C)  TempSrc: Oral  Resp: 20  Height: 5' 1"  (1.549 m)  Weight: 143 lb (64.864 kg)  SpO2: 96%   Wt Readings from Last 3 Encounters:  03/13/15 143 lb (64.864 kg)  01/09/15 146 lb 3.2 oz (66.316 kg)  12/05/14 155 lb 9.6 oz (70.58 kg)    Body mass index is 27.03 kg/(m^2).  Physical Exam  Constitutional: She is oriented to person, place, and time. She appears well-developed and well-nourished. No distress.  HENT:  Right Ear: External ear normal.  Left Ear: External ear normal.  Nose: Nose normal.  Mouth/Throat: Oropharynx is clear and moist. No oropharyngeal exudate.  Loss of hearing and use of hearing aids bilaterally.  Eyes: Conjunctivae and EOM are normal. Pupils are equal, round, and reactive to light. No scleral icterus.  Neck: No JVD present. No tracheal deviation present. No thyromegaly present.    Cardiovascular: Normal rate, regular rhythm, normal heart sounds and intact distal pulses.  Exam reveals no gallop and no friction rub.   No murmur heard. Pulmonary/Chest: Effort normal. No respiratory distress. She has no wheezes. She has no rales. She exhibits no tenderness.  Abdominal: She exhibits no distension and no mass. There is no tenderness.  Musculoskeletal: Normal range of motion. She exhibits no edema or tenderness.  Mild gait instability.  Lymphadenopathy:    She has no cervical adenopathy.  Neurological: She is alert and oriented to person, place, and time. No cranial nerve deficit. Coordination normal.  Poor short-term recall. 10/03/2014 MMSE 25/30. Passed clock drawing.  Skin: No rash noted. She is not diaphoretic. No erythema. No pallor.  Cystic appearing lesions on the left neck and left nape of the neck. Has been removed by excisional biopsy by Dr. Elvera Lennox.. Sutures are still in place, but the wound appears fully healed.  Psychiatric: She has a normal mood  and affect. Her behavior is normal. Judgment and thought content normal.     Labs reviewed: Lab Summary Latest Ref Rng 02/03/2015 09/28/2014 02/11/2010  Hemoglobin 12.0 - 15.0 g/dL 12.2 (None) 12.9  Hematocrit 36.0 - 46.0 % 37.1 (None) 38.0  White count 4.0 - 10.5 K/uL 8.8 (None) (None)  Platelet count 150 - 400 K/uL 268 (None) (None)  Sodium 135 - 145 mmol/L 137 140 140  Potassium 3.5 - 5.1 mmol/L 4.3 4.4 3.8  Calcium 8.9 - 10.3 mg/dL 9.4 (None) (None)  Phosphorus - (None) (None) (None)  Creatinine 0.44 - 1.00 mg/dL 1.03(H) 1.2(A) 1.1  AST 15 - 41 U/L 23 15 (None)  Alk Phos 38 - 126 U/L 62 57 (None)  Bilirubin 0.3 - 1.2 mg/dL 0.4 (None) (None)  Glucose 65 - 99 mg/dL 133(H) 128 166(H)  Cholesterol 0 - 200 mg/dL (None) 137 (None)  HDL cholesterol 35 - 70 mg/dL (None) 39 (None)  Triglycerides 40 - 160 mg/dL (None) 163(A) (None)  LDL Direct - (None) (None) (None)  LDL Calc - (None) 65 (None)  Total protein 6.5  - 8.1 g/dL 7.0 (None) (None)  Albumin 3.5 - 5.0 g/dL 3.9 (None) (None)   No results found for: TSH Lab Results  Component Value Date   BUN 22* 02/03/2015   BUN 30* 09/28/2014   BUN 24* 02/11/2010   Lab Results  Component Value Date   CREATININE 1.03* 02/03/2015   CREATININE 1.2* 09/28/2014   CREATININE 1.1 02/11/2010   No results found for: HGBA1C     Assessment/Plan  1. Alzheimer disease Progressive memory loss. Patient is being moved to assisted living. She has been wandering in the hallway on at least 2 occasions and lost at that time.  2. Essential hypertension Controlled  3. Hyperglycemia Follow-up the future

## 2015-03-20 ENCOUNTER — Other Ambulatory Visit: Payer: Self-pay | Admitting: Internal Medicine

## 2015-03-23 ENCOUNTER — Encounter: Payer: Self-pay | Admitting: Internal Medicine

## 2015-03-23 NOTE — Progress Notes (Signed)
This encounter was created in error - please disregard.

## 2015-03-26 ENCOUNTER — Other Ambulatory Visit: Payer: Self-pay | Admitting: *Deleted

## 2015-03-26 MED ORDER — ZOLPIDEM TARTRATE 10 MG PO TABS: ORAL_TABLET | ORAL | Status: DC

## 2015-03-26 NOTE — Telephone Encounter (Signed)
[facility] Fax Request

## 2015-05-29 ENCOUNTER — Non-Acute Institutional Stay: Payer: Medicare Other | Admitting: Nurse Practitioner

## 2015-05-29 ENCOUNTER — Encounter: Payer: Self-pay | Admitting: Nurse Practitioner

## 2015-05-29 DIAGNOSIS — I10 Essential (primary) hypertension: Secondary | ICD-10-CM | POA: Diagnosis not present

## 2015-05-29 DIAGNOSIS — K5900 Constipation, unspecified: Secondary | ICD-10-CM | POA: Diagnosis not present

## 2015-05-29 DIAGNOSIS — F0280 Dementia in other diseases classified elsewhere without behavioral disturbance: Secondary | ICD-10-CM

## 2015-05-29 DIAGNOSIS — G309 Alzheimer's disease, unspecified: Secondary | ICD-10-CM

## 2015-05-29 NOTE — Assessment & Plan Note (Signed)
Blood pressure is controlled, continue Amlodipine 5mg , Lisinopril/HCT 10/12.5mg , update CBC, CMP, TSH

## 2015-05-29 NOTE — Assessment & Plan Note (Signed)
Stable, continue diet control, Colace daily. 

## 2015-05-29 NOTE — Progress Notes (Signed)
Patient ID: Kayla Weaver, female   DOB: [birth year], 82 y.o.   MRN: [id number]  Location:  [place] Room Number: ALF 17 Place of Service: [place] Provider:  Lennie Odor Geniece Akers NP  Estill Dooms, MD  Patient Care Team: Estill Dooms, MD as PCP - General (Internal Medicine) Roger Shelter, MD (Anesthesiology) Estill Dooms, MD as Consulting Physician (Internal Medicine) Demarquez Ciolek X, NP as Nurse Practitioner (Nurse Practitioner) Kristeen Miss, MD as Consulting Physician (Neurosurgery) Shon Hough, MD as Consulting Physician (Ophthalmology) [facility] (Skilled Nursing and [place]) [name], MD as Consulting Physician (Dermatology)  Extended Emergency Contact Information Primary Emergency Contact: [name] Address: [address]          [name] Phone: [phone] Mobile Phone: [phone] Relation: Son  Code Status:  DNR Goals of care: Advanced Directive information Advanced Directives 05/29/2015  Does patient have an advance directive? Yes  Type of Advance Directive Living will;Healthcare Power of Attorney  Does patient want to make changes to advanced directive? No - Patient declined  Copy of advanced directive(s) in chart? Yes     Chief Complaint  Patient presents with  . Medical Management of Chronic Issues    HPI:  Pt is a 82 y.o. female seen today for medical management of chronic diseases.  Hx of HTN, controlled, taking Lisinopril/HCT 10/12.5, Amlodipine 5mg . Memory is preserved on Namenda and Aricpet.    Past Medical History  Diagnosis Date  . Meningioma ([place]) 2000    Dr. Ellene Route  . Carpal tunnel syndrome 1980  . Hypertension   . GERD (gastroesophageal reflux disease)   . Allergic rhinitis   . Osteopenia   . Asthma   . Chronic low back pain   . Hearing loss     Hearing aids  . Left leg DVT ([place])     chronic since 2008  . Memory loss 06/27/2014  .  Hyperglycemia 06/27/2014  . Insomnia 06/27/2014  . Hyperlipidemia 06/27/2014  . Constipation 06/27/2014   Past Surgical History  Procedure Laterality Date  . Brain tumor excision  2000    Dr. Ellene Route  . Carpal tunnel release  1980  . Back surgery  1999    ruptured disk  . Tear duct probing Bilateral 2014    [place]    No Known Allergies    Medication List       This list is accurate as of: 05/29/15  5:19 PM.  Always use your most recent med list.               ALLERGY RELIEF 10 MG tablet  Generic drug:  loratadine  Take 10 mg by mouth daily.     ALREX 0.2 % Susp  Generic drug:  loteprednol  Place 1 drop into both eyes 2 (two) times daily.     amLODipine-atorvastatin 5-10 MG tablet  Commonly known as:  CADUET  TAKE ONE TABLET DAILY FOR BLOOD PRESSURE     docusate sodium 100 MG capsule  Commonly known as:  COLACE  Take 100 mg by mouth daily.     fluticasone 27.5 MCG/SPRAY nasal spray  Commonly known as:  VERAMYST  Place 1 spray into the nose 2 (two) times daily. Twice daily     ICAPS Caps  Take by mouth. Take one tablet twice daily vitamin for eyes     lisinopril-hydrochlorothiazide 10-12.5 MG tablet  Commonly known as:  PRINZIDE,ZESTORETIC  TAKE 1 TABLET ONCE A DAY.     Memantine HCl-Donepezil HCl 28-10 MG Cp24  Commonly known as:  NAMZARIC  Take 1 tablet by mouth daily. To preserve memory        Review of Systems  Constitutional: Negative for fever, chills, diaphoresis, activity change, appetite change, fatigue and unexpected weight change.  HENT: Positive for hearing loss (Bilateral hearing aids). Negative for congestion, ear discharge, ear pain, postnasal drip, rhinorrhea, sore throat, tinnitus, trouble swallowing and voice change.        History of allergic rhinitis and conjunctivitis  Eyes: Negative for pain, redness, itching and visual disturbance.       History of dry eyes  Respiratory: Negative for cough, choking, shortness of breath and wheezing.    Cardiovascular: Positive for leg swelling (History left DVT. Wears compression stockings bilaterally daily.). Negative for chest pain and palpitations.  Gastrointestinal: Positive for constipation. Negative for nausea, abdominal pain, diarrhea and abdominal distention.  Endocrine: Negative for cold intolerance, heat intolerance, polydipsia, polyphagia and polyuria.       History of hyperglycemia. Previously on metformin, but this was discontinued at some time over the last year because blood sugars began running normal.  Genitourinary: Negative for dysuria, urgency, frequency, hematuria, flank pain, vaginal discharge, difficulty urinating and pelvic pain.  Musculoskeletal: Positive for back pain and gait problem (Unstable. Uses cane.). Negative for myalgias, arthralgias, neck pain and neck stiffness.  Skin: Positive for rash (lefft side of the neck and left nape of neck). Negative for color change and pallor.       Dr. Elvera Lennox remove the lesion of the base of the left neck. Nodular basal cell cancer was found on the pathology report. Sutures are still in place but are due to be removed today at his office.  Allergic/Immunologic: Negative.   Neurological: Negative for dizziness, tremors, seizures, syncope, weakness, numbness and headaches.       Meningioma partially resected in 2000. Continues to residual meningioma at the base of the brain. Denies headaches or history of seizures. History of progressive memory loss. Had been using Aricept, but this was stopped by the patient, because she could not see that it was making any difference. She has resumed it. She is tolerating the Aricept and Namenda without side effect.  Hematological: Negative for adenopathy. Does not bruise/bleed easily.  Psychiatric/Behavioral: Positive for confusion, sleep disturbance (Responding well to use of zolpidem) and decreased concentration. Negative for suicidal ideas, hallucinations, behavioral problems, dysphoric mood  and agitation. The patient is not nervous/anxious and is not hyperactive.     Immunization History  Administered Date(s) Administered  . Influenza-Unspecified 10/20/2013, 10/05/2014  . PPD Test 10/10/2013  . Pneumococcal Polysaccharide-23 02/06/2009  . Td 10/11/2010  . Zoster 01/08/2012   Pertinent  Health Maintenance Due  Topic Date Due  . DEXA SCAN  10/06/1989  . PNA vac Low Risk Adult (2 of 2 - PCV13) 02/06/2010  . INFLUENZA VACCINE  08/07/2015   Fall Risk  03/13/2015 01/09/2015 12/05/2014 11/07/2014 10/10/2014  Falls in the past year? Yes No No No No  Number falls in past yr: 1 - - - -  Injury with Fall? No - - - -   Functional Status Survey:    Filed Vitals:   05/29/15 1115  BP: 118/74  Pulse: 68  Temp: 98.8 F (37.1 C)  TempSrc: Oral  Resp: 18  Height: 5\' 3"  (1.6 m)  Weight: 143 lb (64.864 kg)  SpO2: 96%   Body mass index  is 25.34 kg/(m^2). Physical Exam  Constitutional: She is oriented to person, place, and time. She appears well-developed and well-nourished. No distress.  HENT:  Right Ear: External ear normal.  Left Ear: External ear normal.  Nose: Nose normal.  Mouth/Throat: Oropharynx is clear and moist. No oropharyngeal exudate.  Loss of hearing and use of hearing aids bilaterally.  Eyes: Conjunctivae and EOM are normal. Pupils are equal, round, and reactive to light. No scleral icterus.  Neck: No JVD present. No tracheal deviation present. No thyromegaly present.  Cardiovascular: Normal rate, regular rhythm, normal heart sounds and intact distal pulses.  Exam reveals no gallop and no friction rub.   No murmur heard. Pulmonary/Chest: Effort normal. No respiratory distress. She has no wheezes. She has no rales. She exhibits no tenderness.  Abdominal: She exhibits no distension and no mass. There is no tenderness.  Musculoskeletal: Normal range of motion. She exhibits edema. She exhibits no tenderness.  Mild gait instability. Trace edema BLE, compression  hosiery   Lymphadenopathy:    She has no cervical adenopathy.  Neurological: She is alert and oriented to person, place, and time. No cranial nerve deficit. Coordination normal.  Poor short-term recall. 10/03/2014 MMSE 25/30. Passed clock drawing.  Skin: No rash noted. She is not diaphoretic. No erythema. No pallor.  Cystic appearing lesions on the left neck and left nape of the neck. Has been removed by excisional biopsy by Dr. Elvera Lennox.. Sutures are still in place, but the wound appears fully healed.  Psychiatric: She has a normal mood and affect. Her behavior is normal. Judgment and thought content normal.    Labs reviewed:  Recent Labs  09/28/14 02/03/15 1152  NA 140 137  K 4.4 4.3  CL  --  100*  CO2  --  26  GLUCOSE  --  133*  BUN 30* 22*  CREATININE 1.2* 1.03*  CALCIUM  --  9.4    Recent Labs  09/28/14 02/03/15 1152  AST 15 23  ALT 11 16  ALKPHOS 57 62  BILITOT  --  0.4  PROT  --  7.0  ALBUMIN  --  3.9    Recent Labs  02/03/15 1152  WBC 8.8  NEUTROABS 5.5  HGB 12.2  HCT 37.1  MCV 95.1  PLT 268   No results found for: TSH No results found for: HGBA1C Lab Results  Component Value Date   CHOL 137 09/28/2014   HDL 39 09/28/2014   LDLCALC 65 09/28/2014   TRIG 163* 09/28/2014    Significant Diagnostic Results in last 30 days:  No results found.  Assessment/Plan  Hypertension Blood pressure is controlled, continue Amlodipine 5mg , Lisinopril/HCT 10/12.5mg , update CBC, CMP, TSH  Constipation Stable, continue diet control, Colace daily.   Alzheimer disease Resides in AL for care needs, taking Namenda and Aricept for memory preservation.     Family/ staff Communication: AL for care needs.   Labs/tests ordered: CBC, CMP, TSH

## 2015-05-29 NOTE — Assessment & Plan Note (Addendum)
Resides in AL for care needs, taking Namenda and Aricept for memory preservation.

## 2015-05-31 DIAGNOSIS — E039 Hypothyroidism, unspecified: Secondary | ICD-10-CM | POA: Diagnosis not present

## 2015-05-31 DIAGNOSIS — I10 Essential (primary) hypertension: Secondary | ICD-10-CM | POA: Diagnosis not present

## 2015-05-31 DIAGNOSIS — D649 Anemia, unspecified: Secondary | ICD-10-CM | POA: Diagnosis not present

## 2015-05-31 LAB — BASIC METABOLIC PANEL
BUN: 35 mg/dL — AB (ref 4–21)
CREATININE: 1.1 mg/dL (ref 0.5–1.1)
Glucose: 125 mg/dL
POTASSIUM: 4.2 mmol/L (ref 3.4–5.3)
SODIUM: 139 mmol/L (ref 137–147)

## 2015-05-31 LAB — HEPATIC FUNCTION PANEL
ALK PHOS: 57 U/L (ref 25–125)
ALT: 12 U/L (ref 7–35)
AST: 16 U/L (ref 13–35)
Bilirubin, Total: 0.4 mg/dL

## 2015-05-31 LAB — CBC AND DIFFERENTIAL
HCT: 37 % (ref 36–46)
Hemoglobin: 12.2 g/dL (ref 12.0–16.0)
PLATELETS: 234 10*3/uL (ref 150–399)
WBC: 8 10^3/mL

## 2015-05-31 LAB — TSH: TSH: 7.14 u[IU]/mL — AB (ref 0.41–5.90)

## 2015-06-05 ENCOUNTER — Encounter: Payer: Self-pay | Admitting: Nurse Practitioner

## 2015-06-05 DIAGNOSIS — E039 Hypothyroidism, unspecified: Secondary | ICD-10-CM | POA: Insufficient documentation

## 2015-06-06 DIAGNOSIS — M6281 Muscle weakness (generalized): Secondary | ICD-10-CM | POA: Diagnosis not present

## 2015-06-12 ENCOUNTER — Encounter: Payer: Self-pay | Admitting: Internal Medicine

## 2015-06-12 ENCOUNTER — Non-Acute Institutional Stay: Payer: Medicare Other | Admitting: Internal Medicine

## 2015-06-12 VITALS — BP 136/58 | HR 66 | Temp 97.3°F | Ht 63.0 in | Wt 154.0 lb

## 2015-06-12 DIAGNOSIS — G309 Alzheimer's disease, unspecified: Secondary | ICD-10-CM | POA: Diagnosis not present

## 2015-06-12 DIAGNOSIS — I82502 Chronic embolism and thrombosis of unspecified deep veins of left lower extremity: Secondary | ICD-10-CM | POA: Diagnosis not present

## 2015-06-12 DIAGNOSIS — I10 Essential (primary) hypertension: Secondary | ICD-10-CM | POA: Diagnosis not present

## 2015-06-12 DIAGNOSIS — C4491 Basal cell carcinoma of skin, unspecified: Secondary | ICD-10-CM | POA: Diagnosis not present

## 2015-06-12 DIAGNOSIS — E039 Hypothyroidism, unspecified: Secondary | ICD-10-CM | POA: Diagnosis not present

## 2015-06-12 DIAGNOSIS — F0280 Dementia in other diseases classified elsewhere without behavioral disturbance: Secondary | ICD-10-CM

## 2015-06-12 DIAGNOSIS — R739 Hyperglycemia, unspecified: Secondary | ICD-10-CM | POA: Diagnosis not present

## 2015-06-12 DIAGNOSIS — M6281 Muscle weakness (generalized): Secondary | ICD-10-CM | POA: Diagnosis not present

## 2015-06-12 NOTE — Progress Notes (Signed)
Patient ID: Kayla Weaver, female   DOB: 1922-04-30, 82 y.o.   MRN: [id number]    [facility] Room Number: [id number]  Place of Service: Clinic (12)     No Known Allergies  Chief Complaint  Patient presents with  . Medical Management of Chronic Issues    3 month medication management blood pressure, Alzheimer, hyperglycemia. Here with daughter-in-law Kayla Weaver    HPI:  Patient has made the transition from her independent living apartment into the assisted living area on 09-17-2015. She is happy. She has gained about 10 pounds in the last 2 months. Family is satisfied that she is in the proper area for future care.  Essential hypertension - controlled  Alzheimer disease - stable  Hyperglycemia - controlled with diet  BCC (basal cell carcinoma of skin), nodular - removed by Dr. Elvera Weaver. No relapse.  Hypothyroidism, unspecified hypothyroidism type - TSH in May 2017 was 7.15. She has been started on levothyroxine 25 g daily. TSH is to be repeated in 6 weeks.    Medications: Patient's Medications  New Prescriptions   No medications on file  Previous Medications   ALREX 0.2 % SUSP    Place 1 drop into both eyes 2 (two) times daily.    AMLODIPINE-ATORVASTATIN (CADUET) 5-10 MG TABLET    TAKE ONE TABLET DAILY FOR BLOOD PRESSURE   DOCUSATE SODIUM (COLACE) 100 MG CAPSULE    Take 100 mg by mouth daily.    FLUTICASONE (VERAMYST) 27.5 MCG/SPRAY NASAL SPRAY    Place 1 spray into the nose 2 (two) times daily. Twice daily   LISINOPRIL-HYDROCHLOROTHIAZIDE (PRINZIDE,ZESTORETIC) 10-12.5 MG TABLET    TAKE 1 TABLET ONCE A DAY.   LORATADINE (ALLERGY RELIEF) 10 MG TABLET    Take 10 mg by mouth daily.    MEMANTINE HCL-DONEPEZIL HCL (NAMZARIC) 28-10 MG CP24    Take 1 tablet by mouth daily. To preserve memory   MULTIPLE VITAMINS-MINERALS (ICAPS) CAPS    Take by mouth. Take one tablet twice daily vitamin for eyes  Modified Medications   No medications on file    Discontinued Medications   No medications on file     Review of Systems  Constitutional: Negative for fever, chills, diaphoresis, activity change, appetite change, fatigue and unexpected weight change.  HENT: Positive for hearing loss (Bilateral hearing aids). Negative for congestion, ear discharge, ear pain, postnasal drip, rhinorrhea, sore throat, tinnitus, trouble swallowing and voice change.        History of allergic rhinitis and conjunctivitis  Eyes: Negative for pain, redness, itching and visual disturbance.       History of dry eyes  Respiratory: Negative for cough, choking, shortness of breath and wheezing.   Cardiovascular: Positive for leg swelling (History left DVT. Wears compression stockings bilaterally daily.). Negative for chest pain and palpitations.  Gastrointestinal: Positive for constipation. Negative for nausea, abdominal pain, diarrhea and abdominal distention.  Endocrine: Negative for cold intolerance, heat intolerance, polydipsia, polyphagia and polyuria.       History of hyperglycemia. Previously on metformin, but this was discontinued at some time over the last year because blood sugars began running normal. TSH elevated may 2017. Started levothyroxine.  Genitourinary: Negative for dysuria, urgency, frequency, hematuria, flank pain, vaginal discharge, difficulty urinating and pelvic pain.  Musculoskeletal: Positive for back pain and gait problem (Unstable. Uses cane.). Negative for myalgias, arthralgias, neck pain and neck stiffness.  Skin: Positive for rash (lefft side of the neck and left nape of neck). Negative  for color change and pallor.       Dr. Elvera Weaver remove the lesion of the base of the left neck. Nodular basal cell cancer was found on the pathology report. Sutures are still in place but are due to be removed today at his office.  Allergic/Immunologic: Negative.   Neurological: Negative for dizziness, tremors, seizures, syncope, weakness, numbness and  headaches.       Meningioma partially resected in 2000. Continues to residual meningioma at the base of the brain. Denies headaches or history of seizures. History of progressive memory loss. Had been using Aricept, but this was stopped by the patient, because she could not see that it was making any difference. She has resumed it. She is tolerating the Aricept and Namenda without side effect.  Hematological: Negative for adenopathy. Does not bruise/bleed easily.  Psychiatric/Behavioral: Positive for confusion, sleep disturbance (Responding well to use of zolpidem) and decreased concentration. Negative for suicidal ideas, hallucinations, behavioral problems, dysphoric mood and agitation. The patient is not nervous/anxious and is not hyperactive.     Filed Vitals:   06/12/15 1058  BP: 136/58  Pulse: 66  Temp: 97.3 F (36.3 C)  TempSrc: Oral  Height: 5' 3"  (1.6 m)  Weight: 154 lb (69.854 kg)  SpO2: 97%   Wt Readings from Last 3 Encounters:  06/12/15 154 lb (69.854 kg)  05/29/15 143 lb (64.864 kg)  03/23/15 143 lb (64.864 kg)    Body mass index is 27.29 kg/(m^2).  Physical Exam  Constitutional: She is oriented to person, place, and time. She appears well-developed and well-nourished. No distress.  HENT:  Right Ear: External ear normal.  Left Ear: External ear normal.  Nose: Nose normal.  Mouth/Throat: Oropharynx is clear and moist. No oropharyngeal exudate.  Loss of hearing and use of hearing aids bilaterally.  Eyes: Conjunctivae and EOM are normal. Pupils are equal, round, and reactive to light. No scleral icterus.  Neck: No JVD present. No tracheal deviation present. No thyromegaly present.  Cardiovascular: Normal rate, regular rhythm, normal heart sounds and intact distal pulses.  Exam reveals no gallop and no friction rub.   No murmur heard. Pulmonary/Chest: Effort normal. No respiratory distress. She has no wheezes. She has no rales. She exhibits no tenderness.  Abdominal:  She exhibits no distension and no mass. There is no tenderness.  Musculoskeletal: Normal range of motion. She exhibits edema. She exhibits no tenderness.  Mild gait instability. Trace edema BLE, compression hosiery   Lymphadenopathy:    She has no cervical adenopathy.  Neurological: She is alert and oriented to person, place, and time. No cranial nerve deficit. Coordination normal.  Poor short-term recall. 10/03/2014 MMSE 25/30. Passed clock drawing.  Skin: No rash noted. She is not diaphoretic. No erythema. No pallor.  Cystic appearing lesions on the left neck and left nape of the neck. Has been removed by excisional biopsy by Dr. Elvera Weaver.. Sutures are still in place, but the wound appears fully healed.  Psychiatric: She has a normal mood and affect. Her behavior is normal. Judgment and thought content normal.     Labs reviewed: Lab Summary Latest Ref Rng 05/31/2015 02/03/2015 09/28/2014  Hemoglobin 12.0 - 16.0 g/dL 12.2 12.2 (None)  Hematocrit 36 - 46 % 37 37.1 (None)  White count - 8.0 8.8 (None)  Platelet count 150 - 399 K/L 234 268 (None)  Sodium 137 - 147 mmol/L 139 137 140  Potassium 3.4 - 5.3 mmol/L 4.2 4.3 4.4  Calcium 8.9 - 10.3 mg/dL (None)  9.4 (None)  Phosphorus - (None) (None) (None)  Creatinine 0.5 - 1.1 mg/dL 1.1 1.03(H) 1.2(A)  AST 13 - 35 U/L 16 23 15   Alk Phos 25 - 125 U/L 57 62 57  Bilirubin 0.3 - 1.2 mg/dL (None) 0.4 (None)  Glucose - 125 133(H) 128  Cholesterol 0 - 200 mg/dL (None) (None) 137  HDL cholesterol 35 - 70 mg/dL (None) (None) 39  Triglycerides 40 - 160 mg/dL (None) (None) 163(A)  LDL Direct - (None) (None) (None)  LDL Calc - (None) (None) 65  Total protein 6.5 - 8.1 g/dL (None) 7.0 (None)  Albumin 3.5 - 5.0 g/dL (None) 3.9 (None)   Lab Results  Component Value Date   TSH 7.14* 05/31/2015   Lab Results  Component Value Date   BUN 35* 05/31/2015   BUN 22* 02/03/2015   BUN 30* 09/28/2014   Lab Results  Component Value Date   CREATININE 1.1  05/31/2015   CREATININE 1.03* 02/03/2015   CREATININE 1.2* 09/28/2014   No results found for: HGBA1C     Assessment/Plan  1. Essential hypertension Controlled  2. Alzheimer disease Stable on Namzeric  3. Hyperglycemia Continue to observe. No new medication. Last glucose 125 mg percent on 05/31/2015.  4. BCC (basal cell carcinoma of skin), nodular Surgically removed. No relapse  5. Hypothyroidism, unspecified hypothyroidism type Continue levothyroxine

## 2015-06-14 DIAGNOSIS — I82502 Chronic embolism and thrombosis of unspecified deep veins of left lower extremity: Secondary | ICD-10-CM | POA: Diagnosis not present

## 2015-06-14 DIAGNOSIS — M6281 Muscle weakness (generalized): Secondary | ICD-10-CM | POA: Diagnosis not present

## 2015-06-14 DIAGNOSIS — I10 Essential (primary) hypertension: Secondary | ICD-10-CM | POA: Diagnosis not present

## 2015-06-15 DIAGNOSIS — M6281 Muscle weakness (generalized): Secondary | ICD-10-CM | POA: Diagnosis not present

## 2015-06-15 DIAGNOSIS — I10 Essential (primary) hypertension: Secondary | ICD-10-CM | POA: Diagnosis not present

## 2015-06-15 DIAGNOSIS — I82502 Chronic embolism and thrombosis of unspecified deep veins of left lower extremity: Secondary | ICD-10-CM | POA: Diagnosis not present

## 2015-06-20 DIAGNOSIS — M6281 Muscle weakness (generalized): Secondary | ICD-10-CM | POA: Diagnosis not present

## 2015-06-20 DIAGNOSIS — I10 Essential (primary) hypertension: Secondary | ICD-10-CM | POA: Diagnosis not present

## 2015-06-20 DIAGNOSIS — I82502 Chronic embolism and thrombosis of unspecified deep veins of left lower extremity: Secondary | ICD-10-CM | POA: Diagnosis not present

## 2015-06-27 DIAGNOSIS — I10 Essential (primary) hypertension: Secondary | ICD-10-CM | POA: Diagnosis not present

## 2015-06-27 DIAGNOSIS — M6281 Muscle weakness (generalized): Secondary | ICD-10-CM | POA: Diagnosis not present

## 2015-06-27 DIAGNOSIS — I82502 Chronic embolism and thrombosis of unspecified deep veins of left lower extremity: Secondary | ICD-10-CM | POA: Diagnosis not present

## 2015-06-29 DIAGNOSIS — I10 Essential (primary) hypertension: Secondary | ICD-10-CM | POA: Diagnosis not present

## 2015-06-29 DIAGNOSIS — M6281 Muscle weakness (generalized): Secondary | ICD-10-CM | POA: Diagnosis not present

## 2015-06-29 DIAGNOSIS — I82502 Chronic embolism and thrombosis of unspecified deep veins of left lower extremity: Secondary | ICD-10-CM | POA: Diagnosis not present

## 2015-07-03 DIAGNOSIS — I10 Essential (primary) hypertension: Secondary | ICD-10-CM | POA: Diagnosis not present

## 2015-07-03 DIAGNOSIS — M6281 Muscle weakness (generalized): Secondary | ICD-10-CM | POA: Diagnosis not present

## 2015-07-03 DIAGNOSIS — I82502 Chronic embolism and thrombosis of unspecified deep veins of left lower extremity: Secondary | ICD-10-CM | POA: Diagnosis not present

## 2015-07-04 DIAGNOSIS — I82502 Chronic embolism and thrombosis of unspecified deep veins of left lower extremity: Secondary | ICD-10-CM | POA: Diagnosis not present

## 2015-07-04 DIAGNOSIS — M6281 Muscle weakness (generalized): Secondary | ICD-10-CM | POA: Diagnosis not present

## 2015-07-04 DIAGNOSIS — I10 Essential (primary) hypertension: Secondary | ICD-10-CM | POA: Diagnosis not present

## 2015-07-06 DIAGNOSIS — I82502 Chronic embolism and thrombosis of unspecified deep veins of left lower extremity: Secondary | ICD-10-CM | POA: Diagnosis not present

## 2015-07-06 DIAGNOSIS — M6281 Muscle weakness (generalized): Secondary | ICD-10-CM | POA: Diagnosis not present

## 2015-07-06 DIAGNOSIS — I10 Essential (primary) hypertension: Secondary | ICD-10-CM | POA: Diagnosis not present

## 2015-07-11 DIAGNOSIS — M79622 Pain in left upper arm: Secondary | ICD-10-CM | POA: Diagnosis not present

## 2015-07-11 DIAGNOSIS — M25512 Pain in left shoulder: Secondary | ICD-10-CM | POA: Diagnosis not present

## 2015-07-11 DIAGNOSIS — M6281 Muscle weakness (generalized): Secondary | ICD-10-CM | POA: Diagnosis not present

## 2015-07-13 DIAGNOSIS — M25512 Pain in left shoulder: Secondary | ICD-10-CM | POA: Diagnosis not present

## 2015-07-13 DIAGNOSIS — M79622 Pain in left upper arm: Secondary | ICD-10-CM | POA: Diagnosis not present

## 2015-07-13 DIAGNOSIS — M6281 Muscle weakness (generalized): Secondary | ICD-10-CM | POA: Diagnosis not present

## 2015-07-16 DIAGNOSIS — E039 Hypothyroidism, unspecified: Secondary | ICD-10-CM | POA: Diagnosis not present

## 2015-07-16 LAB — TSH: TSH: 6.18 u[IU]/mL — AB (ref <=5.90)

## 2015-07-17 DIAGNOSIS — M79622 Pain in left upper arm: Secondary | ICD-10-CM | POA: Diagnosis not present

## 2015-07-17 DIAGNOSIS — M25512 Pain in left shoulder: Secondary | ICD-10-CM | POA: Diagnosis not present

## 2015-07-17 DIAGNOSIS — M6281 Muscle weakness (generalized): Secondary | ICD-10-CM | POA: Diagnosis not present

## 2015-07-19 DIAGNOSIS — M6281 Muscle weakness (generalized): Secondary | ICD-10-CM | POA: Diagnosis not present

## 2015-07-19 DIAGNOSIS — M25512 Pain in left shoulder: Secondary | ICD-10-CM | POA: Diagnosis not present

## 2015-07-19 DIAGNOSIS — M79622 Pain in left upper arm: Secondary | ICD-10-CM | POA: Diagnosis not present

## 2015-07-24 DIAGNOSIS — M6281 Muscle weakness (generalized): Secondary | ICD-10-CM | POA: Diagnosis not present

## 2015-07-24 DIAGNOSIS — M79622 Pain in left upper arm: Secondary | ICD-10-CM | POA: Diagnosis not present

## 2015-07-24 DIAGNOSIS — M25512 Pain in left shoulder: Secondary | ICD-10-CM | POA: Diagnosis not present

## 2015-08-09 ENCOUNTER — Non-Acute Institutional Stay: Payer: Medicare Other | Admitting: Nurse Practitioner

## 2015-08-09 ENCOUNTER — Encounter: Payer: Self-pay | Admitting: Nurse Practitioner

## 2015-08-09 DIAGNOSIS — I10 Essential (primary) hypertension: Secondary | ICD-10-CM

## 2015-08-09 DIAGNOSIS — G309 Alzheimer's disease, unspecified: Secondary | ICD-10-CM

## 2015-08-09 DIAGNOSIS — M545 Low back pain: Secondary | ICD-10-CM | POA: Diagnosis not present

## 2015-08-09 DIAGNOSIS — E039 Hypothyroidism, unspecified: Secondary | ICD-10-CM

## 2015-08-09 DIAGNOSIS — G8929 Other chronic pain: Secondary | ICD-10-CM

## 2015-08-09 DIAGNOSIS — F0280 Dementia in other diseases classified elsewhere without behavioral disturbance: Secondary | ICD-10-CM

## 2015-08-09 NOTE — Assessment & Plan Note (Signed)
stable °

## 2015-08-09 NOTE — Assessment & Plan Note (Addendum)
Blood pressure is controlled, continue Amlodipine 5mg , Lisinopril/HCT 10/12.5mg  05/31/15 Na 139, K 4.2, Bun 35, creat 1.10

## 2015-08-09 NOTE — Assessment & Plan Note (Signed)
Resides in [place] for care needs, continue Namenda and Aricept for memory preservation.  

## 2015-08-09 NOTE — Progress Notes (Signed)
Patient ID: Kayla Weaver, female   DOB: 12/18/1922, 82 y.o.   MRN: [id number]  Location[facility]   [facility] Room Number: [id number] Place of Service: [facility] Provider:  Marlana Latus NP    Patient Care Team: Estill Dooms, MD as PCP - General (Internal Medicine) Roger Shelter, MD (Inactive) (Anesthesiology) Estill Dooms, MD as Consulting Physician (Internal Medicine) Kaveh Kissinger Otho Darner, NP as Nurse Practitioner (Nurse Practitioner) Kristeen Miss, MD as Consulting Physician (Neurosurgery) Shon Hough, MD as Consulting Physician (Ophthalmology) [place] (Skilled Nursing and [place]) [name], MD as Consulting Physician (Dermatology)  Extended Emergency Contact Information Primary Emergency Contact: Kayla Weaver Address: [address] [place] [address]          [name] Phone: [phone] Mobile Phone: [phone] Relation: Son  Code Status:  DNR Goals of care: Advanced Directive information Advanced Directives 08/09/2015  Does patient have an advance directive? Yes  Type of Paramedic of [place];Living will  Does patient want to make changes to advanced directive? No - Patient declined  Copy of advanced directive(s) in chart? Yes  Would patient like information on creating an advanced directive? -     Chief Complaint  Patient presents with  . Medical Management of Chronic Issues    HPI:  Pt is a 82 y.o. female seen today for medical management of chronic diseases.  Hx of HTN, controlled, taking Lisinopril/HCT 10/12.5, Amlodipine 5mg . Memory is preserved on Namenda and Aricpet.    Past Medical History:  Diagnosis Date  . Allergic rhinitis   . Asthma   . Carpal tunnel syndrome 1980  . Chronic low back pain   . Constipation 06/27/2014  . GERD (gastroesophageal reflux disease)   . Hearing loss    Hearing aids  . Hyperglycemia 06/27/2014  . Hyperlipidemia 06/27/2014  . Hypertension   .  Insomnia 06/27/2014  . Left leg DVT ([place])    chronic since 2008  . Memory loss 06/27/2014  . Meningioma ([place]) 2000   Dr. Ellene Weaver  . Osteopenia    Past Surgical History:  Procedure Laterality Date  . BACK SURGERY  1999   ruptured disk  . BRAIN TUMOR EXCISION  2000   Dr. Ellene Weaver  . CARPAL TUNNEL RELEASE  1980  . TEAR DUCT PROBING Bilateral 2014   [place]    No Known Allergies    Medication List       Accurate as of 08/09/15  3:11 PM. Always use your most recent med list.          ALLERGY RELIEF 10 MG tablet Generic drug:  loratadine Take 10 mg by mouth daily.   ALREX 0.2 % Susp Generic drug:  loteprednol Place 1 drop into both eyes 2 (two) times daily.   amLODipine-atorvastatin 5-10 MG tablet Commonly known as:  CADUET TAKE ONE TABLET DAILY FOR BLOOD PRESSURE   docusate sodium 100 MG capsule Commonly known as:  COLACE Take 100 mg by mouth daily.   donepezil 10 MG tablet Commonly known as:  ARICEPT Take 10 mg by mouth at bedtime.   fluticasone 27.5 MCG/SPRAY nasal spray Commonly known as:  VERAMYST Place 1 spray into the nose 2 (two) times daily. Twice daily   fluticasone 50 MCG/ACT nasal spray Commonly known as:  FLONASE Place 2 sprays into both nostrils daily.   ICAPS Caps Take by mouth. Take one tablet twice daily vitamin for eyes   levothyroxine 25 MCG tablet  Commonly known as:  SYNTHROID, LEVOTHROID Take 25 mcg by mouth daily before breakfast.   lisinopril-hydrochlorothiazide 10-12.5 MG tablet Commonly known as:  PRINZIDE,ZESTORETIC TAKE 1 TABLET ONCE A DAY.   memantine 10 MG tablet Commonly known as:  NAMENDA Take 10 mg by mouth 2 (two) times daily.       Review of Systems  Constitutional: Negative for activity change, appetite change, chills, diaphoresis, fatigue, fever and unexpected weight change.  HENT: Positive for hearing loss (Bilateral hearing aids). Negative for congestion, ear discharge, ear pain, postnasal drip, rhinorrhea, sore  throat, tinnitus, trouble swallowing and voice change.        History of allergic rhinitis and conjunctivitis  Eyes: Negative for pain, redness, itching and visual disturbance.       History of dry eyes  Respiratory: Negative for cough, choking, shortness of breath and wheezing.   Cardiovascular: Positive for leg swelling (History left DVT. Wears compression stockings bilaterally daily.). Negative for chest pain and palpitations.  Gastrointestinal: Positive for constipation. Negative for abdominal distention, abdominal pain, diarrhea and nausea.  Endocrine: Negative for cold intolerance, heat intolerance, polydipsia, polyphagia and polyuria.       History of hyperglycemia. Previously on metformin, but this was discontinued at some time over the last year because blood sugars began running normal.  Genitourinary: Negative for difficulty urinating, dysuria, flank pain, frequency, hematuria, pelvic pain, urgency and vaginal discharge.  Musculoskeletal: Positive for back pain and gait problem (Unstable. Uses cane.). Negative for arthralgias, myalgias, neck pain and neck stiffness.  Skin: Positive for rash (lefft side of the neck and left nape of neck). Negative for color change and pallor.       Dr. Elvera Lennox remove the lesion of the base of the left neck. Nodular basal cell cancer was found on the pathology report. Sutures are still in place but are due to be removed today at his office.  Allergic/Immunologic: Negative.   Neurological: Negative for dizziness, tremors, seizures, syncope, weakness, numbness and headaches.       Meningioma partially resected in 2000. Continues to residual meningioma at the base of the brain. Denies headaches or history of seizures. History of progressive memory loss. Had been using Aricept, but this was stopped by the patient, because she could not see that it was making any difference. She has resumed it. She is tolerating the Aricept and Namenda without side effect.    Hematological: Negative for adenopathy. Does not bruise/bleed easily.  Psychiatric/Behavioral: Positive for confusion, decreased concentration and sleep disturbance (Responding well to use of zolpidem). Negative for agitation, behavioral problems, dysphoric mood, hallucinations and suicidal ideas. The patient is not nervous/anxious and is not hyperactive.     Immunization History  Administered Date(s) Administered  . Influenza-Unspecified 10/20/2013, 10/05/2014  . PPD Test 10/10/2013  . Pneumococcal Polysaccharide-23 02/06/2009  . Td 10/11/2010  . Zoster 01/08/2012   Pertinent  Health Maintenance Due  Topic Date Due  . DEXA SCAN  10/06/1989  . PNA vac Low Risk Adult (2 of 2 - PCV13) 02/06/2010  . INFLUENZA VACCINE  08/07/2015   Fall Risk  06/12/2015 06/12/2015 03/13/2015 01/09/2015 12/05/2014  Falls in the past year? No No Yes No No  Number falls in past yr: - - 1 - -  Injury with Fall? - - No - -   Functional Status Survey:    Vitals:   08/09/15 1002  BP: 124/60  Pulse: 70  Resp: 20  Temp: 97.3 F (36.3 C)  Weight: 155 lb (70.3  kg)  Height: 5\' 3"  (1.6 m)   Body mass index is 27.46 kg/m. Physical Exam  Constitutional: She is oriented to person, place, and time. She appears well-developed and well-nourished. No distress.  HENT:  Right Ear: External ear normal.  Left Ear: External ear normal.  Nose: Nose normal.  Mouth/Throat: Oropharynx is clear and moist. No oropharyngeal exudate.  Loss of hearing and use of hearing aids bilaterally.  Eyes: Conjunctivae and EOM are normal. Pupils are equal, round, and reactive to light. No scleral icterus.  Neck: No JVD present. No tracheal deviation present. No thyromegaly present.  Cardiovascular: Normal rate, regular rhythm, normal heart sounds and intact distal pulses.  Exam reveals no gallop and no friction rub.   No murmur heard. Pulmonary/Chest: Effort normal. No respiratory distress. She has no wheezes. She has no rales. She  exhibits no tenderness.  Abdominal: She exhibits no distension and no mass. There is no tenderness.  Musculoskeletal: Normal range of motion. She exhibits edema. She exhibits no tenderness.  Mild gait instability. Trace edema BLE, compression hosiery   Lymphadenopathy:    She has no cervical adenopathy.  Neurological: She is alert and oriented to person, place, and time. No cranial nerve deficit. Coordination normal.  Poor short-term recall. 10/03/2014 MMSE 25/30. Passed clock drawing.  Skin: No rash noted. She is not diaphoretic. No erythema. No pallor.  Cystic appearing lesions on the left neck and left nape of the neck. Has been removed by excisional biopsy by Dr. Elvera Lennox.. Sutures are still in place, but the wound appears fully healed.  Psychiatric: She has a normal mood and affect. Her behavior is normal. Judgment and thought content normal.    Labs reviewed:  Recent Labs  09/28/14 02/03/15 1152 05/31/15  NA 140 137 139  K 4.4 4.3 4.2  CL  --  100*  --   CO2  --  26  --   GLUCOSE  --  133*  --   BUN 30* 22* 35*  CREATININE 1.2* 1.03* 1.1  CALCIUM  --  9.4  --     Recent Labs  09/28/14 02/03/15 1152 05/31/15  AST 15 23 16   ALT 11 16 12   ALKPHOS 57 62 57  BILITOT  --  0.4  --   PROT  --  7.0  --   ALBUMIN  --  3.9  --     Recent Labs  02/03/15 1152 05/31/15  WBC 8.8 8.0  NEUTROABS 5.5  --   HGB 12.2 12.2  HCT 37.1 37  MCV 95.1  --   PLT 268 234   Lab Results  Component Value Date   TSH 6.18 (A) 07/16/2015   No results found for: HGBA1C Lab Results  Component Value Date   CHOL 137 09/28/2014   HDL 39 09/28/2014   LDLCALC 65 09/28/2014   TRIG 163 (A) 09/28/2014    Significant Diagnostic Results in last 30 days:  No results found.  Assessment/Plan  Hypothyroidism 05/31/15 TSH 7.14 06/05/15 Levothyroxine 30mcg daily, TSH 6 weeks.  07/16/15 TSH 6,17 08/09/15 increase levothyroxine 37.29mcg, update TSH in 12 weeks.    Alzheimer disease Resides in  [facility] for care needs, continue Namenda and Aricept for memory preservation.    Chronic low back pain stable  Hypertension Blood pressure is controlled, continue Amlodipine 5mg , Lisinopril/HCT 10/12.5mg  05/31/15 Na 139, K 4.2, Bun 35, creat 1.10     Family/ staff Communication: [place] for care needs.   Labs/tests ordered: TSH 12 weeks.

## 2015-08-09 NOTE — Assessment & Plan Note (Signed)
05/31/15 TSH 7.14 06/05/15 Levothyroxine 78mcg daily, TSH 6 weeks.  07/16/15 TSH 6,17 08/09/15 increase levothyroxine 37.65mcg, update TSH in 12 weeks.

## 2015-09-11 ENCOUNTER — Non-Acute Institutional Stay: Payer: Medicare Other | Admitting: Nurse Practitioner

## 2015-09-11 ENCOUNTER — Encounter: Payer: Self-pay | Admitting: Nurse Practitioner

## 2015-09-11 DIAGNOSIS — K219 Gastro-esophageal reflux disease without esophagitis: Secondary | ICD-10-CM | POA: Diagnosis not present

## 2015-09-11 DIAGNOSIS — G4700 Insomnia, unspecified: Secondary | ICD-10-CM

## 2015-09-11 DIAGNOSIS — I10 Essential (primary) hypertension: Secondary | ICD-10-CM | POA: Diagnosis not present

## 2015-09-11 DIAGNOSIS — G8929 Other chronic pain: Secondary | ICD-10-CM

## 2015-09-11 DIAGNOSIS — M545 Low back pain: Secondary | ICD-10-CM | POA: Diagnosis not present

## 2015-09-11 DIAGNOSIS — K5900 Constipation, unspecified: Secondary | ICD-10-CM

## 2015-09-11 DIAGNOSIS — E039 Hypothyroidism, unspecified: Secondary | ICD-10-CM | POA: Diagnosis not present

## 2015-09-11 DIAGNOSIS — G309 Alzheimer's disease, unspecified: Secondary | ICD-10-CM | POA: Diagnosis not present

## 2015-09-11 DIAGNOSIS — F0280 Dementia in other diseases classified elsewhere without behavioral disturbance: Secondary | ICD-10-CM

## 2015-09-11 NOTE — Assessment & Plan Note (Signed)
Stable, not on acid reducer.  

## 2015-09-11 NOTE — Assessment & Plan Note (Signed)
Resides in [place] for care needs, continue Namenda and Aricept for memory preservation.  

## 2015-09-11 NOTE — Assessment & Plan Note (Signed)
Blood pressure is controlled, continue Amlodipine 5mg , Lisinopril/HCT 10/12.5mg  05/31/15 Na 139, K 4.2, Bun 35, creat 1.10

## 2015-09-11 NOTE — Assessment & Plan Note (Signed)
stable °

## 2015-09-11 NOTE — Progress Notes (Signed)
Location:   [place] Room Number: 17 Place of Service:  ALF [phone]) Provider:  Oluwatomisin Hustead, Manxie  NP  Jeanmarie Hubert, MD  Patient Care Team: Estill Dooms, MD as PCP - General (Internal Medicine) Roger Shelter, MD (Inactive) (Anesthesiology) Estill Dooms, MD as Consulting Physician (Internal Medicine) Priseis Cratty Otho Darner, NP as Nurse Practitioner (Nurse Practitioner) Kristeen Miss, MD as Consulting Physician (Neurosurgery) Shon Hough, MD as Consulting Physician (Ophthalmology) [place] (Skilled Nursing and [place]) [name], MD as Consulting Physician (Dermatology)  Extended Emergency Contact Information Primary Emergency Contact: [name] Address: [place]          [name] Phone: [phone] Mobile Phone: [phone] Relation: Son  Code Status:  Full Code Goals of care: Advanced Directive information Advanced Directives 09/11/2015  Does patient have an advance directive? Yes  Type of Paramedic of [place];Living will  Does patient want to make changes to advanced directive? No - Patient declined  Copy of advanced directive(s) in chart? Yes  Would patient like information on creating an advanced directive? -     Chief Complaint  Patient presents with  . Medical Management of Chronic Issues    HPI:  Pt is a 82 y.o. female seen today for medical management of chronic diseases.    Hx of HTN, controlled, taking Lisinopril/HCT 10/12.5, Amlodipine 5mg . Memory is preserved on Namenda and Aricpet.   Past Medical History:  Diagnosis Date  . Allergic rhinitis   . Asthma   . Carpal tunnel syndrome 1980  . Chronic low back pain   . Constipation 06/27/2014  . GERD (gastroesophageal reflux disease)   . Hearing loss    Hearing aids  . Hyperglycemia 06/27/2014  . Hyperlipidemia 06/27/2014  . Hypertension   . Insomnia 06/27/2014  . Left leg DVT ([place])    chronic since 2008  . Memory loss 06/27/2014  . Meningioma ([place]) 2000   Dr. Ellene Route  . Osteopenia    Past Surgical History:  Procedure Laterality Date  . BACK SURGERY  1999   ruptured disk  . BRAIN TUMOR EXCISION  2000   Dr. Ellene Route  . CARPAL TUNNEL RELEASE  1980  . TEAR DUCT PROBING Bilateral 2014   Saluidon    No Known Allergies    Medication List       Accurate as of 09/11/15  1:16 PM. Always use your most recent med list.          ALLERGY RELIEF 10 MG tablet Generic drug:  loratadine Take 10 mg by mouth daily.   ALREX 0.2 % Susp Generic drug:  loteprednol Place 1 drop into both eyes 2 (two) times daily.   amLODipine-atorvastatin 5-10 MG tablet Commonly known as:  CADUET TAKE ONE TABLET DAILY FOR BLOOD PRESSURE   docusate sodium 100 MG capsule Commonly known as:  COLACE Take 100 mg by mouth daily.   donepezil 10 MG tablet Commonly known as:  ARICEPT Take 10 mg by mouth at bedtime.   fluticasone 50 MCG/ACT nasal spray Commonly known as:  FLONASE Place 2 sprays into both nostrils daily.   ICAPS Caps Take by mouth. Take one tablet twice daily vitamin for eyes   levothyroxine 25 MCG tablet Commonly known as:  SYNTHROID, LEVOTHROID Take 25 mcg by mouth daily before breakfast.   lisinopril-hydrochlorothiazide 10-12.5 MG tablet Commonly known as:  PRINZIDE,ZESTORETIC TAKE 1 TABLET ONCE A DAY.   memantine 10  MG tablet Commonly known as:  NAMENDA Take 10 mg by mouth 2 (two) times daily.       Review of Systems  Constitutional: Negative for activity change, appetite change, chills, diaphoresis, fatigue, fever and unexpected weight change.  HENT: Positive for hearing loss (Bilateral hearing aids). Negative for congestion, ear discharge, ear pain, postnasal drip, rhinorrhea, sore throat, tinnitus, trouble swallowing and voice change.        History of allergic rhinitis and conjunctivitis  Eyes: Negative for pain, redness, itching and visual disturbance.        History of dry eyes  Respiratory: Negative for cough, choking, shortness of breath and wheezing.   Cardiovascular: Positive for leg swelling (History left DVT. Wears compression stockings bilaterally daily.). Negative for chest pain and palpitations.  Gastrointestinal: Positive for constipation. Negative for abdominal distention, abdominal pain, diarrhea and nausea.  Endocrine: Negative for cold intolerance, heat intolerance, polydipsia, polyphagia and polyuria.       History of hyperglycemia. Previously on metformin, but this was discontinued at some time over the last year because blood sugars began running normal.  Genitourinary: Negative for difficulty urinating, dysuria, flank pain, frequency, hematuria, pelvic pain, urgency and vaginal discharge.  Musculoskeletal: Positive for back pain and gait problem (Unstable. Uses cane.). Negative for arthralgias, myalgias, neck pain and neck stiffness.  Skin: Positive for rash (lefft side of the neck and left nape of neck). Negative for color change and pallor.       Dr. Elvera Lennox remove the lesion of the base of the left neck. Nodular basal cell cancer was found on the pathology report. Sutures are still in place but are due to be removed today at his office.  Allergic/Immunologic: Negative.   Neurological: Negative for dizziness, tremors, seizures, syncope, weakness, numbness and headaches.       Meningioma partially resected in 2000. Continues to residual meningioma at the base of the brain. Denies headaches or history of seizures. History of progressive memory loss. Had been using Aricept, but this was stopped by the patient, because she could not see that it was making any difference. She has resumed it. She is tolerating the Aricept and Namenda without side effect.  Hematological: Negative for adenopathy. Does not bruise/bleed easily.  Psychiatric/Behavioral: Positive for confusion, decreased concentration and sleep disturbance (Responding well to  use of zolpidem). Negative for agitation, behavioral problems, dysphoric mood, hallucinations and suicidal ideas. The patient is not nervous/anxious and is not hyperactive.     Immunization History  Administered Date(s) Administered  . Influenza-Unspecified 10/20/2013, 10/05/2014  . PPD Test 10/10/2013  . Pneumococcal Polysaccharide-23 02/06/2009  . Td 10/11/2010  . Zoster 01/08/2012   Pertinent  Health Maintenance Due  Topic Date Due  . DEXA SCAN  10/06/1989  . PNA vac Low Risk Adult (2 of 2 - PCV13) 02/06/2010  . INFLUENZA VACCINE  08/07/2015   Fall Risk  06/12/2015 06/12/2015 03/13/2015 01/09/2015 12/05/2014  Falls in the past year? No No Yes No No  Number falls in past yr: - - 1 - -  Injury with Fall? - - No - -   Functional Status Survey:    Vitals:   09/11/15 0926  BP: (!) 150/67  Pulse: 73  Resp: 20  Temp: 97.6 F (36.4 C)  Weight: 159 lb 3.2 oz (72.2 kg)  Height: 5\' 3"  (1.6 m)   Body mass index is 28.2 kg/m. Physical Exam  Constitutional: She is oriented to person, place, and time. She appears well-developed and well-nourished. No  distress.  HENT:  Right Ear: External ear normal.  Left Ear: External ear normal.  Nose: Nose normal.  Mouth/Throat: Oropharynx is clear and moist. No oropharyngeal exudate.  Loss of hearing and use of hearing aids bilaterally.  Eyes: Conjunctivae and EOM are normal. Pupils are equal, round, and reactive to light. No scleral icterus.  Neck: No JVD present. No tracheal deviation present. No thyromegaly present.  Cardiovascular: Normal rate, regular rhythm, normal heart sounds and intact distal pulses.  Exam reveals no gallop and no friction rub.   No murmur heard. Pulmonary/Chest: Effort normal. No respiratory distress. She has no wheezes. She has no rales. She exhibits no tenderness.  Abdominal: She exhibits no distension and no mass. There is no tenderness.  Musculoskeletal: Normal range of motion. She exhibits edema. She exhibits no  tenderness.  Mild gait instability. Trace edema BLE, compression hosiery   Lymphadenopathy:    She has no cervical adenopathy.  Neurological: She is alert and oriented to person, place, and time. No cranial nerve deficit. Coordination normal.  Poor short-term recall. 10/03/2014 MMSE 25/30. Passed clock drawing.  Skin: No rash noted. She is not diaphoretic. No erythema. No pallor.  Cystic appearing lesions on the left neck and left nape of the neck. Has been removed by excisional biopsy by Dr. Elvera Lennox.. Sutures are still in place, but the wound appears fully healed.  Psychiatric: She has a normal mood and affect. Her behavior is normal. Judgment and thought content normal.    Labs reviewed:  Recent Labs  09/28/14 02/03/15 1152 05/31/15  NA 140 137 139  K 4.4 4.3 4.2  CL  --  100*  --   CO2  --  26  --   GLUCOSE  --  133*  --   BUN 30* 22* 35*  CREATININE 1.2* 1.03* 1.1  CALCIUM  --  9.4  --     Recent Labs  09/28/14 02/03/15 1152 05/31/15  AST 15 23 16   ALT 11 16 12   ALKPHOS 57 62 57  BILITOT  --  0.4  --   PROT  --  7.0  --   ALBUMIN  --  3.9  --     Recent Labs  02/03/15 1152 05/31/15  WBC 8.8 8.0  NEUTROABS 5.5  --   HGB 12.2 12.2  HCT 37.1 37  MCV 95.1  --   PLT 268 234   Lab Results  Component Value Date   TSH 6.18 (A) 07/16/2015   No results found for: HGBA1C Lab Results  Component Value Date   CHOL 137 09/28/2014   HDL 39 09/28/2014   LDLCALC 65 09/28/2014   TRIG 163 (A) 09/28/2014    Significant Diagnostic Results in last 30 days:  No results found.  Assessment/Plan There are no diagnoses linked to this encounter.Hypertension Blood pressure is controlled, continue Amlodipine 5mg , Lisinopril/HCT 10/12.5mg  05/31/15 Na 139, K 4.2, Bun 35, creat 1.10   GERD (gastroesophageal reflux disease) Stable, not on acid reducer.   Constipation Stable, continue diet control, Colace daily.    Hypothyroidism 05/31/15 TSH 7.14 06/05/15 Levothyroxine  89mcg daily, TSH 6 weeks.  07/16/15 TSH 6,17 08/09/15 increase levothyroxine 37.75mcg, update TSH in 12 weeks.     Alzheimer disease Resides in AL for care needs, continue Namenda and Aricept for memory preservation.     Chronic low back pain stable   Insomnia No problem     Family/ staff Communication: AL for continue care needs   Labs/tests ordered:  none

## 2015-09-11 NOTE — Assessment & Plan Note (Signed)
Stable, continue diet control, Colace daily. 

## 2015-09-11 NOTE — Assessment & Plan Note (Signed)
No problem.

## 2015-09-11 NOTE — Assessment & Plan Note (Signed)
05/31/15 TSH 7.14 06/05/15 Levothyroxine 59mcg daily, TSH 6 weeks.  07/16/15 TSH 6,17 08/09/15 increase levothyroxine 37.61mcg, update TSH in 12 weeks.

## 2015-09-13 DIAGNOSIS — R296 Repeated falls: Secondary | ICD-10-CM | POA: Diagnosis not present

## 2015-09-13 DIAGNOSIS — M6281 Muscle weakness (generalized): Secondary | ICD-10-CM | POA: Diagnosis not present

## 2015-09-13 DIAGNOSIS — M25512 Pain in left shoulder: Secondary | ICD-10-CM | POA: Diagnosis not present

## 2015-09-17 DIAGNOSIS — R296 Repeated falls: Secondary | ICD-10-CM | POA: Diagnosis not present

## 2015-09-17 DIAGNOSIS — M6281 Muscle weakness (generalized): Secondary | ICD-10-CM | POA: Diagnosis not present

## 2015-09-17 DIAGNOSIS — M25512 Pain in left shoulder: Secondary | ICD-10-CM | POA: Diagnosis not present

## 2015-09-18 DIAGNOSIS — M6281 Muscle weakness (generalized): Secondary | ICD-10-CM | POA: Diagnosis not present

## 2015-09-18 DIAGNOSIS — R296 Repeated falls: Secondary | ICD-10-CM | POA: Diagnosis not present

## 2015-09-18 DIAGNOSIS — M25512 Pain in left shoulder: Secondary | ICD-10-CM | POA: Diagnosis not present

## 2015-09-25 ENCOUNTER — Non-Acute Institutional Stay: Payer: Medicare Other | Admitting: Nurse Practitioner

## 2015-09-25 ENCOUNTER — Encounter: Payer: Self-pay | Admitting: Nurse Practitioner

## 2015-09-25 DIAGNOSIS — R296 Repeated falls: Secondary | ICD-10-CM | POA: Diagnosis not present

## 2015-09-25 DIAGNOSIS — M545 Low back pain, unspecified: Secondary | ICD-10-CM

## 2015-09-25 DIAGNOSIS — E039 Hypothyroidism, unspecified: Secondary | ICD-10-CM

## 2015-09-25 DIAGNOSIS — G4700 Insomnia, unspecified: Secondary | ICD-10-CM | POA: Diagnosis not present

## 2015-09-25 DIAGNOSIS — K5900 Constipation, unspecified: Secondary | ICD-10-CM

## 2015-09-25 DIAGNOSIS — G309 Alzheimer's disease, unspecified: Secondary | ICD-10-CM | POA: Diagnosis not present

## 2015-09-25 DIAGNOSIS — I10 Essential (primary) hypertension: Secondary | ICD-10-CM

## 2015-09-25 DIAGNOSIS — J069 Acute upper respiratory infection, unspecified: Secondary | ICD-10-CM

## 2015-09-25 DIAGNOSIS — K219 Gastro-esophageal reflux disease without esophagitis: Secondary | ICD-10-CM | POA: Diagnosis not present

## 2015-09-25 DIAGNOSIS — F0280 Dementia in other diseases classified elsewhere without behavioral disturbance: Secondary | ICD-10-CM

## 2015-09-25 DIAGNOSIS — M25512 Pain in left shoulder: Secondary | ICD-10-CM | POA: Diagnosis not present

## 2015-09-25 DIAGNOSIS — G8929 Other chronic pain: Secondary | ICD-10-CM | POA: Diagnosis not present

## 2015-09-25 DIAGNOSIS — M6281 Muscle weakness (generalized): Secondary | ICD-10-CM | POA: Diagnosis not present

## 2015-09-25 NOTE — Progress Notes (Signed)
Location:  [place] Room Number: 17 Place of Service:  ALF [phone]) Provider:  Neal Oshea, Manxie   NP  Jeanmarie Hubert, MD  Patient Care Team: Estill Dooms, MD as PCP - General (Internal Medicine) Roger Shelter, MD (Inactive) (Anesthesiology) Estill Dooms, MD as Consulting Physician (Internal Medicine) Zalma Channing Otho Darner, NP as Nurse Practitioner (Nurse Practitioner) Kristeen Miss, MD as Consulting Physician (Neurosurgery) Shon Hough, MD as Consulting Physician (Ophthalmology) [place] (Skilled Nursing and [place]) [name], MD as Consulting Physician (Dermatology)  Extended Emergency Contact Information Primary Emergency Contact: [name] Address: [place]          [name] Phone: [phone] Mobile Phone: [phone] Relation: Son  Code Status:  DNR Goals of care: Advanced Directive information Advanced Directives 09/25/2015  Does patient have an advance directive? Yes  Type of Paramedic of [place];Living will  Does patient want to make changes to advanced directive? No - Patient declined  Copy of advanced directive(s) in chart? Yes  Would patient like information on creating an advanced directive? -     Chief Complaint  Patient presents with  . Acute Visit    congestion and deep cough    HPI:  Pt is a 82 y.o. female seen today for an acute visit for low grade T 99.3, congestive cough, mild expiratory wheezes appreciated posterior mid to lower lungs, denied chest pain, no O2 desaturation.       Hx of HTN, controlled, taking Lisinopril/HCT 10/12.5, Amlodipine 5mg . Memory is preserved on Namenda and Aricpet. Taking Levothyroxine 37.31mcg, last TSH 6.17 07/16/15  Past Medical History:  Diagnosis Date  . Allergic rhinitis   . Asthma   . Carpal tunnel syndrome 1980  . Chronic low back pain   . Constipation 06/27/2014  . GERD (gastroesophageal  reflux disease)   . Hearing loss    Hearing aids  . Hyperglycemia 06/27/2014  . Hyperlipidemia 06/27/2014  . Hypertension   . Insomnia 06/27/2014  . Left leg DVT ([place])    chronic since 2008  . Memory loss 06/27/2014  . Meningioma ([place]) 2000   Dr. Ellene Route  . Osteopenia    Past Surgical History:  Procedure Laterality Date  . BACK SURGERY  1999   ruptured disk  . BRAIN TUMOR EXCISION  2000   Dr. Ellene Route  . CARPAL TUNNEL RELEASE  1980  . TEAR DUCT PROBING Bilateral 2014   [facility]uidon    No Known Allergies    Medication List       Accurate as of 09/25/15  3:21 PM. Always use your most recent med list.          ALLERGY RELIEF 10 MG tablet Generic drug:  loratadine Take 10 mg by mouth daily.   ALREX 0.2 % Susp Generic drug:  loteprednol Place 1 drop into both eyes as needed.   amLODipine-atorvastatin 5-10 MG tablet Commonly known as:  CADUET TAKE ONE TABLET DAILY FOR BLOOD PRESSURE   docusate sodium 100 MG capsule Commonly known as:  COLACE Take 100 mg by mouth daily.   donepezil 10 MG tablet Commonly known as:  ARICEPT Take 10 mg by mouth at bedtime.   fluticasone 50 MCG/ACT nasal spray Commonly known as:  FLONASE Place 2 sprays into both nostrils daily.   ICAPS Caps Take by mouth. Take one tablet twice daily vitamin for eyes   levothyroxine 25 MCG tablet Commonly known as:  SYNTHROID, LEVOTHROID Take by mouth daily before breakfast. Take 1.5 tablets by mouth daily.   lisinopril-hydrochlorothiazide 10-12.5 MG tablet Commonly known as:  PRINZIDE,ZESTORETIC TAKE 1 TABLET ONCE A DAY.   memantine 10 MG tablet Commonly known as:  NAMENDA Take 10 mg by mouth 2 (two) times daily.       Review of Systems  Constitutional: Negative for activity change, appetite change, chills, diaphoresis, fatigue, fever and unexpected weight change.  HENT: Positive for hearing loss (Bilateral hearing aids). Negative for congestion, ear discharge, ear pain, postnasal drip,  rhinorrhea, sore throat, tinnitus, trouble swallowing and voice change.        History of allergic rhinitis and conjunctivitis  Eyes: Negative for pain, redness, itching and visual disturbance.       History of dry eyes  Respiratory: Positive for cough. Negative for choking, shortness of breath and wheezing.   Cardiovascular: Positive for leg swelling (History left DVT. Wears compression stockings bilaterally daily.). Negative for chest pain and palpitations.  Gastrointestinal: Positive for constipation. Negative for abdominal distention, abdominal pain, diarrhea and nausea.  Endocrine: Negative for cold intolerance, heat intolerance, polydipsia, polyphagia and polyuria.       History of hyperglycemia. Previously on metformin, but this was discontinued at some time over the last year because blood sugars began running normal.  Genitourinary: Negative for difficulty urinating, dysuria, flank pain, frequency, hematuria, pelvic pain, urgency and vaginal discharge.  Musculoskeletal: Positive for back pain and gait problem (Unstable. Uses cane.). Negative for arthralgias, myalgias, neck pain and neck stiffness.  Skin: Positive for rash (lefft side of the neck and left nape of neck). Negative for color change and pallor.       Dr. Elvera Lennox remove the lesion of the base of the left neck. Nodular basal cell cancer was found on the pathology report. Sutures are still in place but are due to be removed today at his office.  Allergic/Immunologic: Negative.   Neurological: Negative for dizziness, tremors, seizures, syncope, weakness, numbness and headaches.       Meningioma partially resected in 2000. Continues to residual meningioma at the base of the brain. Denies headaches or history of seizures. History of progressive memory loss. Had been using Aricept, but this was stopped by the patient, because she could not see that it was making any difference. She has resumed it. She is tolerating the Aricept and  Namenda without side effect.  Hematological: Negative for adenopathy. Does not bruise/bleed easily.  Psychiatric/Behavioral: Positive for confusion, decreased concentration and sleep disturbance (Responding well to use of zolpidem). Negative for agitation, behavioral problems, dysphoric mood, hallucinations and suicidal ideas. The patient is not nervous/anxious and is not hyperactive.     Immunization History  Administered Date(s) Administered  . Influenza-Unspecified 10/20/2013, 10/05/2014  . PPD Test 10/10/2013  . Pneumococcal Polysaccharide-23 02/06/2009  . Td 10/11/2010  . Zoster 01/08/2012   Pertinent  Health Maintenance Due  Topic Date Due  . DEXA SCAN  10/06/1989  . PNA vac Low Risk Adult (2 of 2 - PCV13) 02/06/2010  . INFLUENZA VACCINE  08/07/2015   Fall Risk  06/12/2015 06/12/2015 03/13/2015 01/09/2015 12/05/2014  Falls in the past year? No No Yes No No  Number falls in past yr: - - 1 - -  Injury with Fall? - - No - -   Functional Status Survey:    Vitals:   09/25/15 1402  BP: (!) 139/58  Pulse: 75  Resp: 20  Temp: 97.9 F (36.6 C)  Weight: 159  lb 12.8 oz (72.5 kg)  Height: 5\' 1"  (1.549 m)   Body mass index is 30.19 kg/m. Physical Exam  Constitutional: She is oriented to person, place, and time. She appears well-developed and well-nourished. No distress.  HENT:  Right Ear: External ear normal.  Left Ear: External ear normal.  Nose: Nose normal.  Mouth/Throat: Oropharynx is clear and moist. No oropharyngeal exudate.  Loss of hearing and use of hearing aids bilaterally.  Eyes: Conjunctivae and EOM are normal. Pupils are equal, round, and reactive to light. No scleral icterus.  Neck: No JVD present. No tracheal deviation present. No thyromegaly present.  Cardiovascular: Normal rate, regular rhythm, normal heart sounds and intact distal pulses.  Exam reveals no gallop and no friction rub.   No murmur heard. Pulmonary/Chest: Effort normal. No respiratory distress. She  has wheezes. She has no rales. She exhibits no tenderness.  Abdominal: She exhibits no distension and no mass. There is no tenderness.  Musculoskeletal: Normal range of motion. She exhibits edema. She exhibits no tenderness.  Mild gait instability. Trace edema BLE, compression hosiery   Lymphadenopathy:    She has no cervical adenopathy.  Neurological: She is alert and oriented to person, place, and time. No cranial nerve deficit. Coordination normal.  Poor short-term recall. 10/03/2014 MMSE 25/30. Passed clock drawing.  Skin: No rash noted. She is not diaphoretic. No erythema. No pallor.  Cystic appearing lesions on the left neck and left nape of the neck. Has been removed by excisional biopsy by Dr. Elvera Lennox.. Sutures are still in place, but the wound appears fully healed.  Psychiatric: She has a normal mood and affect. Her behavior is normal. Judgment and thought content normal.    Labs reviewed:  Recent Labs  09/28/14 02/03/15 1152 05/31/15  NA 140 137 139  K 4.4 4.3 4.2  CL  --  100*  --   CO2  --  26  --   GLUCOSE  --  133*  --   BUN 30* 22* 35*  CREATININE 1.2* 1.03* 1.1  CALCIUM  --  9.4  --     Recent Labs  09/28/14 02/03/15 1152 05/31/15  AST 15 23 16   ALT 11 16 12   ALKPHOS 57 62 57  BILITOT  --  0.4  --   PROT  --  7.0  --   ALBUMIN  --  3.9  --     Recent Labs  02/03/15 1152 05/31/15  WBC 8.8 8.0  NEUTROABS 5.5  --   HGB 12.2 12.2  HCT 37.1 37  MCV 95.1  --   PLT 268 234   Lab Results  Component Value Date   TSH 6.18 (A) 07/16/2015   No results found for: HGBA1C Lab Results  Component Value Date   CHOL 137 09/28/2014   HDL 39 09/28/2014   LDLCALC 65 09/28/2014   TRIG 163 (A) 09/28/2014    Significant Diagnostic Results in last 30 days:  No results found.  Assessment/Plan There are no diagnoses linked to this encounter.Acute upper respiratory infection CXR to r/o PNA, update CBC CMP. Will complete 5 day course of Augmentin 875mg  bid,  Mucinex 500mg  bid, and medrol dose pk.   Hypothyroidism 05/31/15 TSH 7.14 06/05/15 Levothyroxine 31mcg daily, TSH 6 weeks.  07/16/15 TSH 6.17 08/09/15 increase levothyroxine 37.78mcg, update TSH in 12 weeks.     Hypertension Blood pressure is controlled, continue Amlodipine 5mg , Lisinopril/HCT 10/12.5mg  05/31/15 Na 139, K 4.2, Bun 35, creat 1.10    GERD (  gastroesophageal reflux disease) Stable, not on acid reducer.    Constipation Stable, continue diet control, Colace daily.    Alzheimer disease Resides in AL for care needs, continue Namenda and Aricept for memory preservation.      Chronic low back pain stable   Insomnia No problem     Family/ staff Communication: continue AL for care assistance.   Labs/tests ordered:  CBC, CMP, CXR

## 2015-09-25 NOTE — Assessment & Plan Note (Signed)
Stable, not on acid reducer.  

## 2015-09-25 NOTE — Assessment & Plan Note (Signed)
stable °

## 2015-09-25 NOTE — Assessment & Plan Note (Signed)
No problem.

## 2015-09-25 NOTE — Assessment & Plan Note (Signed)
Stable, continue diet control, Colace daily. 

## 2015-09-25 NOTE — Assessment & Plan Note (Signed)
CXR to r/o PNA, update CBC CMP. Will complete 5 day course of Augmentin 875mg  bid, Mucinex 500mg  bid, and medrol dose pk.

## 2015-09-25 NOTE — Assessment & Plan Note (Signed)
Blood pressure is controlled, continue Amlodipine 5mg , Lisinopril/HCT 10/12.5mg  05/31/15 Na 139, K 4.2, Bun 35, creat 1.10

## 2015-09-25 NOTE — Assessment & Plan Note (Signed)
05/31/15 TSH 7.14 06/05/15 Levothyroxine 71mcg daily, TSH 6 weeks.  07/16/15 TSH 6.17 08/09/15 increase levothyroxine 37.32mcg, update TSH in 12 weeks.

## 2015-09-25 NOTE — Assessment & Plan Note (Signed)
Resides in [place] for care needs, continue Namenda and Aricept for memory preservation.  

## 2015-09-26 DIAGNOSIS — M25512 Pain in left shoulder: Secondary | ICD-10-CM | POA: Diagnosis not present

## 2015-09-26 DIAGNOSIS — M6281 Muscle weakness (generalized): Secondary | ICD-10-CM | POA: Diagnosis not present

## 2015-09-26 DIAGNOSIS — R296 Repeated falls: Secondary | ICD-10-CM | POA: Diagnosis not present

## 2015-09-27 DIAGNOSIS — D649 Anemia, unspecified: Secondary | ICD-10-CM | POA: Diagnosis not present

## 2015-09-27 DIAGNOSIS — R296 Repeated falls: Secondary | ICD-10-CM | POA: Diagnosis not present

## 2015-09-27 DIAGNOSIS — M25512 Pain in left shoulder: Secondary | ICD-10-CM | POA: Diagnosis not present

## 2015-09-27 DIAGNOSIS — I10 Essential (primary) hypertension: Secondary | ICD-10-CM | POA: Diagnosis not present

## 2015-09-27 DIAGNOSIS — M6281 Muscle weakness (generalized): Secondary | ICD-10-CM | POA: Diagnosis not present

## 2015-09-27 LAB — HEPATIC FUNCTION PANEL
ALT: 16 U/L (ref 7–35)
AST: 16 U/L (ref 13–35)
Alkaline Phosphatase: 45 U/L (ref 25–125)
Bilirubin, Total: 0.4 mg/dL

## 2015-09-27 LAB — BASIC METABOLIC PANEL
BUN: 36 mg/dL — AB (ref 4–21)
Creatinine: 1.1 mg/dL (ref <=1.1)
GLUCOSE: 223 mg/dL
Potassium: 4.2 mmol/L (ref 3.4–5.3)
Sodium: 139 mmol/L (ref 137–147)

## 2015-09-27 LAB — CBC AND DIFFERENTIAL
HCT: 36 % (ref 36–46)
HEMOGLOBIN: 12.1 g/dL (ref 12.0–16.0)
Platelets: 256 10*3/uL (ref 150–399)
WBC: 9.4 10*3/mL

## 2015-10-02 ENCOUNTER — Other Ambulatory Visit: Payer: Self-pay | Admitting: *Deleted

## 2015-10-03 DIAGNOSIS — M6281 Muscle weakness (generalized): Secondary | ICD-10-CM | POA: Diagnosis not present

## 2015-10-03 DIAGNOSIS — R296 Repeated falls: Secondary | ICD-10-CM | POA: Diagnosis not present

## 2015-10-03 DIAGNOSIS — M25512 Pain in left shoulder: Secondary | ICD-10-CM | POA: Diagnosis not present

## 2015-10-05 DIAGNOSIS — M25512 Pain in left shoulder: Secondary | ICD-10-CM | POA: Diagnosis not present

## 2015-10-05 DIAGNOSIS — M6281 Muscle weakness (generalized): Secondary | ICD-10-CM | POA: Diagnosis not present

## 2015-10-05 DIAGNOSIS — R296 Repeated falls: Secondary | ICD-10-CM | POA: Diagnosis not present

## 2015-10-08 DIAGNOSIS — M6281 Muscle weakness (generalized): Secondary | ICD-10-CM | POA: Diagnosis not present

## 2015-10-08 DIAGNOSIS — M25512 Pain in left shoulder: Secondary | ICD-10-CM | POA: Diagnosis not present

## 2015-10-08 DIAGNOSIS — M79622 Pain in left upper arm: Secondary | ICD-10-CM | POA: Diagnosis not present

## 2015-10-09 ENCOUNTER — Encounter: Payer: Self-pay | Admitting: Internal Medicine

## 2015-10-09 ENCOUNTER — Non-Acute Institutional Stay: Payer: Medicare Other | Admitting: Internal Medicine

## 2015-10-09 VITALS — BP 126/56 | HR 73 | Temp 97.4°F | Ht 61.0 in | Wt 160.0 lb

## 2015-10-09 DIAGNOSIS — M79622 Pain in left upper arm: Secondary | ICD-10-CM | POA: Diagnosis not present

## 2015-10-09 DIAGNOSIS — I10 Essential (primary) hypertension: Secondary | ICD-10-CM

## 2015-10-09 DIAGNOSIS — M25512 Pain in left shoulder: Secondary | ICD-10-CM | POA: Diagnosis not present

## 2015-10-09 DIAGNOSIS — G309 Alzheimer's disease, unspecified: Secondary | ICD-10-CM

## 2015-10-09 DIAGNOSIS — F0280 Dementia in other diseases classified elsewhere without behavioral disturbance: Secondary | ICD-10-CM

## 2015-10-09 DIAGNOSIS — J069 Acute upper respiratory infection, unspecified: Secondary | ICD-10-CM | POA: Diagnosis not present

## 2015-10-09 DIAGNOSIS — R739 Hyperglycemia, unspecified: Secondary | ICD-10-CM | POA: Diagnosis not present

## 2015-10-09 DIAGNOSIS — M6281 Muscle weakness (generalized): Secondary | ICD-10-CM | POA: Diagnosis not present

## 2015-10-09 NOTE — Progress Notes (Signed)
Progress Note     [place] Room Number: WO03  Place of Service: Clinic (12)     No Known Allergies  Chief Complaint  Patient presents with  . Acute Visit    on 10/06/15 while being served coffee some was accidentaly spilled on patient's left arm. No pain, redness, or swelling noted.  . Cough    HPI:  Coffee spilled on her left arm on 10/06/15. No residual damage or pains. No blisters or erythema.  Recent cough. Was put on Augmentin 09/25/15. Also prescribed Medrol oack and CXR. CXR was without acute cardiopulmonary change. CBC and CMP were also done on 09/27/15 . CBC normal. CMP: glu 223. BUN 36, Creat 1.12.  Patient says she is feeling fine.  Medications: Patient's Medications  New Prescriptions   No medications on file  Previous Medications   ALREX 0.2 % SUSP    Place 1 drop into both eyes as needed.    AMLODIPINE-ATORVASTATIN (CADUET) 5-10 MG TABLET    TAKE ONE TABLET DAILY FOR BLOOD PRESSURE   DOCUSATE SODIUM (COLACE) 100 MG CAPSULE    Take 100 mg by mouth daily.    DONEPEZIL (ARICEPT) 10 MG TABLET    Take 10 mg by mouth at bedtime.   FLUTICASONE (FLONASE) 50 MCG/ACT NASAL SPRAY    Place 2 sprays into both nostrils daily.   LEVOTHYROXINE (SYNTHROID, LEVOTHROID) 25 MCG TABLET    Take by mouth daily before breakfast. Take 1.5 tablets by mouth daily.   LISINOPRIL-HYDROCHLOROTHIAZIDE (PRINZIDE,ZESTORETIC) 10-12.5 MG TABLET    TAKE 1 TABLET ONCE A DAY.   LORATADINE (ALLERGY RELIEF) 10 MG TABLET    Take 10 mg by mouth daily.    MEMANTINE (NAMENDA) 10 MG TABLET    Take 10 mg by mouth 2 (two) times daily.   MULTIPLE VITAMINS-MINERALS (ICAPS) CAPS    Take by mouth. Take one tablet twice daily vitamin for eyes  Modified Medications   No medications on file  Discontinued Medications   No medications on file     Review of Systems  Constitutional: Negative for activity change, appetite change, chills, diaphoresis, fatigue, fever and unexpected weight  change.  HENT: Positive for hearing loss (Bilateral hearing aids). Negative for congestion, ear discharge, ear pain, postnasal drip, rhinorrhea, sore throat, tinnitus, trouble swallowing and voice change.        History of allergic rhinitis and conjunctivitis  Eyes: Negative for pain, redness, itching and visual disturbance.       History of dry eyes  Respiratory: Negative for cough, choking, shortness of breath and wheezing.   Cardiovascular: Positive for leg swelling (History left DVT. Wears compression stockings bilaterally daily.). Negative for chest pain and palpitations.  Gastrointestinal: Positive for constipation. Negative for abdominal distention, abdominal pain, diarrhea and nausea.  Endocrine: Negative for cold intolerance, heat intolerance, polydipsia, polyphagia and polyuria.       History of hyperglycemia. Previously on metformin, but this was discontinued at some time over the last year because blood sugars began running normal. TSH elevated may 2017. Started levothyroxine.  Genitourinary: Negative for difficulty urinating, dysuria, flank pain, frequency, hematuria, pelvic pain, urgency and vaginal discharge.  Musculoskeletal: Positive for back pain and gait problem (Unstable. Uses cane.). Negative for arthralgias, myalgias, neck pain and neck stiffness.  Skin: Positive for rash (lefft side of the neck and left nape of neck). Negative for color change and pallor.       Dr. Elvera Lennox remove the lesion of the  base of the left neck. Nodular basal cell cancer was found on the pathology report. Sutures are still in place but are due to be removed today at his office.  Allergic/Immunologic: Negative.   Neurological: Negative for dizziness, tremors, seizures, syncope, weakness, numbness and headaches.       Meningioma partially resected in 2000. Continues to residual meningioma at the base of the brain. Denies headaches or history of seizures. History of progressive memory loss. Had been  using Aricept, but this was stopped by the patient, because she could not see that it was making any difference. She has resumed it. She is tolerating the Aricept and Namenda without side effect.  Hematological: Negative for adenopathy. Does not bruise/bleed easily.  Psychiatric/Behavioral: Positive for confusion, decreased concentration and sleep disturbance (Responding well to use of zolpidem). Negative for agitation, behavioral problems, dysphoric mood, hallucinations and suicidal ideas. The patient is not nervous/anxious and is not hyperactive.     Vitals:   10/09/15 1029  BP: (!) 126/56  Pulse: 73  Temp: 97.4 F (36.3 C)  TempSrc: Oral  SpO2: 96%  Weight: 160 lb (72.6 kg)  Height: 5' 1" (1.549 m)   Wt Readings from Last 3 Encounters:  10/09/15 160 lb (72.6 kg)  09/25/15 159 lb 12.8 oz (72.5 kg)  09/11/15 159 lb 3.2 oz (72.2 kg)    Body mass index is 30.23 kg/m.  Physical Exam  Constitutional: She is oriented to person, place, and time. She appears well-developed and well-nourished. No distress.  HENT:  Right Ear: External ear normal.  Left Ear: External ear normal.  Nose: Nose normal.  Mouth/Throat: Oropharynx is clear and moist. No oropharyngeal exudate.  Loss of hearing and use of hearing aids bilaterally.  Eyes: Conjunctivae and EOM are normal. Pupils are equal, round, and reactive to light. No scleral icterus.  Neck: No JVD present. No tracheal deviation present. No thyromegaly present.  Cardiovascular: Normal rate, regular rhythm, normal heart sounds and intact distal pulses.  Exam reveals no gallop and no friction rub.   No murmur heard. Pulmonary/Chest: Effort normal. No respiratory distress. She has no wheezes. She has no rales. She exhibits no tenderness.  Abdominal: She exhibits no distension and no mass. There is no tenderness.  Musculoskeletal: Normal range of motion. She exhibits edema. She exhibits no tenderness.  Mild gait instability. Trace edema BLE,  compression hosiery   Lymphadenopathy:    She has no cervical adenopathy.  Neurological: She is alert and oriented to person, place, and time. No cranial nerve deficit. Coordination normal.  Poor short-term recall. 10/03/2014 MMSE 25/30. Passed clock drawing.  Skin: No rash noted. She is not diaphoretic. No erythema. No pallor.  Psychiatric: She has a normal mood and affect. Her behavior is normal. Judgment and thought content normal.     Labs reviewed: Lab Summary Latest Ref Rng & Units 09/27/2015 05/31/2015 02/03/2015  Hemoglobin 12.0 - 16.0 g/dL 12.1 12.2 12.2  Hematocrit 36 - 46 % 36 37 37.1  White count 10:3/mL 9.4 8.0 8.8  Platelet count 150 - 399 K/L 256 234 268  Sodium 137 - 147 mmol/L 139 139 137  Potassium 3.4 - 5.3 mmol/L 4.2 4.2 4.3  Calcium 8.9 - 10.3 mg/dL (None) (None) 9.4  Phosphorus - (None) (None) (None)  Creatinine 0.5 - 1.1 mg/dL 1.1 1.1 1.03(H)  AST 13 - 35 U/L _0 Alk Phos 25 - 125 U/L 45 57 62  Bilirubin 0.3 - 1.2 mg/dL (None) (None) 0.4  Glucose mg/dL 223 125 133(H)  Cholesterol - (None) (None) (None)  HDL cholesterol - (None) (None) (None)  Triglycerides - (None) (None) (None)  LDL Direct - (None) (None) (None)  LDL Calc - (None) (None) (None)  Total protein 6.5 - 8.1 g/dL (None) (None) 7.0  Albumin 3.5 - 5.0 g/dL (None) (None) 3.9  Some recent data might be hidden   Lab Results  Component Value Date   TSH 6.18 (A) 07/16/2015   Lab Results  Component Value Date   BUN 36 (A) 09/27/2015   BUN 35 (A) 05/31/2015   BUN 22 (H) 02/03/2015   Lab Results  Component Value Date   CREATININE 1.1 09/27/2015   CREATININE 1.1 05/31/2015   CREATININE 1.03 (H) 02/03/2015   No results found for: HGBA1C     Assessment/Plan  1. Acute upper respiratory infection resolved  2. Essential hypertension controlled  3. Hyperglycemia stable  4. Alzheimer's dementia without behavioral disturbance, unspecified timing of dementia onset unchanged

## 2015-10-11 DIAGNOSIS — M79622 Pain in left upper arm: Secondary | ICD-10-CM | POA: Diagnosis not present

## 2015-10-11 DIAGNOSIS — M6281 Muscle weakness (generalized): Secondary | ICD-10-CM | POA: Diagnosis not present

## 2015-10-11 DIAGNOSIS — M25512 Pain in left shoulder: Secondary | ICD-10-CM | POA: Diagnosis not present

## 2015-10-16 DIAGNOSIS — M6281 Muscle weakness (generalized): Secondary | ICD-10-CM | POA: Diagnosis not present

## 2015-10-16 DIAGNOSIS — M79622 Pain in left upper arm: Secondary | ICD-10-CM | POA: Diagnosis not present

## 2015-10-16 DIAGNOSIS — M25512 Pain in left shoulder: Secondary | ICD-10-CM | POA: Diagnosis not present

## 2015-10-17 DIAGNOSIS — M25512 Pain in left shoulder: Secondary | ICD-10-CM | POA: Diagnosis not present

## 2015-10-17 DIAGNOSIS — M79622 Pain in left upper arm: Secondary | ICD-10-CM | POA: Diagnosis not present

## 2015-10-17 DIAGNOSIS — M6281 Muscle weakness (generalized): Secondary | ICD-10-CM | POA: Diagnosis not present

## 2015-10-22 DIAGNOSIS — M79622 Pain in left upper arm: Secondary | ICD-10-CM | POA: Diagnosis not present

## 2015-10-22 DIAGNOSIS — M25512 Pain in left shoulder: Secondary | ICD-10-CM | POA: Diagnosis not present

## 2015-10-22 DIAGNOSIS — M6281 Muscle weakness (generalized): Secondary | ICD-10-CM | POA: Diagnosis not present

## 2015-10-24 DIAGNOSIS — M79622 Pain in left upper arm: Secondary | ICD-10-CM | POA: Diagnosis not present

## 2015-10-24 DIAGNOSIS — M6281 Muscle weakness (generalized): Secondary | ICD-10-CM | POA: Diagnosis not present

## 2015-10-24 DIAGNOSIS — M25512 Pain in left shoulder: Secondary | ICD-10-CM | POA: Diagnosis not present

## 2015-10-26 DIAGNOSIS — M79622 Pain in left upper arm: Secondary | ICD-10-CM | POA: Diagnosis not present

## 2015-10-26 DIAGNOSIS — M6281 Muscle weakness (generalized): Secondary | ICD-10-CM | POA: Diagnosis not present

## 2015-10-26 DIAGNOSIS — M25512 Pain in left shoulder: Secondary | ICD-10-CM | POA: Diagnosis not present

## 2015-10-29 DIAGNOSIS — M6281 Muscle weakness (generalized): Secondary | ICD-10-CM | POA: Diagnosis not present

## 2015-10-29 DIAGNOSIS — M79622 Pain in left upper arm: Secondary | ICD-10-CM | POA: Diagnosis not present

## 2015-10-29 DIAGNOSIS — M25512 Pain in left shoulder: Secondary | ICD-10-CM | POA: Diagnosis not present

## 2015-10-30 DIAGNOSIS — M6281 Muscle weakness (generalized): Secondary | ICD-10-CM | POA: Diagnosis not present

## 2015-10-30 DIAGNOSIS — M79622 Pain in left upper arm: Secondary | ICD-10-CM | POA: Diagnosis not present

## 2015-10-30 DIAGNOSIS — M25512 Pain in left shoulder: Secondary | ICD-10-CM | POA: Diagnosis not present

## 2015-11-01 DIAGNOSIS — E039 Hypothyroidism, unspecified: Secondary | ICD-10-CM | POA: Diagnosis not present

## 2015-11-01 DIAGNOSIS — M25512 Pain in left shoulder: Secondary | ICD-10-CM | POA: Diagnosis not present

## 2015-11-01 DIAGNOSIS — M79622 Pain in left upper arm: Secondary | ICD-10-CM | POA: Diagnosis not present

## 2015-11-01 DIAGNOSIS — M6281 Muscle weakness (generalized): Secondary | ICD-10-CM | POA: Diagnosis not present

## 2015-11-01 LAB — TSH: TSH: 3.57 u[IU]/mL (ref <=5.90)

## 2015-11-05 DIAGNOSIS — M25512 Pain in left shoulder: Secondary | ICD-10-CM | POA: Diagnosis not present

## 2015-11-05 DIAGNOSIS — M6281 Muscle weakness (generalized): Secondary | ICD-10-CM | POA: Diagnosis not present

## 2015-11-05 DIAGNOSIS — M79622 Pain in left upper arm: Secondary | ICD-10-CM | POA: Diagnosis not present

## 2015-11-06 ENCOUNTER — Other Ambulatory Visit: Payer: Self-pay | Admitting: *Deleted

## 2015-11-09 DIAGNOSIS — M79622 Pain in left upper arm: Secondary | ICD-10-CM | POA: Diagnosis not present

## 2015-11-09 DIAGNOSIS — M6281 Muscle weakness (generalized): Secondary | ICD-10-CM | POA: Diagnosis not present

## 2015-11-09 DIAGNOSIS — M25512 Pain in left shoulder: Secondary | ICD-10-CM | POA: Diagnosis not present

## 2015-11-12 DIAGNOSIS — M6281 Muscle weakness (generalized): Secondary | ICD-10-CM | POA: Diagnosis not present

## 2015-11-12 DIAGNOSIS — M79622 Pain in left upper arm: Secondary | ICD-10-CM | POA: Diagnosis not present

## 2015-11-12 DIAGNOSIS — M25512 Pain in left shoulder: Secondary | ICD-10-CM | POA: Diagnosis not present

## 2015-11-14 DIAGNOSIS — M6281 Muscle weakness (generalized): Secondary | ICD-10-CM | POA: Diagnosis not present

## 2015-11-14 DIAGNOSIS — M79622 Pain in left upper arm: Secondary | ICD-10-CM | POA: Diagnosis not present

## 2015-11-14 DIAGNOSIS — M25512 Pain in left shoulder: Secondary | ICD-10-CM | POA: Diagnosis not present

## 2015-11-16 DIAGNOSIS — M25512 Pain in left shoulder: Secondary | ICD-10-CM | POA: Diagnosis not present

## 2015-11-16 DIAGNOSIS — M79622 Pain in left upper arm: Secondary | ICD-10-CM | POA: Diagnosis not present

## 2015-11-16 DIAGNOSIS — M6281 Muscle weakness (generalized): Secondary | ICD-10-CM | POA: Diagnosis not present

## 2015-11-20 DIAGNOSIS — M25512 Pain in left shoulder: Secondary | ICD-10-CM | POA: Diagnosis not present

## 2015-11-20 DIAGNOSIS — M79622 Pain in left upper arm: Secondary | ICD-10-CM | POA: Diagnosis not present

## 2015-11-20 DIAGNOSIS — M6281 Muscle weakness (generalized): Secondary | ICD-10-CM | POA: Diagnosis not present

## 2015-11-21 DIAGNOSIS — M6281 Muscle weakness (generalized): Secondary | ICD-10-CM | POA: Diagnosis not present

## 2015-11-21 DIAGNOSIS — M25512 Pain in left shoulder: Secondary | ICD-10-CM | POA: Diagnosis not present

## 2015-11-21 DIAGNOSIS — M79622 Pain in left upper arm: Secondary | ICD-10-CM | POA: Diagnosis not present

## 2015-11-23 DIAGNOSIS — M79622 Pain in left upper arm: Secondary | ICD-10-CM | POA: Diagnosis not present

## 2015-11-23 DIAGNOSIS — M25512 Pain in left shoulder: Secondary | ICD-10-CM | POA: Diagnosis not present

## 2015-11-23 DIAGNOSIS — M6281 Muscle weakness (generalized): Secondary | ICD-10-CM | POA: Diagnosis not present

## 2015-11-25 DIAGNOSIS — M25512 Pain in left shoulder: Secondary | ICD-10-CM | POA: Diagnosis not present

## 2015-11-25 DIAGNOSIS — M6281 Muscle weakness (generalized): Secondary | ICD-10-CM | POA: Diagnosis not present

## 2015-11-25 DIAGNOSIS — M79622 Pain in left upper arm: Secondary | ICD-10-CM | POA: Diagnosis not present

## 2015-12-03 DIAGNOSIS — M6281 Muscle weakness (generalized): Secondary | ICD-10-CM | POA: Diagnosis not present

## 2015-12-03 DIAGNOSIS — M25512 Pain in left shoulder: Secondary | ICD-10-CM | POA: Diagnosis not present

## 2015-12-03 DIAGNOSIS — M79622 Pain in left upper arm: Secondary | ICD-10-CM | POA: Diagnosis not present

## 2015-12-04 ENCOUNTER — Non-Acute Institutional Stay: Payer: Medicare Other | Admitting: Nurse Practitioner

## 2015-12-04 ENCOUNTER — Encounter: Payer: Self-pay | Admitting: Nurse Practitioner

## 2015-12-04 DIAGNOSIS — M5440 Lumbago with sciatica, unspecified side: Secondary | ICD-10-CM

## 2015-12-04 DIAGNOSIS — G8929 Other chronic pain: Secondary | ICD-10-CM | POA: Diagnosis not present

## 2015-12-04 DIAGNOSIS — F5101 Primary insomnia: Secondary | ICD-10-CM

## 2015-12-04 DIAGNOSIS — G308 Other Alzheimer's disease: Secondary | ICD-10-CM | POA: Diagnosis not present

## 2015-12-04 DIAGNOSIS — M79622 Pain in left upper arm: Secondary | ICD-10-CM | POA: Diagnosis not present

## 2015-12-04 DIAGNOSIS — F0280 Dementia in other diseases classified elsewhere without behavioral disturbance: Secondary | ICD-10-CM | POA: Diagnosis not present

## 2015-12-04 DIAGNOSIS — M25512 Pain in left shoulder: Secondary | ICD-10-CM | POA: Diagnosis not present

## 2015-12-04 DIAGNOSIS — K5900 Constipation, unspecified: Secondary | ICD-10-CM | POA: Diagnosis not present

## 2015-12-04 DIAGNOSIS — I10 Essential (primary) hypertension: Secondary | ICD-10-CM

## 2015-12-04 DIAGNOSIS — E039 Hypothyroidism, unspecified: Secondary | ICD-10-CM | POA: Diagnosis not present

## 2015-12-04 DIAGNOSIS — K219 Gastro-esophageal reflux disease without esophagitis: Secondary | ICD-10-CM

## 2015-12-04 DIAGNOSIS — M6281 Muscle weakness (generalized): Secondary | ICD-10-CM | POA: Diagnosis not present

## 2015-12-04 NOTE — Assessment & Plan Note (Signed)
Stable

## 2015-12-04 NOTE — Assessment & Plan Note (Signed)
05/31/15 TSH 7.14 06/05/15 Levothyroxine 35mcg daily 07/16/15 TSH 6.17 08/09/15 levothyroxine 37.41mcg 11/01/15 TSH 3.57

## 2015-12-04 NOTE — Assessment & Plan Note (Signed)
Stable, continue diet control, Colace daily. 

## 2015-12-04 NOTE — Assessment & Plan Note (Signed)
Resides in [place] for care needs, continue Namenda and Aricept for memory preservation.  

## 2015-12-04 NOTE — Assessment & Plan Note (Signed)
Stable, not on acid reducer.  

## 2015-12-04 NOTE — Progress Notes (Signed)
Location:  [place] Room Number: 17 Place of Service:  ALF [phone]) Provider:  Demonte Dobratz, Manxie  NP  Jeanmarie Hubert, MD  Patient Care Team: Estill Dooms, MD as PCP - General (Internal Medicine) Roger Shelter, MD (Inactive) (Anesthesiology) Estill Dooms, MD as Consulting Physician (Internal Medicine) Ronnita Paz Otho Darner, NP as Nurse Practitioner (Nurse Practitioner) Kristeen Miss, MD as Consulting Physician (Neurosurgery) Shon Hough, MD as Consulting Physician (Ophthalmology) [place] (Skilled Nursing and [place]) [name], MD as Consulting Physician (Dermatology)  Extended Emergency Contact Information Primary Emergency Contact: [name] Address: [address]          [name] Phone: [phone] Mobile Phone: [phone] Relation: Son  Code Status:  Full Code Goals of care: Advanced Directive information Advanced Directives 12/04/2015  Does Patient Have a Medical Advance Directive? Yes  Type of Paramedic of [place];Living will  Does patient want to make changes to medical advance directive? -  Copy of [place] in Chart? Yes  Would patient like information on creating a medical advance directive? -     Chief Complaint  Patient presents with  . Medical Management of Chronic Issues    HPI:  Pt is a 82 y.o. female seen today for medical management of chronic diseases.                 Hx of HTN, controlled, taking Lisinopril/HCT 10/12.5, Amlodipine 5mg . Memory is preserved on Namenda and Aricpet. Taking Levothyroxine 37.27mcg, last TSH 3.57 11/01/15 Past Medical History:  Diagnosis Date  . Allergic rhinitis   . Asthma   . Carpal tunnel syndrome 1980  . Chronic low back pain   . Constipation 06/27/2014  . GERD (gastroesophageal reflux disease)   . Hearing loss    Hearing aids  . Hyperglycemia 06/27/2014  . Hyperlipidemia  06/27/2014  . Hypertension   . Insomnia 06/27/2014  . Left leg DVT ([place])    chronic since 2008  . Memory loss 06/27/2014  . Meningioma ([place]) 2000   Dr. Ellene Route  . Osteopenia    Past Surgical History:  Procedure Laterality Date  . BACK SURGERY  1999   ruptured disk  . BRAIN TUMOR EXCISION  2000   Dr. Ellene Route  . CARPAL TUNNEL RELEASE  1980  . TEAR DUCT PROBING Bilateral 2014   [place]    No Known Allergies    Medication List       Accurate as of 12/04/15  1:49 PM. Always use your most recent med list.          ALREX 0.2 % Susp Generic drug:  loteprednol Place 1 drop into both eyes as needed.   amLODipine-atorvastatin 5-10 MG tablet Commonly known as:  CADUET TAKE ONE TABLET DAILY FOR BLOOD PRESSURE   docusate sodium 100 MG capsule Commonly known as:  COLACE Take 100 mg by mouth daily.   donepezil 10 MG tablet Commonly known as:  ARICEPT Take 10 mg by mouth at bedtime.   fluticasone 50 MCG/ACT nasal spray Commonly known as:  FLONASE Place 2 sprays into both nostrils daily.   ICAPS Caps Take by mouth. Take one tablet twice daily vitamin for eyes   levothyroxine 25 MCG tablet Commonly known as:  SYNTHROID, LEVOTHROID Take by mouth daily before breakfast. Take 1.5 tablets by mouth daily.   lisinopril-hydrochlorothiazide 10-12.5 MG tablet Commonly known as:  PRINZIDE,ZESTORETIC TAKE 1 TABLET ONCE A  DAY.   memantine 10 MG tablet Commonly known as:  NAMENDA Take 10 mg by mouth 2 (two) times daily.   olopatadine 0.1 % ophthalmic solution Commonly known as:  PATANOL Place 1 drop into both eyes as needed for allergies.       Review of Systems  Constitutional: Negative for activity change, appetite change, chills, diaphoresis, fatigue, fever and unexpected weight change.  HENT: Positive for hearing loss (Bilateral hearing aids). Negative for congestion, ear discharge, ear pain, postnasal drip, rhinorrhea, sore throat, tinnitus, trouble swallowing and voice  change.        History of allergic rhinitis and conjunctivitis  Eyes: Negative for pain, redness, itching and visual disturbance.       History of dry eyes  Respiratory: Negative for cough, choking, shortness of breath and wheezing.   Cardiovascular: Positive for leg swelling (History left DVT. Wears compression stockings bilaterally daily.). Negative for chest pain and palpitations.  Gastrointestinal: Positive for constipation. Negative for abdominal distention, abdominal pain, diarrhea and nausea.  Endocrine: Negative for cold intolerance, heat intolerance, polydipsia, polyphagia and polyuria.       History of hyperglycemia. Previously on metformin, but this was discontinued at some time over the last year because blood sugars began running normal. TSH elevated may 2017. Started levothyroxine.  Genitourinary: Negative for difficulty urinating, dysuria, flank pain, frequency, hematuria, pelvic pain, urgency and vaginal discharge.  Musculoskeletal: Positive for back pain and gait problem (Unstable. Uses cane.). Negative for arthralgias, myalgias, neck pain and neck stiffness.  Skin: Positive for rash (lefft side of the neck and left nape of neck). Negative for color change and pallor.       Dr. Elvera Lennox remove the lesion of the base of the left neck. Nodular basal cell cancer was found on the pathology report. Sutures are still in place but are due to be removed today at his office.  Allergic/Immunologic: Negative.   Neurological: Negative for dizziness, tremors, seizures, syncope, weakness, numbness and headaches.       Meningioma partially resected in 2000. Continues to residual meningioma at the base of the brain. Denies headaches or history of seizures. History of progressive memory loss. Had been using Aricept, but this was stopped by the patient, because she could not see that it was making any difference. She has resumed it. She is tolerating the Aricept and Namenda without side effect.    Hematological: Negative for adenopathy. Does not bruise/bleed easily.  Psychiatric/Behavioral: Positive for confusion, decreased concentration and sleep disturbance (Responding well to use of zolpidem). Negative for agitation, behavioral problems, dysphoric mood, hallucinations and suicidal ideas. The patient is not nervous/anxious and is not hyperactive.     Immunization History  Administered Date(s) Administered  . Influenza-Unspecified 10/20/2013, 10/05/2014, 10/18/2015  . PPD Test 10/10/2013  . Pneumococcal Polysaccharide-23 02/06/2009  . Td 10/11/2010  . Zoster 01/08/2012   Pertinent  Health Maintenance Due  Topic Date Due  . DEXA SCAN  10/06/1989  . PNA vac Low Risk Adult (2 of 2 - PCV13) 02/06/2010  . INFLUENZA VACCINE  Completed   Fall Risk  06/12/2015 06/12/2015 03/13/2015 01/09/2015 12/05/2014  Falls in the past year? No No Yes No No  Number falls in past yr: - - 1 - -  Injury with Fall? - - No - -   Functional Status Survey:    Vitals:   12/04/15 1255  BP: 125/77  Pulse: 74  Resp: 20  Temp: 97.6 F (36.4 C)  SpO2: 93%  Weight: 160  lb (72.6 kg)  Height: 5\' 1"  (1.549 m)   Body mass index is 30.23 kg/m. Physical Exam  Constitutional: She is oriented to person, place, and time. She appears well-developed and well-nourished. No distress.  HENT:  Right Ear: External ear normal.  Left Ear: External ear normal.  Nose: Nose normal.  Mouth/Throat: Oropharynx is clear and moist. No oropharyngeal exudate.  Loss of hearing and use of hearing aids bilaterally.  Eyes: Conjunctivae and EOM are normal. Pupils are equal, round, and reactive to light. No scleral icterus.  Neck: No JVD present. No tracheal deviation present. No thyromegaly present.  Cardiovascular: Normal rate, regular rhythm, normal heart sounds and intact distal pulses.  Exam reveals no gallop and no friction rub.   No murmur heard. Pulmonary/Chest: Effort normal. No respiratory distress. She has no wheezes. She  has no rales. She exhibits no tenderness.  Abdominal: She exhibits no distension and no mass. There is no tenderness.  Musculoskeletal: Normal range of motion. She exhibits edema. She exhibits no tenderness.  Mild gait instability. Trace edema BLE, compression hosiery   Lymphadenopathy:    She has no cervical adenopathy.  Neurological: She is alert and oriented to person, place, and time. No cranial nerve deficit. Coordination normal.  Poor short-term recall. 10/03/2014 MMSE 25/30. Passed clock drawing.  Skin: No rash noted. She is not diaphoretic. No erythema. No pallor.  Psychiatric: She has a normal mood and affect. Her behavior is normal. Judgment and thought content normal.    Labs reviewed:  Recent Labs  02/03/15 1152 05/31/15 09/27/15  NA 137 139 139  K 4.3 4.2 4.2  CL 100*  --   --   CO2 26  --   --   GLUCOSE 133*  --   --   BUN 22* 35* 36*  CREATININE 1.03* 1.1 1.1  CALCIUM 9.4  --   --     Recent Labs  02/03/15 1152 05/31/15 09/27/15  AST 23 16 16   ALT 16 12 16   ALKPHOS 62 57 45  BILITOT 0.4  --   --   PROT 7.0  --   --   ALBUMIN 3.9  --   --     Recent Labs  02/03/15 1152 05/31/15 09/27/15  WBC 8.8 8.0 9.4  NEUTROABS 5.5  --   --   HGB 12.2 12.2 12.1  HCT 37.1 37 36  MCV 95.1  --   --   PLT 268 234 256   Lab Results  Component Value Date   TSH 3.57 11/01/2015   No results found for: HGBA1C Lab Results  Component Value Date   CHOL 137 09/28/2014   HDL 39 09/28/2014   LDLCALC 65 09/28/2014   TRIG 163 (A) 09/28/2014    Significant Diagnostic Results in last 30 days:  No results found.  Assessment/Plan Hypertension Blood pressure is controlled, continue Amlodipine 5mg , Lisinopril/HCT 10/12.5mg  09/27/15 wbc 9.4, Hgb 12.1, plt 256, Na 139, K 4.2, Bun 36, creat 1.12   GERD (gastroesophageal reflux disease) Stable, not on acid reducer.    Constipation Stable, continue diet control, Colace daily.     Hypothyroidism 05/31/15 TSH  7.14 06/05/15 Levothyroxine 110mcg daily 07/16/15 TSH 6.17 08/09/15 levothyroxine 37.108mcg 11/01/15 TSH 3.57  Alzheimer disease Resides in AL for care needs, continue Namenda and Aricept for memory preservation.      Chronic low back pain stable  Insomnia Stable.      Family/ staff Communication: AL  Labs/tests ordered:  none

## 2015-12-04 NOTE — Assessment & Plan Note (Signed)
stable °

## 2015-12-04 NOTE — Assessment & Plan Note (Addendum)
Blood pressure is controlled, continue Amlodipine 5mg , Lisinopril/HCT 10/12.5mg  09/27/15 wbc 9.4, Hgb 12.1, plt 256, Na 139, K 4.2, Bun 36, creat 1.12

## 2015-12-11 DIAGNOSIS — M25512 Pain in left shoulder: Secondary | ICD-10-CM | POA: Diagnosis not present

## 2015-12-11 DIAGNOSIS — M6281 Muscle weakness (generalized): Secondary | ICD-10-CM | POA: Diagnosis not present

## 2015-12-11 DIAGNOSIS — M79622 Pain in left upper arm: Secondary | ICD-10-CM | POA: Diagnosis not present

## 2015-12-13 DIAGNOSIS — M25512 Pain in left shoulder: Secondary | ICD-10-CM | POA: Diagnosis not present

## 2015-12-13 DIAGNOSIS — M79622 Pain in left upper arm: Secondary | ICD-10-CM | POA: Diagnosis not present

## 2015-12-13 DIAGNOSIS — M6281 Muscle weakness (generalized): Secondary | ICD-10-CM | POA: Diagnosis not present

## 2015-12-19 DIAGNOSIS — M25512 Pain in left shoulder: Secondary | ICD-10-CM | POA: Diagnosis not present

## 2015-12-19 DIAGNOSIS — M6281 Muscle weakness (generalized): Secondary | ICD-10-CM | POA: Diagnosis not present

## 2015-12-19 DIAGNOSIS — M79622 Pain in left upper arm: Secondary | ICD-10-CM | POA: Diagnosis not present

## 2016-01-03 DIAGNOSIS — E119 Type 2 diabetes mellitus without complications: Secondary | ICD-10-CM | POA: Diagnosis not present

## 2016-01-03 DIAGNOSIS — H02105 Unspecified ectropion of left lower eyelid: Secondary | ICD-10-CM | POA: Diagnosis not present

## 2016-01-03 DIAGNOSIS — H353132 Nonexudative age-related macular degeneration, bilateral, intermediate dry stage: Secondary | ICD-10-CM | POA: Diagnosis not present

## 2016-01-03 DIAGNOSIS — H52203 Unspecified astigmatism, bilateral: Secondary | ICD-10-CM | POA: Diagnosis not present

## 2016-01-11 ENCOUNTER — Encounter: Payer: Self-pay | Admitting: Nurse Practitioner

## 2016-01-11 ENCOUNTER — Non-Acute Institutional Stay: Payer: Medicare Other | Admitting: Nurse Practitioner

## 2016-01-11 DIAGNOSIS — F0280 Dementia in other diseases classified elsewhere without behavioral disturbance: Secondary | ICD-10-CM

## 2016-01-11 DIAGNOSIS — G308 Other Alzheimer's disease: Secondary | ICD-10-CM | POA: Diagnosis not present

## 2016-01-11 DIAGNOSIS — E039 Hypothyroidism, unspecified: Secondary | ICD-10-CM | POA: Diagnosis not present

## 2016-01-11 DIAGNOSIS — K5900 Constipation, unspecified: Secondary | ICD-10-CM | POA: Diagnosis not present

## 2016-01-11 DIAGNOSIS — J069 Acute upper respiratory infection, unspecified: Secondary | ICD-10-CM

## 2016-01-11 DIAGNOSIS — K219 Gastro-esophageal reflux disease without esophagitis: Secondary | ICD-10-CM | POA: Diagnosis not present

## 2016-01-11 DIAGNOSIS — F5101 Primary insomnia: Secondary | ICD-10-CM | POA: Diagnosis not present

## 2016-01-11 NOTE — Assessment & Plan Note (Signed)
Stable

## 2016-01-11 NOTE — Assessment & Plan Note (Signed)
Nasal congestion, non productive cough, afebrile, no O2 desaturation, denied chest pain, SOB, or palpitation, no noted vomiting or diarrhea. Augmentin 875mg  bid, Mucinex 600mg  bid x 7 days. observe

## 2016-01-11 NOTE — Assessment & Plan Note (Signed)
Stable, not on acid reducer.  

## 2016-01-11 NOTE — Progress Notes (Signed)
Location:  [facility] Room Number: 17 Place of Service:  ALF [id number]) Provider:  Mast, Manxie  NP  Jeanmarie Hubert, MD  Patient Care Team: Estill Dooms, MD as PCP - General (Internal Medicine) Roger Shelter, MD (Inactive) (Anesthesiology) Estill Dooms, MD as Consulting Physician (Internal Medicine) Man Otho Darner, NP as Nurse Practitioner (Nurse Practitioner) Kristeen Miss, MD as Consulting Physician (Neurosurgery) Shon Hough, MD as Consulting Physician (Ophthalmology) [place] ([facility] and [place]) [name], MD as Consulting Physician (Dermatology)  Extended Emergency Contact Information Primary Emergency Contact: [name] Address: [address]          [name] Phone: [phone] Mobile Phone: [phone] Relation: Son  Code Status:  Full Code Goals of care: Advanced Directive information Advanced Directives 01/11/2016  Does Patient Have a Medical Advance Directive? Yes  Type of Paramedic of [place];Living will  Does patient want to make changes to medical advance directive? No - Patient declined  Copy of [place] in Chart? Yes  Would patient like information on creating a medical advance directive? -     Chief Complaint  Patient presents with  . Acute Visit    Cough, congestion, runny nose    HPI:  Pt is a 83 y.o. female seen today for medical management of chronic diseases.   C/o Nasal congestion, non productive cough, afebrile, no O2 desaturation, denied chest pain, SOB, or palpitation, no noted vomiting or diarrhea.  Hx of HTN, controlled, taking Lisinopril/HCT 10/12.5, Amlodipine 5mg . Memory is preserved on Namenda and Aricpet. Taking Levothyroxine 37.46mcg, last TSH 3.57 11/01/15 Past Medical History:  Diagnosis Date  . Allergic rhinitis   . Asthma   . Carpal tunnel syndrome 1980  . Chronic low back pain    . Constipation 06/27/2014  . GERD (gastroesophageal reflux disease)   . Hearing loss    Hearing aids  . Hyperglycemia 06/27/2014  . Hyperlipidemia 06/27/2014  . Hypertension   . Insomnia 06/27/2014  . Left leg DVT ([place])    chronic since 2008  . Memory loss 06/27/2014  . Meningioma ([place]) 2000   Dr. Ellene Route  . Osteopenia    Past Surgical History:  Procedure Laterality Date  . BACK SURGERY  1999   ruptured disk  . BRAIN TUMOR EXCISION  2000   Dr. Ellene Route  . CARPAL TUNNEL RELEASE  1980  . TEAR DUCT PROBING Bilateral 2014   [facility]    No Known Allergies  Allergies as of 01/11/2016   No Known Allergies     Medication List       Accurate as of 01/11/16  3:07 PM. Always use your most recent med list.          ALREX 0.2 % Susp Generic drug:  loteprednol Place 1 drop into both eyes as needed.   amLODipine-atorvastatin 5-10 MG tablet Commonly known as:  CADUET TAKE ONE TABLET DAILY FOR BLOOD PRESSURE   docusate sodium 100 MG capsule Commonly known as:  COLACE Take 100 mg by mouth daily.   donepezil 10 MG tablet Commonly known as:  ARICEPT Take 10 mg by mouth at bedtime.   fluticasone 50 MCG/ACT nasal spray Commonly known as:  FLONASE Place 2 sprays into both nostrils daily.   ICAPS Caps Take by mouth. Take one tablet twice daily vitamin for eyes   levothyroxine 25 MCG tablet Commonly known as:  SYNTHROID, LEVOTHROID Take by  mouth daily before breakfast. Take 1.5 tablets by mouth daily.   lisinopril-hydrochlorothiazide 10-12.5 MG tablet Commonly known as:  PRINZIDE,ZESTORETIC TAKE 1 TABLET ONCE A DAY.   memantine 10 MG tablet Commonly known as:  NAMENDA Take 10 mg by mouth 2 (two) times daily.   olopatadine 0.1 % ophthalmic solution Commonly known as:  PATANOL Place 1 drop into both eyes as needed for allergies.       Review of Systems  Constitutional: Negative for activity change, appetite change, chills, diaphoresis, fatigue, fever and unexpected  weight change.  HENT: Positive for congestion and hearing loss (Bilateral hearing aids). Negative for ear discharge, ear pain, postnasal drip, rhinorrhea, sore throat, tinnitus, trouble swallowing and voice change.        History of allergic rhinitis and conjunctivitis  Eyes: Negative for pain, redness, itching and visual disturbance.       History of dry eyes  Respiratory: Positive for cough. Negative for choking, shortness of breath and wheezing.   Cardiovascular: Positive for leg swelling (History left DVT. Wears compression stockings bilaterally daily.). Negative for chest pain and palpitations.  Gastrointestinal: Positive for constipation. Negative for abdominal distention, abdominal pain, diarrhea and nausea.  Endocrine: Negative for cold intolerance, heat intolerance, polydipsia, polyphagia and polyuria.       History of hyperglycemia. Previously on metformin, but this was discontinued at some time over the last year because blood sugars began running normal. TSH elevated may 2017. Started levothyroxine.  Genitourinary: Negative for difficulty urinating, dysuria, flank pain, frequency, hematuria, pelvic pain, urgency and vaginal discharge.  Musculoskeletal: Positive for back pain and gait problem (Unstable. Uses cane.). Negative for arthralgias, myalgias, neck pain and neck stiffness.  Skin: Positive for rash (lefft side of the neck and left nape of neck). Negative for color change and pallor.       Dr. Elvera Lennox remove the lesion of the base of the left neck. Nodular basal cell cancer was found on the pathology report. Sutures are still in place but are due to be removed today at his office.  Allergic/Immunologic: Negative.   Neurological: Negative for dizziness, tremors, seizures, syncope, weakness, numbness and headaches.       Meningioma partially resected in 2000. Continues to residual meningioma at the base of the brain. Denies headaches or history of seizures. History of progressive  memory loss. Had been using Aricept, but this was stopped by the patient, because she could not see that it was making any difference. She has resumed it. She is tolerating the Aricept and Namenda without side effect.  Hematological: Negative for adenopathy. Does not bruise/bleed easily.  Psychiatric/Behavioral: Positive for confusion, decreased concentration and sleep disturbance (Responding well to use of zolpidem). Negative for agitation, behavioral problems, dysphoric mood, hallucinations and suicidal ideas. The patient is not nervous/anxious and is not hyperactive.     Immunization History  Administered Date(s) Administered  . Influenza-Unspecified 10/20/2013, 10/05/2014, 10/18/2015  . PPD Test 10/10/2013  . Pneumococcal Polysaccharide-23 02/06/2009  . Td 10/11/2010  . Zoster 01/08/2012   Pertinent  Health Maintenance Due  Topic Date Due  . DEXA SCAN  10/06/1989  . PNA vac Low Risk Adult (2 of 2 - PCV13) 02/06/2010  . INFLUENZA VACCINE  Completed   Fall Risk  06/12/2015 06/12/2015 03/13/2015 01/09/2015 12/05/2014  Falls in the past year? No No Yes No No  Number falls in past yr: - - 1 - -  Injury with Fall? - - No - -   Functional Status Survey:  Vitals:   01/11/16 1147  BP: 127/67  Pulse: 81  Resp: 16  Temp: 97.9 F (36.6 C)  SpO2: 96%  Weight: 156 lb 3.2 oz (70.9 kg)  Height: 5\' 1"  (1.549 m)   Body mass index is 29.51 kg/m. Physical Exam  Constitutional: She is oriented to person, place, and time. She appears well-developed and well-nourished. No distress.  HENT:  Right Ear: External ear normal.  Left Ear: External ear normal.  Nose: Nose normal.  Mouth/Throat: Oropharynx is clear and moist. No oropharyngeal exudate.  Loss of hearing and use of hearing aids bilaterally.  Eyes: Conjunctivae and EOM are normal. Pupils are equal, round, and reactive to light. No scleral icterus.  Neck: No JVD present. No tracheal deviation present. No thyromegaly present.    Cardiovascular: Normal rate, regular rhythm, normal heart sounds and intact distal pulses.  Exam reveals no gallop and no friction rub.   No murmur heard. Pulmonary/Chest: Effort normal. No respiratory distress. She has no wheezes. She has no rales. She exhibits no tenderness.  Abdominal: She exhibits no distension and no mass. There is no tenderness.  Musculoskeletal: Normal range of motion. She exhibits edema. She exhibits no tenderness.  Mild gait instability. Trace edema BLE, compression hosiery   Lymphadenopathy:    She has no cervical adenopathy.  Neurological: She is alert and oriented to person, place, and time. No cranial nerve deficit. Coordination normal.  Poor short-term recall. 10/03/2014 MMSE 25/30. Passed clock drawing.  Skin: No rash noted. She is not diaphoretic. No erythema. No pallor.  Psychiatric: She has a normal mood and affect. Her behavior is normal. Judgment and thought content normal.    Labs reviewed:  Recent Labs  02/03/15 1152 05/31/15 09/27/15  NA 137 139 139  K 4.3 4.2 4.2  CL 100*  --   --   CO2 26  --   --   GLUCOSE 133*  --   --   BUN 22* 35* 36*  CREATININE 1.03* 1.1 1.1  CALCIUM 9.4  --   --     Recent Labs  02/03/15 1152 05/31/15 09/27/15  AST 23 16 16   ALT 16 12 16   ALKPHOS 62 57 45  BILITOT 0.4  --   --   PROT 7.0  --   --   ALBUMIN 3.9  --   --     Recent Labs  02/03/15 1152 05/31/15 09/27/15  WBC 8.8 8.0 9.4  NEUTROABS 5.5  --   --   HGB 12.2 12.2 12.1  HCT 37.1 37 36  MCV 95.1  --   --   PLT 268 234 256   Lab Results  Component Value Date   TSH 3.57 11/01/2015   No results found for: HGBA1C Lab Results  Component Value Date   CHOL 137 09/28/2014   HDL 39 09/28/2014   LDLCALC 65 09/28/2014   TRIG 163 (A) 09/28/2014    Significant Diagnostic Results in last 30 days:  No results found.  Assessment/Plan Acute upper respiratory infection Nasal congestion, non productive cough, afebrile, no O2 desaturation,  denied chest pain, SOB, or palpitation, no noted vomiting or diarrhea. Augmentin 875mg  bid, Mucinex 600mg  bid x 7 days. observe  GERD (gastroesophageal reflux disease) Stable, not on acid reducer.     Constipation Stable, continue diet control, Colace daily.  Hypothyroidism 05/31/15 TSH 7.14 06/05/15 Levothyroxine 57mcg daily 07/16/15 TSH 6.17 08/09/15 levothyroxine 37.43mcg 11/01/15 TSH 3.57   Alzheimer disease Resides in AL for care needs,  continue Namenda and Aricept for memory preservation.    Insomnia Stable.      Family/ staff Communication: AL  Labs/tests ordered:  none

## 2016-01-11 NOTE — Assessment & Plan Note (Signed)
05/31/15 TSH 7.14 06/05/15 Levothyroxine 26mcg daily 07/16/15 TSH 6.17 08/09/15 levothyroxine 37.59mcg 11/01/15 TSH 3.57

## 2016-01-11 NOTE — Assessment & Plan Note (Signed)
Stable, continue diet control, Colace daily. 

## 2016-01-11 NOTE — Assessment & Plan Note (Signed)
Resides in [place] for care needs, continue Namenda and Aricept for memory preservation.  

## 2016-01-29 ENCOUNTER — Non-Acute Institutional Stay: Payer: Medicare Other | Admitting: Internal Medicine

## 2016-01-29 ENCOUNTER — Encounter: Payer: Self-pay | Admitting: Internal Medicine

## 2016-01-29 VITALS — BP 120/56 | HR 73 | Temp 97.7°F | Ht 61.0 in | Wt 157.0 lb

## 2016-01-29 DIAGNOSIS — E785 Hyperlipidemia, unspecified: Secondary | ICD-10-CM

## 2016-01-29 DIAGNOSIS — E039 Hypothyroidism, unspecified: Secondary | ICD-10-CM

## 2016-01-29 DIAGNOSIS — I10 Essential (primary) hypertension: Secondary | ICD-10-CM

## 2016-01-29 DIAGNOSIS — F0280 Dementia in other diseases classified elsewhere without behavioral disturbance: Secondary | ICD-10-CM

## 2016-01-29 DIAGNOSIS — G8929 Other chronic pain: Secondary | ICD-10-CM

## 2016-01-29 DIAGNOSIS — M5440 Lumbago with sciatica, unspecified side: Secondary | ICD-10-CM | POA: Diagnosis not present

## 2016-01-29 DIAGNOSIS — G308 Other Alzheimer's disease: Secondary | ICD-10-CM | POA: Diagnosis not present

## 2016-01-29 NOTE — Progress Notes (Signed)
[facility] Room Number: A17  Place of Service: Clinic (12)     No Known Allergies  Chief Complaint  Patient presents with  . Medical Management of Chronic Issues    medication management blood pressure, memory, thyroid, cough    HPI:  Seen 01/11/16 by M. Mast for acute resp illness. Treated with Augmentin and Mucinex.  Alzheimer's disease of other onset without behavioral disturbance - unchanged  Essential hypertension - controlled  Hyperlipidemia, unspecified hyperlipidemia type - controlled  Chronic bilateral low back pain with sciatica, sciatica laterality unspecified - improved  Hypothyroidism, unspecified type - compensated    Medications: Patient's Medications  New Prescriptions   No medications on file  Previous Medications   ALREX 0.2 % SUSP    Place 1 drop into both eyes. Twice a day   AMLODIPINE-ATORVASTATIN (CADUET) 5-10 MG TABLET    TAKE ONE TABLET DAILY FOR BLOOD PRESSURE   DOCUSATE SODIUM (COLACE) 100 MG CAPSULE    Take 100 mg by mouth daily.    DONEPEZIL (ARICEPT) 10 MG TABLET    Take 10 mg by mouth at bedtime.   FLUTICASONE (FLONASE) 50 MCG/ACT NASAL SPRAY    Place 2 sprays into both nostrils daily.   LEVOTHYROXINE (SYNTHROID, LEVOTHROID) 25 MCG TABLET    Take by mouth daily before breakfast. Take 1.5 tablets by mouth daily.   LISINOPRIL-HYDROCHLOROTHIAZIDE (PRINZIDE,ZESTORETIC) 10-12.5 MG TABLET    TAKE 1 TABLET ONCE A DAY.   MEMANTINE (NAMENDA) 10 MG TABLET    Take 10 mg by mouth 2 (two) times daily.   MULTIPLE VITAMINS-MINERALS (ICAPS) CAPS    Take by mouth. Take one tablet twice daily vitamin for eyes   OLOPATADINE (PATANOL) 0.1 % OPHTHALMIC SOLUTION    Place 1 drop into both eyes as needed for allergies.  Modified Medications   No medications on file  Discontinued Medications   No medications on file     Review of Systems  Constitutional: Negative for activity change, appetite change, chills, diaphoresis, fatigue, fever and  unexpected weight change.  HENT: Positive for hearing loss (Bilateral hearing aids). Negative for congestion, ear discharge, ear pain, postnasal drip, rhinorrhea, sore throat, tinnitus, trouble swallowing and voice change.        History of allergic rhinitis and conjunctivitis  Eyes: Negative for pain, redness, itching and visual disturbance.       History of dry eyes  Respiratory: Negative for cough, choking, shortness of breath and wheezing.   Cardiovascular: Positive for leg swelling (History left DVT. Wears compression stockings bilaterally daily.). Negative for chest pain and palpitations.  Gastrointestinal: Positive for constipation. Negative for abdominal distention, abdominal pain, diarrhea and nausea.  Endocrine: Negative for cold intolerance, heat intolerance, polydipsia, polyphagia and polyuria.       History of hyperglycemia. Previously on metformin, but this was discontinued at some time over the last year because blood sugars began running normal. TSH elevated may 2017. Started levothyroxine.  Genitourinary: Negative for difficulty urinating, dysuria, flank pain, frequency, hematuria, pelvic pain, urgency and vaginal discharge.  Musculoskeletal: Positive for back pain and gait problem (Unstable. Uses cane.). Negative for arthralgias, myalgias, neck pain and neck stiffness.  Skin: Positive for rash (lefft side of the neck and left nape of neck). Negative for color change and pallor.       Dr. Elvera Lennox remove the lesion of the base of the left neck. Nodular basal cell cancer was found on the pathology report. Sutures are still in place  but are due to be removed today at his office.  Allergic/Immunologic: Negative.   Neurological: Negative for dizziness, tremors, seizures, syncope, weakness, numbness and headaches.       Meningioma partially resected in 2000. Continues to residual meningioma at the base of the brain. Denies headaches or history of seizures. History of progressive memory  loss. Had been using Aricept, but this was stopped by the patient, because she could not see that it was making any difference. She has resumed it. She is tolerating the Aricept and Namenda without side effect.  Hematological: Negative for adenopathy. Does not bruise/bleed easily.  Psychiatric/Behavioral: Positive for confusion, decreased concentration and sleep disturbance (Responding well to use of zolpidem). Negative for agitation, behavioral problems, dysphoric mood, hallucinations and suicidal ideas. The patient is not nervous/anxious and is not hyperactive.     Vitals:   01/29/16 0940  BP: (!) 120/56  Pulse: 73  Temp: 97.7 F (36.5 C)  TempSrc: Oral  SpO2: 95%  Weight: 157 lb (71.2 kg)  Height: 5' 1"  (1.549 m)   Wt Readings from Last 3 Encounters:  01/29/16 157 lb (71.2 kg)  01/11/16 156 lb 3.2 oz (70.9 kg)  12/04/15 160 lb (72.6 kg)    Body mass index is 29.66 kg/m.  Physical Exam  Constitutional: She is oriented to person, place, and time. She appears well-developed and well-nourished. No distress.  HENT:  Right Ear: External ear normal.  Left Ear: External ear normal.  Nose: Nose normal.  Mouth/Throat: Oropharynx is clear and moist. No oropharyngeal exudate.  Loss of hearing and use of hearing aids bilaterally.  Eyes: Conjunctivae and EOM are normal. Pupils are equal, round, and reactive to light. No scleral icterus.  Neck: No JVD present. No tracheal deviation present. No thyromegaly present.  Cardiovascular: Normal rate, regular rhythm, normal heart sounds and intact distal pulses.  Exam reveals no gallop and no friction rub.   No murmur heard. Pulmonary/Chest: Effort normal. No respiratory distress. She has no wheezes. She has no rales. She exhibits no tenderness.  Abdominal: She exhibits no distension and no mass. There is no tenderness.  Musculoskeletal: Normal range of motion. She exhibits edema. She exhibits no tenderness.  Mild gait instability. Trace edema  BLE, compression hosiery   Lymphadenopathy:    She has no cervical adenopathy.  Neurological: She is alert and oriented to person, place, and time. No cranial nerve deficit. Coordination normal.  Poor short-term recall. 10/03/2014 MMSE 25/30. Passed clock drawing.  Skin: No rash noted. She is not diaphoretic. No erythema. No pallor.  Psychiatric: She has a normal mood and affect. Her behavior is normal. Judgment and thought content normal.     Labs reviewed: Lab Summary Latest Ref Rng & Units 09/27/2015 05/31/2015 02/03/2015  Hemoglobin 12.0 - 16.0 g/dL 12.1 12.2 12.2  Hematocrit 36 - 46 % 36 37 37.1  White count 10:3/mL 9.4 8.0 8.8  Platelet count 150 - 399 K/L 256 234 268  Sodium 137 - 147 mmol/L 139 139 137  Potassium 3.4 - 5.3 mmol/L 4.2 4.2 4.3  Calcium 8.9 - 10.3 mg/dL (None) (None) 9.4  Phosphorus - (None) (None) (None)  Creatinine 0.5 - 1.1 mg/dL 1.1 1.1 1.03(H)  AST 13 - 35 U/L 16 16 23   Alk Phos 25 - 125 U/L 45 57 62  Bilirubin 0.3 - 1.2 mg/dL (None) (None) 0.4  Glucose mg/dL 223 125 133(H)  Cholesterol - (None) (None) (None)  HDL cholesterol - (None) (None) (None)  Triglycerides - (None) (  None) (None)  LDL Direct - (None) (None) (None)  LDL Calc - (None) (None) (None)  Total protein 6.5 - 8.1 g/dL (None) (None) 7.0  Albumin 3.5 - 5.0 g/dL (None) (None) 3.9  Some recent data might be hidden   Lab Results  Component Value Date   TSH 3.57 11/01/2015   Lab Results  Component Value Date   BUN 36 (A) 09/27/2015   BUN 35 (A) 05/31/2015   BUN 22 (H) 02/03/2015   Lab Results  Component Value Date   CREATININE 1.1 09/27/2015   CREATININE 1.1 05/31/2015   CREATININE 1.03 (H) 02/03/2015   No results found for: HGBA1C     Assessment/Plan  1. Alzheimer's disease of other onset without behavioral disturbance unchanged  2. Essential hypertension The current medical regimen is effective;  continue present plan and medications.  3. Hyperlipidemia, unspecified  hyperlipidemia type The current medical regimen is effective;  continue present plan and medications.  4. Chronic bilateral low back pain with sciatica, sciatica laterality unspecified The current medical regimen is effective;  continue present plan and medications.  5. Hypothyroidism, unspecified type The current medical regimen is effective;  continue present plan and medications.

## 2016-02-12 ENCOUNTER — Encounter: Payer: Self-pay | Admitting: Internal Medicine

## 2016-02-29 ENCOUNTER — Encounter: Payer: Self-pay | Admitting: Nurse Practitioner

## 2016-02-29 ENCOUNTER — Non-Acute Institutional Stay: Payer: Medicare Other | Admitting: Nurse Practitioner

## 2016-02-29 DIAGNOSIS — G308 Other Alzheimer's disease: Secondary | ICD-10-CM | POA: Diagnosis not present

## 2016-02-29 DIAGNOSIS — F5101 Primary insomnia: Secondary | ICD-10-CM

## 2016-02-29 DIAGNOSIS — F0280 Dementia in other diseases classified elsewhere without behavioral disturbance: Secondary | ICD-10-CM

## 2016-02-29 DIAGNOSIS — K219 Gastro-esophageal reflux disease without esophagitis: Secondary | ICD-10-CM | POA: Diagnosis not present

## 2016-02-29 DIAGNOSIS — E039 Hypothyroidism, unspecified: Secondary | ICD-10-CM

## 2016-02-29 DIAGNOSIS — K5900 Constipation, unspecified: Secondary | ICD-10-CM | POA: Diagnosis not present

## 2016-02-29 NOTE — Assessment & Plan Note (Signed)
Stable

## 2016-02-29 NOTE — Assessment & Plan Note (Signed)
Stable, not on acid reducer.  

## 2016-02-29 NOTE — Progress Notes (Signed)
Location:  [facility] Room Number: 17 Place of Service:  ALF [phone]) Provider:  Mast, Manxie  NP  Jeanmarie Hubert, MD  Patient Care Team: Estill Dooms, MD as PCP - General (Internal Medicine) Roger Shelter, MD (Inactive) (Anesthesiology) Estill Dooms, MD as Consulting Physician (Internal Medicine) Man Otho Darner, NP as Nurse Practitioner (Nurse Practitioner) Kristeen Miss, MD as Consulting Physician (Neurosurgery) Shon Hough, MD as Consulting Physician (Ophthalmology) [facility] (Skilled Nursing and [place]) [name], MD as Consulting Physician (Dermatology)  Extended Emergency Contact Information Primary Emergency Contact: [name] Address: [address]          [name] Phone: [phone] Mobile Phone: [phone] Relation: Son  Code Status:  Full Code Goals of care: Advanced Directive information Advanced Directives 02/29/2016  Does Patient Have a Medical Advance Directive? Yes  Type of Paramedic of [place];Living will;Out of facility DNR (pink MOST or yellow form)  Does patient want to make changes to medical advance directive? No - Patient declined  Copy of [place] in Chart? Yes  Would patient like information on creating a medical advance directive? -  Pre-existing out of facility DNR order (yellow form or pink MOST form) Yellow form placed in chart (order not valid for inpatient use)     Chief Complaint  Patient presents with  . Medical Management of Chronic Issues    HPI:  Pt is a 83 y.o. female seen today for medical management of chronic diseases.    Hx of HTN, controlled, taking Lisinopril/HCT 10/12.5, Amlodipine 5mg . Memory is preserved on Namenda and Aricpet. Taking Levothyroxine 37.31mcg, last TSH 3.57 11/01/15 Past Medical History:  Diagnosis Date  . Allergic rhinitis   . Asthma   . Carpal tunnel syndrome  1980  . Chronic low back pain   . Constipation 06/27/2014  . GERD (gastroesophageal reflux disease)   . Hearing loss    Hearing aids  . Hyperglycemia 06/27/2014  . Hyperlipidemia 06/27/2014  . Hypertension   . Insomnia 06/27/2014  . Left leg DVT ([place])    chronic since 2008  . Memory loss 06/27/2014  . Meningioma ([place]) 2000   Dr. Ellene Route  . Osteopenia    Past Surgical History:  Procedure Laterality Date  . BACK SURGERY  1999   ruptured disk  . BRAIN TUMOR EXCISION  2000   Dr. Ellene Route  . CARPAL TUNNEL RELEASE  1980  . TEAR DUCT PROBING Bilateral 2014   Saluidon    No Known Allergies  Allergies as of 02/29/2016   No Known Allergies     Medication List       Accurate as of 02/29/16  3:54 PM. Always use your most recent med list.          ALREX 0.2 % Susp Generic drug:  loteprednol Place 1 drop into both eyes. Twice a day   amLODipine-atorvastatin 5-10 MG tablet Commonly known as:  CADUET TAKE ONE TABLET DAILY FOR BLOOD PRESSURE   docusate sodium 100 MG capsule Commonly known as:  COLACE Take 100 mg by mouth daily.   donepezil 10 MG tablet Commonly known as:  ARICEPT Take 10 mg by mouth at bedtime.   fluticasone 50 MCG/ACT nasal spray Commonly known as:  FLONASE Place 2 sprays into both nostrils daily.   ICAPS Caps Take by mouth. Take one tablet twice daily vitamin for eyes   levothyroxine 25 MCG tablet  Commonly known as:  SYNTHROID, LEVOTHROID Take by mouth daily before breakfast. Take 1.5 tablets by mouth daily.   lisinopril-hydrochlorothiazide 10-12.5 MG tablet Commonly known as:  PRINZIDE,ZESTORETIC TAKE 1 TABLET ONCE A DAY.   memantine 10 MG tablet Commonly known as:  NAMENDA Take 10 mg by mouth 2 (two) times daily.   olopatadine 0.1 % ophthalmic solution Commonly known as:  PATANOL Place 1 drop into both eyes as needed for allergies.       Review of Systems  Constitutional: Negative for activity change, appetite change, chills, diaphoresis,  fatigue, fever and unexpected weight change.  HENT: Positive for hearing loss (Bilateral hearing aids). Negative for congestion, ear discharge, ear pain, postnasal drip, rhinorrhea, sore throat, tinnitus, trouble swallowing and voice change.        History of allergic rhinitis and conjunctivitis  Eyes: Negative for pain, redness, itching and visual disturbance.       History of dry eyes  Respiratory: Negative for cough, choking, shortness of breath and wheezing.   Cardiovascular: Positive for leg swelling (History left DVT. Wears compression stockings bilaterally daily.). Negative for chest pain and palpitations.  Gastrointestinal: Positive for constipation. Negative for abdominal distention, abdominal pain, diarrhea and nausea.  Endocrine: Negative for cold intolerance, heat intolerance, polydipsia, polyphagia and polyuria.       History of hyperglycemia. Previously on metformin, but this was discontinued at some time over the last year because blood sugars began running normal. TSH elevated may 2017. Started levothyroxine.  Genitourinary: Negative for difficulty urinating, dysuria, flank pain, frequency, hematuria, pelvic pain, urgency and vaginal discharge.  Musculoskeletal: Positive for back pain and gait problem (Unstable. Uses cane.). Negative for arthralgias, myalgias, neck pain and neck stiffness.  Skin: Positive for rash (lefft side of the neck and left nape of neck). Negative for color change and pallor.       Dr. Elvera Lennox remove the lesion of the base of the left neck. Nodular basal cell cancer was found on the pathology report. Sutures are still in place but are due to be removed today at his office.  Allergic/Immunologic: Negative.   Neurological: Negative for dizziness, tremors, seizures, syncope, weakness, numbness and headaches.       Meningioma partially resected in 2000. Continues to residual meningioma at the base of the brain. Denies headaches or history of seizures. History of  progressive memory loss. Had been using Aricept, but this was stopped by the patient, because she could not see that it was making any difference. She has resumed it. She is tolerating the Aricept and Namenda without side effect.  Hematological: Negative for adenopathy. Does not bruise/bleed easily.  Psychiatric/Behavioral: Positive for confusion, decreased concentration and sleep disturbance (Responding well to use of zolpidem). Negative for agitation, behavioral problems, dysphoric mood, hallucinations and suicidal ideas. The patient is not nervous/anxious and is not hyperactive.     Immunization History  Administered Date(s) Administered  . Influenza-Unspecified 10/20/2013, 10/05/2014, 10/18/2015  . PPD Test 10/10/2013  . Pneumococcal Polysaccharide-23 02/06/2009  . Td 10/11/2010  . Zoster 01/08/2012   Pertinent  Health Maintenance Due  Topic Date Due  . DEXA SCAN  10/06/1989  . PNA vac Low Risk Adult (2 of 2 - PCV13) 02/06/2010  . INFLUENZA VACCINE  Completed   Fall Risk  06/12/2015 06/12/2015 03/13/2015 01/09/2015 12/05/2014  Falls in the past year? No No Yes No No  Number falls in past yr: - - 1 - -  Injury with Fall? - - No - -  Functional Status Survey:    Vitals:   02/29/16 1256  BP: 135/60  Pulse: 75  Resp: 20  Temp: 97.5 F (36.4 C)  SpO2: 95%  Weight: 155 lb 9.6 oz (70.6 kg)  Height: 5\' 1"  (1.549 m)   Body mass index is 29.4 kg/m. Physical Exam  Constitutional: She is oriented to person, place, and time. She appears well-developed and well-nourished. No distress.  HENT:  Right Ear: External ear normal.  Left Ear: External ear normal.  Nose: Nose normal.  Mouth/Throat: Oropharynx is clear and moist. No oropharyngeal exudate.  Loss of hearing and use of hearing aids bilaterally.  Eyes: Conjunctivae and EOM are normal. Pupils are equal, round, and reactive to light. No scleral icterus.  Neck: No JVD present. No tracheal deviation present. No thyromegaly present.    Cardiovascular: Normal rate, regular rhythm, normal heart sounds and intact distal pulses.  Exam reveals no gallop and no friction rub.   No murmur heard. Pulmonary/Chest: Effort normal. No respiratory distress. She has no wheezes. She has no rales. She exhibits no tenderness.  Abdominal: She exhibits no distension and no mass. There is no tenderness.  Musculoskeletal: Normal range of motion. She exhibits edema. She exhibits no tenderness.  Mild gait instability. Trace edema BLE, compression hosiery   Lymphadenopathy:    She has no cervical adenopathy.  Neurological: She is alert and oriented to person, place, and time. No cranial nerve deficit. Coordination normal.  Poor short-term recall. 10/03/2014 MMSE 25/30. Passed clock drawing.  Skin: No rash noted. She is not diaphoretic. No erythema. No pallor.  Psychiatric: She has a normal mood and affect. Her behavior is normal. Judgment and thought content normal.    Labs reviewed:  Recent Labs  05/31/15 09/27/15  NA 139 139  K 4.2 4.2  BUN 35* 36*  CREATININE 1.1 1.1    Recent Labs  05/31/15 09/27/15  AST 16 16  ALT 12 16  ALKPHOS 57 45    Recent Labs  05/31/15 09/27/15  WBC 8.0 9.4  HGB 12.2 12.1  HCT 37 36  PLT 234 256   Lab Results  Component Value Date   TSH 3.57 11/01/2015   No results found for: HGBA1C Lab Results  Component Value Date   CHOL 137 09/28/2014   HDL 39 09/28/2014   LDLCALC 65 09/28/2014   TRIG 163 (A) 09/28/2014    Significant Diagnostic Results in last 30 days:  No results found.  Assessment/Plan GERD (gastroesophageal reflux disease) Stable, not on acid reducer.   Constipation Stable, continue diet control, Colace daily.  Hypothyroidism 08/09/15 levothyroxine 37.72mcg 11/01/15 TSH 3.57 Update CBC CMP TSH  Alzheimer disease Resides in AL for care needs, continue Namenda and Aricept for memory preservation.     Insomnia Stable.      Family/ staff Communication:  AL  Labs/tests ordered:  CBC CMP TSH

## 2016-02-29 NOTE — Assessment & Plan Note (Signed)
08/09/15 levothyroxine 37.81mcg 11/01/15 TSH 3.57 Update CBC CMP TSH

## 2016-02-29 NOTE — Assessment & Plan Note (Signed)
Resides in [place] for care needs, continue Namenda and Aricept for memory preservation.  

## 2016-02-29 NOTE — Assessment & Plan Note (Signed)
Stable, continue diet control, Colace daily. 

## 2016-03-03 DIAGNOSIS — E039 Hypothyroidism, unspecified: Secondary | ICD-10-CM | POA: Diagnosis not present

## 2016-03-03 DIAGNOSIS — I10 Essential (primary) hypertension: Secondary | ICD-10-CM | POA: Diagnosis not present

## 2016-03-03 LAB — BASIC METABOLIC PANEL
BUN: 24 — AB (ref 4–21)
CREATININE: 1.2 — AB (ref 0.5–1.1)
GLUCOSE: 124
Potassium: 4.2 (ref 3.4–5.3)
SODIUM: 140 (ref 137–147)

## 2016-03-03 LAB — TSH: TSH: 4.08 (ref 0.41–5.90)

## 2016-03-03 LAB — CBC AND DIFFERENTIAL
HCT: 38 (ref 36–46)
Hemoglobin: 12.4 (ref 12.0–16.0)
PLATELETS: 273 (ref 150–399)
WBC: 8.5

## 2016-03-03 LAB — HEPATIC FUNCTION PANEL
ALK PHOS: 50 (ref 25–125)
ALT: 20 (ref 7–35)
AST: 19 (ref 13–35)
BILIRUBIN, TOTAL: 0.4

## 2016-03-03 LAB — LIPID PANEL
Cholesterol: 134 (ref 0–200)
HDL: 39 (ref 35–70)
LDL Cholesterol: 62
Triglycerides: 163 — AB (ref 40–160)

## 2016-03-14 DIAGNOSIS — S90211A Contusion of right great toe with damage to nail, initial encounter: Secondary | ICD-10-CM | POA: Diagnosis not present

## 2016-03-14 DIAGNOSIS — L602 Onychogryphosis: Secondary | ICD-10-CM | POA: Diagnosis not present

## 2016-03-24 ENCOUNTER — Encounter: Payer: Self-pay | Admitting: Internal Medicine

## 2016-03-24 ENCOUNTER — Non-Acute Institutional Stay: Payer: Medicare Other | Admitting: Internal Medicine

## 2016-03-24 DIAGNOSIS — K219 Gastro-esophageal reflux disease without esophagitis: Secondary | ICD-10-CM | POA: Diagnosis not present

## 2016-03-24 DIAGNOSIS — G308 Other Alzheimer's disease: Secondary | ICD-10-CM

## 2016-03-24 DIAGNOSIS — M5440 Lumbago with sciatica, unspecified side: Secondary | ICD-10-CM

## 2016-03-24 DIAGNOSIS — G8929 Other chronic pain: Secondary | ICD-10-CM

## 2016-03-24 DIAGNOSIS — I10 Essential (primary) hypertension: Secondary | ICD-10-CM | POA: Diagnosis not present

## 2016-03-24 DIAGNOSIS — F0280 Dementia in other diseases classified elsewhere without behavioral disturbance: Secondary | ICD-10-CM | POA: Diagnosis not present

## 2016-03-24 NOTE — Progress Notes (Signed)
Progress Note    Location:  [facility] Room Number: CV89 Place of Service:  ALF [phone]) Provider:  Jeanmarie Hubert, MD  Patient Care Team: Estill Dooms, MD as PCP - General (Internal Medicine) Roger Shelter, MD (Inactive) (Anesthesiology) Estill Dooms, MD as Consulting Physician (Internal Medicine) Man Otho Darner, NP as Nurse Practitioner (Nurse Practitioner) Kristeen Miss, MD as Consulting Physician (Neurosurgery) Shon Hough, MD as Consulting Physician (Ophthalmology) [facility] (Skilled Nursing and [place]) [name], MD as Consulting Physician (Dermatology)  Extended Emergency Contact Information Primary Emergency Contact: [name] Address: [address]          [name] Phone: [phone] Mobile Phone: [phone] Relation: Son  Code Status:  DNR Goals of care: Advanced Directive information Advanced Directives 03/24/2016  Does Patient Have a Medical Advance Directive? Yes  Type of Paramedic of [place];Living will;Out of facility DNR (pink MOST or yellow form)  Does patient want to make changes to medical advance directive? -  Copy of [place] in Chart? Yes  Would patient like information on creating a medical advance directive? -  Pre-existing out of facility DNR order (yellow form or pink MOST form) Yellow form placed in chart (order not valid for inpatient use)     Chief Complaint  Patient presents with  . Medical Management of Chronic Issues    routine visit    HPI:  Pt is a 83 y.o. female seen today for medical management of chronic diseases.    Alzheimer's disease of other onset without behavioral disturbance - unchanged  Essential hypertension - controlled  Gastroesophageal reflux disease without esophagitis - asymptomatic  Chronic bilateral low back pain with sciatica, sciatica laterality unspecified -  improved. Using cane for ambulation.     Past Medical History:  Diagnosis Date  . Allergic rhinitis   . Asthma   . Carpal tunnel syndrome 1980  . Chronic low back pain   . Constipation 06/27/2014  . GERD (gastroesophageal reflux disease)   . Hearing loss    Hearing aids  . Hyperglycemia 06/27/2014  . Hyperlipidemia 06/27/2014  . Hypertension   . Insomnia 06/27/2014  . Left leg DVT ([place])    chronic since 2008  . Memory loss 06/27/2014  . Meningioma ([place]) 2000   Dr. Ellene Route  . Osteopenia    Past Surgical History:  Procedure Laterality Date  . BACK SURGERY  1999   ruptured disk  . BRAIN TUMOR EXCISION  2000   Dr. Ellene Route  . CARPAL TUNNEL RELEASE  1980  . TEAR DUCT PROBING Bilateral 2014   Saluidon    No Known Allergies  Allergies as of 03/24/2016   No Known Allergies     Medication List       Accurate as of 03/24/16  4:10 PM. Always use your most recent med list.          ALREX 0.2 % Susp Generic drug:  loteprednol Place 1 drop into both eyes. Twice a day   amLODipine-atorvastatin 5-10 MG tablet Commonly known as:  CADUET TAKE ONE TABLET DAILY FOR BLOOD PRESSURE   docusate sodium 100 MG capsule Commonly known as:  COLACE Take 100 mg by mouth daily.   donepezil 10 MG tablet Commonly known as:  ARICEPT Take 10 mg by mouth at bedtime.   fluticasone 50 MCG/ACT nasal spray Commonly known as:  FLONASE Place 2 sprays into both  nostrils daily.   ICAPS Caps Take by mouth. Take one tablet twice daily vitamin for eyes   levothyroxine 25 MCG tablet Commonly known as:  SYNTHROID, LEVOTHROID Take by mouth. Take 1.5 tablets by mouth daily.   lisinopril-hydrochlorothiazide 10-12.5 MG tablet Commonly known as:  PRINZIDE,ZESTORETIC TAKE 1 TABLET ONCE A DAY.   memantine 10 MG tablet Commonly known as:  NAMENDA Take 10 mg by mouth 2 (two) times daily.   olopatadine 0.1 % ophthalmic solution Commonly known as:  PATANOL Place 1 drop into both eyes as needed for  allergies.       Review of Systems  Constitutional: Negative for activity change, appetite change, chills, diaphoresis, fatigue, fever and unexpected weight change.  HENT: Positive for hearing loss (Bilateral hearing aids). Negative for congestion, ear discharge, ear pain, postnasal drip, rhinorrhea, sore throat, tinnitus, trouble swallowing and voice change.        History of allergic rhinitis and conjunctivitis  Eyes: Negative for pain, redness, itching and visual disturbance.       History of dry eyes  Respiratory: Negative for cough, choking, shortness of breath and wheezing.   Cardiovascular: Positive for leg swelling (History left DVT. Wears compression stockings bilaterally daily.). Negative for chest pain and palpitations.  Gastrointestinal: Positive for constipation. Negative for abdominal distention, abdominal pain, diarrhea and nausea.  Endocrine: Negative for cold intolerance, heat intolerance, polydipsia, polyphagia and polyuria.       History of hyperglycemia. Previously on metformin, but this was discontinued at some time over the last year because blood sugars began running normal. TSH elevated may 2017. Started levothyroxine.  Genitourinary: Negative for difficulty urinating, dysuria, flank pain, frequency, hematuria, pelvic pain, urgency and vaginal discharge.  Musculoskeletal: Positive for back pain and gait problem (Unstable. Uses cane.). Negative for arthralgias, myalgias, neck pain and neck stiffness.  Skin: Positive for rash (lefft side of the neck and left nape of neck). Negative for color change and pallor.       Dr. Elvera Lennox remove the lesion of the base of the left neck. Nodular basal cell cancer was found on the pathology report. Sutures are still in place but are due to be removed today at his office.  Allergic/Immunologic: Negative.   Neurological: Negative for dizziness, tremors, seizures, syncope, weakness, numbness and headaches.       Meningioma partially  resected in 2000. Continues to residual meningioma at the base of the brain. Denies headaches or history of seizures. History of progressive memory loss. Had been using Aricept, but this was stopped by the patient, because she could not see that it was making any difference. She has resumed it. She is tolerating the Aricept and Namenda without side effect.  Hematological: Negative for adenopathy. Does not bruise/bleed easily.  Psychiatric/Behavioral: Positive for confusion, decreased concentration and sleep disturbance (Responding well to use of zolpidem). Negative for agitation, behavioral problems, dysphoric mood, hallucinations and suicidal ideas. The patient is not nervous/anxious and is not hyperactive.     Immunization History  Administered Date(s) Administered  . Influenza-Unspecified 10/20/2013, 10/05/2014, 10/18/2015  . PPD Test 10/10/2013  . Pneumococcal Polysaccharide-23 02/06/2009  . Td 10/11/2010  . Zoster 01/08/2012   Pertinent  Health Maintenance Due  Topic Date Due  . DEXA SCAN  10/06/1989  . PNA vac Low Risk Adult (2 of 2 - PCV13) 02/06/2010  . INFLUENZA VACCINE  Completed   Fall Risk  06/12/2015 06/12/2015 03/13/2015 01/09/2015 12/05/2014  Falls in the past year? No No Yes No No  Number falls in past yr: - - 1 - -  Injury with Fall? - - No - -     Vitals:   03/24/16 1604  BP: 124/84  Pulse: 70  Resp: 20  Temp: 97.3 F (36.3 C)  SpO2: 92%  Weight: 158 lb (71.7 kg)  Height: 5\' 1"  (1.549 m)   Body mass index is 29.85 kg/m. Physical Exam  Constitutional: She is oriented to person, place, and time. She appears well-developed and well-nourished. No distress.  HENT:  Right Ear: External ear normal.  Left Ear: External ear normal.  Nose: Nose normal.  Mouth/Throat: Oropharynx is clear and moist. No oropharyngeal exudate.  Loss of hearing and use of hearing aids bilaterally.  Eyes: Conjunctivae and EOM are normal. Pupils are equal, round, and reactive to light. No  scleral icterus.  Neck: No JVD present. No tracheal deviation present. No thyromegaly present.  Cardiovascular: Normal rate, regular rhythm, normal heart sounds and intact distal pulses.  Exam reveals no gallop and no friction rub.   No murmur heard. Pulmonary/Chest: Effort normal. No respiratory distress. She has no wheezes. She has no rales. She exhibits no tenderness.  Abdominal: She exhibits no distension and no mass. There is no tenderness.  Musculoskeletal: Normal range of motion. She exhibits edema. She exhibits no tenderness.  Mild gait instability. Trace edema BLE, compression hosiery   Lymphadenopathy:    She has no cervical adenopathy.  Neurological: She is alert and oriented to person, place, and time. No cranial nerve deficit. Coordination normal.  Poor short-term recall. 10/03/2014 MMSE 25/30. Passed clock drawing.  Skin: No rash noted. She is not diaphoretic. No erythema. No pallor.  Psychiatric: She has a normal mood and affect. Her behavior is normal. Judgment and thought content normal.    Labs reviewed:  Recent Labs  05/31/15 09/27/15  NA 139 139  K 4.2 4.2  BUN 35* 36*  CREATININE 1.1 1.1    Recent Labs  05/31/15 09/27/15  AST 16 16  ALT 12 16  ALKPHOS 57 45    Recent Labs  05/31/15 09/27/15  WBC 8.0 9.4  HGB 12.2 12.1  HCT 37 36  PLT 234 256   Lab Results  Component Value Date   TSH 3.57 11/01/2015   No results found for: HGBA1C Lab Results  Component Value Date   CHOL 137 09/28/2014   HDL 39 09/28/2014   LDLCALC 65 09/28/2014   TRIG 163 (A) 09/28/2014     Assessment/Plan 1. Alzheimer's disease of other onset without behavioral disturbance The current medical regimen is effective;  continue present plan and medications.  2. Essential hypertension The current medical regimen is effective;  continue present plan and medications.  3. Gastroesophageal reflux disease without esophagitis The current medical regimen is effective;  continue  present plan and medications.  4. Chronic bilateral low back pain with sciatica, sciatica laterality unspecified comfortable

## 2016-03-25 NOTE — Addendum Note (Signed)
Addended by: Estill Dooms on: 03/25/2016 05:39 PM   Modules accepted: Level of Service

## 2016-03-25 NOTE — Addendum Note (Signed)
Addended by: Estill Dooms on: 03/25/2016 04:51 PM   Modules accepted: Level of Service

## 2016-04-29 ENCOUNTER — Non-Acute Institutional Stay: Payer: Medicare Other | Admitting: Nurse Practitioner

## 2016-04-29 ENCOUNTER — Encounter: Payer: Self-pay | Admitting: Nurse Practitioner

## 2016-04-29 DIAGNOSIS — F0280 Dementia in other diseases classified elsewhere without behavioral disturbance: Secondary | ICD-10-CM

## 2016-04-29 DIAGNOSIS — K5900 Constipation, unspecified: Secondary | ICD-10-CM

## 2016-04-29 DIAGNOSIS — K219 Gastro-esophageal reflux disease without esophagitis: Secondary | ICD-10-CM | POA: Diagnosis not present

## 2016-04-29 DIAGNOSIS — F5101 Primary insomnia: Secondary | ICD-10-CM | POA: Diagnosis not present

## 2016-04-29 DIAGNOSIS — E039 Hypothyroidism, unspecified: Secondary | ICD-10-CM

## 2016-04-29 DIAGNOSIS — I10 Essential (primary) hypertension: Secondary | ICD-10-CM

## 2016-04-29 DIAGNOSIS — G308 Other Alzheimer's disease: Secondary | ICD-10-CM

## 2016-04-29 NOTE — Assessment & Plan Note (Signed)
Stable, not on acid reducer.  

## 2016-04-29 NOTE — Assessment & Plan Note (Signed)
Blood pressure is controlled, continue Amlodipine 5mg , Lisinopril/HCT 10/12.5mg  03/03/16 wbc 8.5, Hgb 12.4, plt 273, Na 140, K 4.2, Bun 24, creat 1.16, TSH 4.08

## 2016-04-29 NOTE — Assessment & Plan Note (Signed)
She stated sleeps well at night.

## 2016-04-29 NOTE — Assessment & Plan Note (Signed)
Resides in AL for care needs, continue Namenda and Aricept for memory preservation.

## 2016-04-29 NOTE — Assessment & Plan Note (Signed)
Stable, continue diet control, Colace daily.

## 2016-04-29 NOTE — Progress Notes (Signed)
Location:  [facility] Room Number: 17 Place of Service:  ALF [phone]) Provider:  Proctor Carriker, Manxie  NP  Jeanmarie Hubert, MD  Patient Care Team: Estill Dooms, MD as PCP - General (Internal Medicine) Roger Shelter, MD (Inactive) (Anesthesiology) Estill Dooms, MD as Consulting Physician (Internal Medicine) Whitley Strycharz Otho Darner, NP as Nurse Practitioner (Nurse Practitioner) Kristeen Miss, MD as Consulting Physician (Neurosurgery) Shon Hough, MD as Consulting Physician (Ophthalmology) [facility] (Skilled Nursing and [place]) [name], MD as Consulting Physician (Dermatology)  Extended Emergency Contact Information Primary Emergency Contact: [name] Address: [place]          [name] Phone: [phone] Mobile Phone: [phone] Relation: Son  Code Status:  DNR Goals of care: Advanced Directive information Advanced Directives 04/29/2016  Does Patient Have a Medical Advance Directive? Yes  Type of Paramedic of [place];Living will;Out of facility DNR (pink MOST or yellow form)  Does patient want to make changes to medical advance directive? No - Patient declined  Copy of [place] in Chart? Yes  Would patient like information on creating a medical advance directive? -  Pre-existing out of facility DNR order (yellow form or pink MOST form) Yellow form placed in chart (order not valid for inpatient use)     Chief Complaint  Patient presents with  . Medical Management of Chronic Issues    HPI:  Pt is a 83 y.o. female seen today for medical management of chronic diseases.      Hx of HTN, controlled, taking Lisinopril/HCT 10/12.5, Amlodipine 5mg . Memory is preserved on Namenda and Aricpet. Taking Levothyroxine 37.53mcg, last TSH 4.08 03/03/16  Past Medical History:  Diagnosis Date  . Allergic rhinitis   . Asthma   . Carpal tunnel syndrome  1980  . Chronic low back pain   . Constipation 06/27/2014  . GERD (gastroesophageal reflux disease)   . Hearing loss    Hearing aids  . Hyperglycemia 06/27/2014  . Hyperlipidemia 06/27/2014  . Hypertension   . Insomnia 06/27/2014  . Left leg DVT ([place])    chronic since 2008  . Memory loss 06/27/2014  . Meningioma ([place]) 2000   Dr. Ellene Route  . Osteopenia    Past Surgical History:  Procedure Laterality Date  . BACK SURGERY  1999   ruptured disk  . BRAIN TUMOR EXCISION  2000   Dr. Ellene Route  . CARPAL TUNNEL RELEASE  1980  . TEAR DUCT PROBING Bilateral 2014   Saluidon    No Known Allergies  Outpatient Encounter Prescriptions as of 04/29/2016  Medication Sig  . ALREX 0.2 % SUSP Place 1 drop into both eyes. Twice a day  . amLODipine-atorvastatin (CADUET) 5-10 MG tablet TAKE ONE TABLET DAILY FOR BLOOD PRESSURE  . docusate sodium (COLACE) 100 MG capsule Take 100 mg by mouth daily.   Marland Kitchen donepezil (ARICEPT) 10 MG tablet Take 10 mg by mouth at bedtime.  . fluticasone (FLONASE) 50 MCG/ACT nasal spray Place 2 sprays into both nostrils daily.  Marland Kitchen levothyroxine (SYNTHROID, LEVOTHROID) 25 MCG tablet Take by mouth. Take 1.5 tablets by mouth daily.  Marland Kitchen lisinopril-hydrochlorothiazide (PRINZIDE,ZESTORETIC) 10-12.5 MG tablet TAKE 1 TABLET ONCE A DAY.  . memantine (NAMENDA) 10 MG tablet Take 10 mg by mouth 2 (two) times daily.  . Multiple Vitamins-Minerals (ICAPS) CAPS Take by mouth. Take one tablet twice daily vitamin for eyes  . olopatadine (PATANOL) 0.1 % ophthalmic solution  Place 1 drop into both eyes as needed for allergies.   No facility-administered encounter medications on file as of 04/29/2016.     Review of Systems  Constitutional: Negative for activity change, appetite change, chills, diaphoresis, fatigue, fever and unexpected weight change.  HENT: Positive for hearing loss (Bilateral hearing aids). Negative for congestion, ear discharge, ear pain, postnasal drip, rhinorrhea, sore throat,  tinnitus, trouble swallowing and voice change.        History of allergic rhinitis and conjunctivitis  Eyes: Negative for pain, redness, itching and visual disturbance.       History of dry eyes  Respiratory: Negative for cough, choking, shortness of breath and wheezing.   Cardiovascular: Positive for leg swelling (History left DVT. Wears compression stockings bilaterally daily.). Negative for chest pain and palpitations.  Gastrointestinal: Positive for constipation. Negative for abdominal distention, abdominal pain, diarrhea and nausea.  Endocrine: Negative for cold intolerance, heat intolerance, polydipsia, polyphagia and polyuria.       History of hyperglycemia. Previously on metformin, but this was discontinued at some time over the last year because blood sugars began running normal. TSH elevated may 2017. Started levothyroxine.  Genitourinary: Negative for difficulty urinating, dysuria, flank pain, frequency, hematuria, pelvic pain, urgency and vaginal discharge.  Musculoskeletal: Positive for back pain and gait problem (Unstable. Uses cane.). Negative for arthralgias, myalgias, neck pain and neck stiffness.  Skin: Positive for rash (lefft side of the neck and left nape of neck). Negative for color change and pallor.       Dr. Elvera Lennox remove the lesion of the base of the left neck. Nodular basal cell cancer was found on the pathology report. Sutures are still in place but are due to be removed today at his office.  Allergic/Immunologic: Negative.   Neurological: Negative for dizziness, tremors, seizures, syncope, weakness, numbness and headaches.       Meningioma partially resected in 2000. Continues to residual meningioma at the base of the brain. Denies headaches or history of seizures. History of progressive memory loss. Had been using Aricept, but this was stopped by the patient, because she could not see that it was making any difference. She has resumed it. She is tolerating the  Aricept and Namenda without side effect.  Hematological: Negative for adenopathy. Does not bruise/bleed easily.  Psychiatric/Behavioral: Positive for confusion, decreased concentration and sleep disturbance (Responding well to use of zolpidem). Negative for agitation, behavioral problems, dysphoric mood, hallucinations and suicidal ideas. The patient is not nervous/anxious and is not hyperactive.     Immunization History  Administered Date(s) Administered  . Influenza-Unspecified 10/20/2013, 10/05/2014, 10/18/2015  . PPD Test 10/10/2013  . Pneumococcal Polysaccharide-23 02/06/2009  . Td 10/11/2010  . Zoster 01/08/2012   Pertinent  Health Maintenance Due  Topic Date Due  . DEXA SCAN  10/06/1989  . PNA vac Low Risk Adult (2 of 2 - PCV13) 02/06/2010  . INFLUENZA VACCINE  08/06/2016   Fall Risk  06/12/2015 06/12/2015 03/13/2015 01/09/2015 12/05/2014  Falls in the past year? No No Yes No No  Number falls in past yr: - - 1 - -  Injury with Fall? - - No - -   Functional Status Survey:    Vitals:   04/29/16 1413  BP: 137/60  Pulse: 84  Resp: 14  Temp: 97.4 F (36.3 C)  SpO2: 95%  Weight: 158 lb (71.7 kg)  Height: 5\' 1"  (1.549 m)   Body mass index is 29.85 kg/m. Physical Exam  Constitutional: She is oriented to  person, place, and time. She appears well-developed and well-nourished. No distress.  HENT:  Right Ear: External ear normal.  Left Ear: External ear normal.  Nose: Nose normal.  Mouth/Throat: Oropharynx is clear and moist. No oropharyngeal exudate.  Loss of hearing and use of hearing aids bilaterally.  Eyes: Conjunctivae and EOM are normal. Pupils are equal, round, and reactive to light. No scleral icterus.  Neck: No JVD present. No tracheal deviation present. No thyromegaly present.  Cardiovascular: Normal rate, regular rhythm, normal heart sounds and intact distal pulses.  Exam reveals no gallop and no friction rub.   No murmur heard. Pulmonary/Chest: Effort normal. No  respiratory distress. She has no wheezes. She has no rales. She exhibits no tenderness.  Abdominal: She exhibits no distension and no mass. There is no tenderness.  Musculoskeletal: Normal range of motion. She exhibits edema. She exhibits no tenderness.  Mild gait instability. Trace edema BLE, compression hosiery   Lymphadenopathy:    She has no cervical adenopathy.  Neurological: She is alert and oriented to person, place, and time. No cranial nerve deficit. Coordination normal.  Poor short-term recall. 10/03/2014 MMSE 25/30. Passed clock drawing.  Skin: No rash noted. She is not diaphoretic. No erythema. No pallor.  Psychiatric: She has a normal mood and affect. Her behavior is normal. Judgment and thought content normal.    Labs reviewed:  Recent Labs  05/31/15 09/27/15  NA 139 139  K 4.2 4.2  BUN 35* 36*  CREATININE 1.1 1.1    Recent Labs  05/31/15 09/27/15  AST 16 16  ALT 12 16  ALKPHOS 57 45    Recent Labs  05/31/15 09/27/15  WBC 8.0 9.4  HGB 12.2 12.1  HCT 37 36  PLT 234 256   Lab Results  Component Value Date   TSH 3.57 11/01/2015   No results found for: HGBA1C Lab Results  Component Value Date   CHOL 137 09/28/2014   HDL 39 09/28/2014   LDLCALC 65 09/28/2014   TRIG 163 (A) 09/28/2014    Significant Diagnostic Results in last 30 days:  No results found.  Assessment/Plan Hypertension Blood pressure is controlled, continue Amlodipine 5mg , Lisinopril/HCT 10/12.5mg  03/03/16 wbc 8.5, Hgb 12.4, plt 273, Na 140, K 4.2, Bun 24, creat 1.16, TSH 4.08   GERD (gastroesophageal reflux disease) Stable, not on acid reducer.    Constipation Stable, continue diet control, Colace daily.  Hypothyroidism Continue Levothyroxine 37.50mcg, last TSH 4.08 03/03/16  Alzheimer disease Resides in AL for care needs, continue Namenda and Aricept for memory preservation.   Insomnia She stated sleeps well at night.      Family/ staff Communication: AL  Labs/tests  ordered:  none

## 2016-04-29 NOTE — Assessment & Plan Note (Signed)
Continue Levothyroxine 37.39mcg, last TSH 4.08 03/03/16

## 2016-05-27 ENCOUNTER — Non-Acute Institutional Stay: Payer: Medicare Other | Admitting: Nurse Practitioner

## 2016-05-27 ENCOUNTER — Encounter: Payer: Self-pay | Admitting: Nurse Practitioner

## 2016-05-27 ENCOUNTER — Encounter: Payer: Self-pay | Admitting: Internal Medicine

## 2016-05-27 DIAGNOSIS — F0280 Dementia in other diseases classified elsewhere without behavioral disturbance: Secondary | ICD-10-CM

## 2016-05-27 DIAGNOSIS — K219 Gastro-esophageal reflux disease without esophagitis: Secondary | ICD-10-CM | POA: Diagnosis not present

## 2016-05-27 DIAGNOSIS — K5900 Constipation, unspecified: Secondary | ICD-10-CM

## 2016-05-27 DIAGNOSIS — G308 Other Alzheimer's disease: Secondary | ICD-10-CM | POA: Diagnosis not present

## 2016-05-27 DIAGNOSIS — F5101 Primary insomnia: Secondary | ICD-10-CM

## 2016-05-27 DIAGNOSIS — G8929 Other chronic pain: Secondary | ICD-10-CM | POA: Diagnosis not present

## 2016-05-27 DIAGNOSIS — E039 Hypothyroidism, unspecified: Secondary | ICD-10-CM

## 2016-05-27 DIAGNOSIS — I10 Essential (primary) hypertension: Secondary | ICD-10-CM

## 2016-05-27 DIAGNOSIS — M5440 Lumbago with sciatica, unspecified side: Secondary | ICD-10-CM

## 2016-05-27 NOTE — Assessment & Plan Note (Signed)
Resides in AL for care needs, continue Namenda and Aricept for memory preservation.

## 2016-05-27 NOTE — Assessment & Plan Note (Signed)
Stable, continue diet control, Colace daily.

## 2016-05-27 NOTE — Progress Notes (Signed)
Location:  [facility] Room Number: 17 Place of Service:  ALF [phone]) Provider:  Mast, Manxie  NP  Estill Dooms, MD  Patient Care Team: Estill Dooms, MD as PCP - General (Internal Medicine) McKenzie, Lilia Argue, MD (Inactive) (Anesthesiology) Estill Dooms, MD as Consulting Physician (Internal Medicine) Mast, Man X, NP as Nurse Practitioner (Nurse Practitioner) Kristeen Miss, MD as Consulting Physician (Neurosurgery) Shon Hough, MD as Consulting Physician (Ophthalmology) [name], [facility] (Skilled Nursing and [place]) [name], MD as Consulting Physician (Dermatology)  Extended Emergency Contact Information Primary Emergency Contact: [name] Address: [place], [place] Phone: [phone] Mobile Phone: [phone] Relation: Son  Code Status:  DNR Goals of care: Advanced Directive information Advanced Directives 05/27/2016  Does Patient Have a Medical Advance Directive? Yes  Type of Paramedic of [place];Living will;Out of facility DNR (pink MOST or yellow form)  Does patient want to make changes to medical advance directive? No - Patient declined  Copy of [place] in Chart? Yes  Would patient like information on creating a medical advance directive? -  Pre-existing out of facility DNR order (yellow form or pink MOST form) Yellow form placed in chart (order not valid for inpatient use)     Chief Complaint  Patient presents with  . Medical Management of Chronic Issues    HPI:  Pt is a 83 y.o. female seen today for medical management of chronic diseases.      Hx of HTN, controlled, taking Lisinopril/HCT 10/12.5, Amlodipine 5mg . Memory is preserved on Namenda and Aricpet. Taking Levothyroxine 37.20mcg, last TSH 4.08 03/03/16  Past Medical History:  Diagnosis Date  . Allergic rhinitis   . Asthma   . Carpal tunnel  syndrome 1980  . Chronic low back pain   . Constipation 06/27/2014  . GERD (gastroesophageal reflux disease)   . Hearing loss    Hearing aids  . Hyperglycemia 06/27/2014  . Hyperlipidemia 06/27/2014  . Hypertension   . Insomnia 06/27/2014  . Left leg DVT ([place])    chronic since 2008  . Memory loss 06/27/2014  . Meningioma ([place]) 2000   Dr. Ellene Route  . Osteopenia    Past Surgical History:  Procedure Laterality Date  . BACK SURGERY  1999   ruptured disk  . BRAIN TUMOR EXCISION  2000   Dr. Ellene Route  . CARPAL TUNNEL RELEASE  1980  . TEAR DUCT PROBING Bilateral 2014   Saluidon    No Known Allergies  Outpatient Encounter Prescriptions as of 05/27/2016  Medication Sig  . ALREX 0.2 % SUSP Place 1 drop into both eyes. Twice a day  . amLODipine-atorvastatin (CADUET) 5-10 MG tablet TAKE ONE TABLET DAILY FOR BLOOD PRESSURE  . docusate sodium (COLACE) 100 MG capsule Take 100 mg by mouth daily.   Marland Kitchen donepezil (ARICEPT) 10 MG tablet Take 10 mg by mouth at bedtime.  . fluticasone (FLONASE) 50 MCG/ACT nasal spray Place 2 sprays into both nostrils daily.  Marland Kitchen levothyroxine (SYNTHROID, LEVOTHROID) 25 MCG tablet Take by mouth. Take 1.5 tablets by mouth daily.  Marland Kitchen lisinopril-hydrochlorothiazide (PRINZIDE,ZESTORETIC) 10-12.5 MG tablet TAKE 1 TABLET ONCE A DAY.  . memantine (NAMENDA) 10 MG tablet Take 10 mg by mouth 2 (two) times daily.  . Multiple Vitamins-Minerals (ICAPS) CAPS Take by mouth. Take one tablet twice daily vitamin for eyes  . olopatadine (PATANOL) 0.1 % ophthalmic  solution Place 1 drop into both eyes as needed for allergies.   No facility-administered encounter medications on file as of 05/27/2016.     Review of Systems  Constitutional: Negative for activity change, appetite change, chills, diaphoresis, fatigue, fever and unexpected weight change.  HENT: Positive for hearing loss (Bilateral hearing aids). Negative for congestion, ear discharge, ear pain, postnasal drip, rhinorrhea, sore  throat, tinnitus, trouble swallowing and voice change.        History of allergic rhinitis and conjunctivitis  Eyes: Negative for pain, redness, itching and visual disturbance.       History of dry eyes  Respiratory: Negative for cough, choking, shortness of breath and wheezing.   Cardiovascular: Positive for leg swelling (History left DVT. Wears compression stockings bilaterally daily.). Negative for chest pain and palpitations.  Gastrointestinal: Positive for constipation. Negative for abdominal distention, abdominal pain, diarrhea and nausea.  Endocrine: Negative for cold intolerance, heat intolerance, polydipsia, polyphagia and polyuria.       History of hyperglycemia. Previously on metformin, but this was discontinued at some time over the last year because blood sugars began running normal. TSH elevated may 2017. Started levothyroxine.  Genitourinary: Negative for difficulty urinating, dysuria, flank pain, frequency, hematuria, pelvic pain, urgency and vaginal discharge.  Musculoskeletal: Positive for back pain and gait problem (Unstable. Uses cane.). Negative for arthralgias, myalgias, neck pain and neck stiffness.  Skin: Positive for rash (lefft side of the neck and left nape of neck). Negative for color change and pallor.       Dr. Elvera Lennox remove the lesion of the base of the left neck. Nodular basal cell cancer was found on the pathology report. Sutures are still in place but are due to be removed today at his office.  Allergic/Immunologic: Negative.   Neurological: Negative for dizziness, tremors, seizures, syncope, weakness, numbness and headaches.       Meningioma partially resected in 2000. Continues to residual meningioma at the base of the brain. Denies headaches or history of seizures. History of progressive memory loss. Had been using Aricept, but this was stopped by the patient, because she could not see that it was making any difference. She has resumed it. She is tolerating  the Aricept and Namenda without side effect.  Hematological: Negative for adenopathy. Does not bruise/bleed easily.  Psychiatric/Behavioral: Positive for confusion, decreased concentration and sleep disturbance (Responding well to use of zolpidem). Negative for agitation, behavioral problems, dysphoric mood, hallucinations and suicidal ideas. The patient is not nervous/anxious and is not hyperactive.     Immunization History  Administered Date(s) Administered  . Influenza-Unspecified 10/20/2013, 10/05/2014, 10/18/2015  . PPD Test 10/10/2013  . Pneumococcal Polysaccharide-23 02/06/2009  . Td 10/11/2010  . Zoster 01/08/2012   Pertinent  Health Maintenance Due  Topic Date Due  . DEXA SCAN  10/06/1989  . PNA vac Low Risk Adult (2 of 2 - PCV13) 02/06/2010  . INFLUENZA VACCINE  08/06/2016   Fall Risk  06/12/2015 06/12/2015 03/13/2015 01/09/2015 12/05/2014  Falls in the past year? No No Yes No No  Number falls in past yr: - - 1 - -  Injury with Fall? - - No - -   Functional Status Survey:    Vitals:   05/27/16 1607  BP: (!) 109/59  Pulse: 73  Resp: 20  Temp: 99.4 F (37.4 C)  SpO2: 93%  Weight: 158 lb 11.2 oz (72 kg)  Height: 5\' 1"  (1.549 m)   Body mass index is 29.99 kg/m. Physical Exam  Constitutional:  She is oriented to person, place, and time. She appears well-developed and well-nourished. No distress.  HENT:  Right Ear: External ear normal.  Left Ear: External ear normal.  Nose: Nose normal.  Mouth/Throat: Oropharynx is clear and moist. No oropharyngeal exudate.  Loss of hearing and use of hearing aids bilaterally.  Eyes: Conjunctivae and EOM are normal. Pupils are equal, round, and reactive to light. No scleral icterus.  Neck: No JVD present. No tracheal deviation present. No thyromegaly present.  Cardiovascular: Normal rate, regular rhythm, normal heart sounds and intact distal pulses.  Exam reveals no gallop and no friction rub.   No murmur heard. Pulmonary/Chest: Effort  normal. No respiratory distress. She has no wheezes. She has no rales. She exhibits no tenderness.  Abdominal: She exhibits no distension and no mass. There is no tenderness.  Musculoskeletal: Normal range of motion. She exhibits edema. She exhibits no tenderness.  Mild gait instability. Trace edema BLE, compression hosiery   Lymphadenopathy:    She has no cervical adenopathy.  Neurological: She is alert and oriented to person, place, and time. No cranial nerve deficit. Coordination normal.  Poor short-term recall. 10/03/2014 MMSE 25/30. Passed clock drawing.  Skin: No rash noted. She is not diaphoretic. No erythema. No pallor.  Psychiatric: She has a normal mood and affect. Her behavior is normal. Judgment and thought content normal.    Labs reviewed:  Recent Labs  05/31/15 09/27/15  NA 139 139  K 4.2 4.2  BUN 35* 36*  CREATININE 1.1 1.1    Recent Labs  05/31/15 09/27/15  AST 16 16  ALT 12 16  ALKPHOS 57 45    Recent Labs  05/31/15 09/27/15  WBC 8.0 9.4  HGB 12.2 12.1  HCT 37 36  PLT 234 256   Lab Results  Component Value Date   TSH 3.57 11/01/2015   No results found for: HGBA1C Lab Results  Component Value Date   CHOL 137 09/28/2014   HDL 39 09/28/2014   LDLCALC 65 09/28/2014   TRIG 163 (A) 09/28/2014    Significant Diagnostic Results in last 30 days:  No results found.  Assessment/Plan Hypertension Blood pressure is controlled, continue Amlodipine 5mg , Lisinopril/HCT 10/12.5mg  03/03/16 wbc 8.5, Hgb 12.4, plt 273, Na 140, K 4.2, Bun 24, creat 1.16, TSH 4.08 Update CBC CMP TSH  GERD (gastroesophageal reflux disease) Stable, not on acid reducer.   Constipation Stable, continue diet control, Colace daily.  Hypothyroidism Continue Levothyroxine 37.105mcg, last TSH 4.08 03/03/16, update TSH  Alzheimer disease Resides in AL for care needs, continue Namenda and Aricept for memory preservation.   Chronic low back pain stable  Insomnia She stated sleeps  well at night.      Family/ staff Communication: AL  Labs/tests ordered:  CBC CMP TSH

## 2016-05-27 NOTE — Assessment & Plan Note (Signed)
Continue Levothyroxine 37.19mcg, last TSH 4.08 03/03/16, update TSH

## 2016-05-27 NOTE — Assessment & Plan Note (Signed)
Stable, not on acid reducer.  

## 2016-05-27 NOTE — Assessment & Plan Note (Signed)
stable °

## 2016-05-27 NOTE — Assessment & Plan Note (Signed)
Blood pressure is controlled, continue Amlodipine 5mg , Lisinopril/HCT 10/12.5mg  03/03/16 wbc 8.5, Hgb 12.4, plt 273, Na 140, K 4.2, Bun 24, creat 1.16, TSH 4.08 Update CBC CMP TSH

## 2016-05-27 NOTE — Assessment & Plan Note (Signed)
She stated sleeps well at night.

## 2016-05-29 DIAGNOSIS — D649 Anemia, unspecified: Secondary | ICD-10-CM | POA: Diagnosis not present

## 2016-05-29 DIAGNOSIS — I10 Essential (primary) hypertension: Secondary | ICD-10-CM | POA: Diagnosis not present

## 2016-05-29 DIAGNOSIS — E039 Hypothyroidism, unspecified: Secondary | ICD-10-CM | POA: Diagnosis not present

## 2016-05-29 LAB — BASIC METABOLIC PANEL
BUN: 29 mg/dL — AB (ref 4–21)
Creatinine: 1.1 mg/dL (ref 0.5–1.1)
GLUCOSE: 199 mg/dL
Potassium: 4.1 mmol/L (ref 3.4–5.3)
SODIUM: 139 mmol/L (ref 137–147)

## 2016-05-29 LAB — HEPATIC FUNCTION PANEL
ALT: 15 U/L (ref 7–35)
AST: 15 U/L (ref 13–35)
Alkaline Phosphatase: 57 U/L (ref 25–125)
Bilirubin, Total: 0.5 mg/dL

## 2016-05-29 LAB — TSH: TSH: 4.21 u[IU]/mL (ref 0.41–5.90)

## 2016-05-29 LAB — CBC AND DIFFERENTIAL
HCT: 38 % (ref 36–46)
Hemoglobin: 12.5 g/dL (ref 12.0–16.0)
Platelets: 242 10*3/uL (ref 150–399)
WBC: 7.9 10^3/mL

## 2016-06-10 ENCOUNTER — Encounter: Payer: Self-pay | Admitting: Internal Medicine

## 2016-06-10 ENCOUNTER — Non-Acute Institutional Stay: Payer: Medicare Other | Admitting: Internal Medicine

## 2016-06-10 VITALS — BP 128/54 | HR 72 | Temp 97.4°F | Ht 61.0 in | Wt 158.0 lb

## 2016-06-10 DIAGNOSIS — I10 Essential (primary) hypertension: Secondary | ICD-10-CM

## 2016-06-10 DIAGNOSIS — E039 Hypothyroidism, unspecified: Secondary | ICD-10-CM | POA: Diagnosis not present

## 2016-06-10 DIAGNOSIS — R739 Hyperglycemia, unspecified: Secondary | ICD-10-CM | POA: Diagnosis not present

## 2016-06-10 DIAGNOSIS — F0280 Dementia in other diseases classified elsewhere without behavioral disturbance: Secondary | ICD-10-CM

## 2016-06-10 DIAGNOSIS — E785 Hyperlipidemia, unspecified: Secondary | ICD-10-CM | POA: Diagnosis not present

## 2016-06-10 DIAGNOSIS — G308 Other Alzheimer's disease: Secondary | ICD-10-CM | POA: Diagnosis not present

## 2016-06-10 MED ORDER — METFORMIN HCL 500 MG PO TABS: ORAL_TABLET | ORAL | 3 refills | Status: DC

## 2016-06-10 NOTE — Progress Notes (Signed)
[place] Room Number: AL 17  Place of Service: Clinic (12)     No Known Allergies  Chief Complaint  Patient presents with  . Medical Management of Chronic Issues    4 month medication management blood pressure, thyroid, Alzheimer's    HPI:  Alzheimer's disease of other onset without behavioral disturbance - stable loss of memory. Currently on memantine and donepezil.  Hyperglycemia - Continues to run high glucose  Hyperlipidemia, unspecified hyperlipidemia type - stable  Essential hypertension - controlled  Hypothyroidism, unspecified type - compensated    Medications: Patient's Medications  New Prescriptions   No medications on file  Previous Medications   ALREX 0.2 % SUSP    Place 1 drop into both eyes. Twice a day   AMLODIPINE-ATORVASTATIN (CADUET) 5-10 MG TABLET    TAKE ONE TABLET DAILY FOR BLOOD PRESSURE   DOCUSATE SODIUM (COLACE) 100 MG CAPSULE    Take 100 mg by mouth daily.    DONEPEZIL (ARICEPT) 10 MG TABLET    Take 10 mg by mouth at bedtime.   FLUTICASONE (FLONASE) 50 MCG/ACT NASAL SPRAY    Place 2 sprays into both nostrils daily.   LEVOTHYROXINE (SYNTHROID, LEVOTHROID) 25 MCG TABLET    Take by mouth. Take 1.5 tablets by mouth daily.   LISINOPRIL-HYDROCHLOROTHIAZIDE (PRINZIDE,ZESTORETIC) 10-12.5 MG TABLET    TAKE 1 TABLET ONCE A DAY.   MEMANTINE (NAMENDA) 10 MG TABLET    Take 10 mg by mouth 2 (two) times daily.   MULTIPLE VITAMINS-MINERALS (ICAPS) CAPS    Take by mouth. Take one tablet twice daily vitamin for eyes   OLOPATADINE (PATANOL) 0.1 % OPHTHALMIC SOLUTION    Place 1 drop into both eyes as needed for allergies.  Modified Medications   No medications on file  Discontinued Medications   No medications on file     Review of Systems  Constitutional: Negative for activity change, appetite change, chills, diaphoresis, fatigue, fever and unexpected weight change.  HENT: Positive for hearing loss (Bilateral hearing aids). Negative for  congestion, ear discharge, ear pain, postnasal drip, rhinorrhea, sore throat, tinnitus, trouble swallowing and voice change.        History of allergic rhinitis and conjunctivitis  Eyes: Negative for pain, redness, itching and visual disturbance.       History of dry eyes  Respiratory: Negative for cough, choking, shortness of breath and wheezing.   Cardiovascular: Negative for chest pain, palpitations and leg swelling (History left DVT. Wears compression stockings bilaterally daily.).  Gastrointestinal: Positive for constipation. Negative for abdominal distention, abdominal pain, diarrhea and nausea.  Endocrine: Negative for cold intolerance, heat intolerance, polydipsia, polyphagia and polyuria.       History of hyperglycemia. Previously on metformin, but this was discontinued at some time over the last year because blood sugars began running normal. TSH elevated may 2017. Started levothyroxine.  Genitourinary: Negative for difficulty urinating, dysuria, flank pain, frequency, hematuria, pelvic pain, urgency and vaginal discharge.  Musculoskeletal: Positive for back pain and gait problem (Unstable. Uses cane.). Negative for arthralgias, myalgias, neck pain and neck stiffness.  Skin: Positive for rash (lefft side of the neck and left nape of neck). Negative for color change and pallor.       Dr. Elvera Lennox remove the lesion of the base of the left neck. Nodular basal cell cancer was found on the pathology report. Sutures are still in place but are due to be removed today at his office.  Allergic/Immunologic: Negative.  Neurological: Negative for dizziness, tremors, seizures, syncope, weakness, numbness and headaches.       Meningioma partially resected in 2000. Continues to residual meningioma at the base of the brain. Denies headaches or history of seizures. History of progressive memory loss. Had been using Aricept, but this was stopped by the patient, because she could not see that it was making  any difference. She has resumed it. She is tolerating the Aricept and Namenda without side effect.  Hematological: Negative for adenopathy. Does not bruise/bleed easily.  Psychiatric/Behavioral: Positive for confusion, decreased concentration and sleep disturbance (Responding well to use of zolpidem). Negative for agitation, behavioral problems, dysphoric mood, hallucinations and suicidal ideas. The patient is not nervous/anxious and is not hyperactive.     Vitals:   06/10/16 0925  BP: (!) 128/54  Pulse: 72  Temp: 97.4 F (36.3 C)  TempSrc: Oral  SpO2: 97%  Weight: 158 lb (71.7 kg)  Height: 5' 1"  (1.549 m)   Wt Readings from Last 3 Encounters:  06/10/16 158 lb (71.7 kg)  05/27/16 158 lb 11.2 oz (72 kg)  04/29/16 158 lb (71.7 kg)    Body mass index is 29.85 kg/m.  Physical Exam  Constitutional: She is oriented to person, place, and time. She appears well-developed and well-nourished. No distress.  HENT:  Right Ear: External ear normal.  Left Ear: External ear normal.  Nose: Nose normal.  Mouth/Throat: Oropharynx is clear and moist. No oropharyngeal exudate.  Loss of hearing and use of hearing aids bilaterally.  Eyes: Conjunctivae and EOM are normal. Pupils are equal, round, and reactive to light. No scleral icterus.  Neck: No JVD present. No tracheal deviation present. No thyromegaly present.  Cardiovascular: Normal rate, regular rhythm, normal heart sounds and intact distal pulses.  Exam reveals no gallop and no friction rub.   No murmur heard. Pulmonary/Chest: Effort normal. No respiratory distress. She has no wheezes. She has no rales. She exhibits no tenderness.  Abdominal: She exhibits no distension and no mass. There is no tenderness.  Musculoskeletal: Normal range of motion. She exhibits no edema or tenderness.  Mild gait instability. Controlled edema BLE, compression hosiery   Lymphadenopathy:    She has no cervical adenopathy.  Neurological: She is alert and  oriented to person, place, and time. No cranial nerve deficit. Coordination normal.  Poor short-term recall. 10/03/2014 MMSE 25/30. Passed clock drawing.  Skin: No rash noted. She is not diaphoretic. No erythema. No pallor.  Psychiatric: She has a normal mood and affect. Her behavior is normal. Judgment and thought content normal.     Labs reviewed: Lab Summary Latest Ref Rng & Units 05/29/2016 09/27/2015 05/31/2015  Hemoglobin 12.0 - 16.0 g/dL 12.5 12.1 12.2  Hematocrit 36 - 46 % 38 36 37  White count 10:3/mL 7.9 9.4 8.0  Platelet count 150 - 399 K/L 242 256 234  Sodium 137 - 147 mmol/L 139 139 139  Potassium 3.4 - 5.3 mmol/L 4.1 4.2 4.2  Calcium - (None) (None) (None)  Phosphorus - (None) (None) (None)  Creatinine 0.5 - 1.1 mg/dL 1.1 1.1 1.1  AST 13 - 35 U/L 15 16 16   Alk Phos 25 - 125 U/L 57 45 57  Bilirubin - (None) (None) (None)  Glucose mg/dL 199 223 125  Cholesterol - (None) (None) (None)  HDL cholesterol - (None) (None) (None)  Triglycerides - (None) (None) (None)  LDL Direct - (None) (None) (None)  LDL Calc - (None) (None) (None)  Total protein - (None) (None) (  None)  Albumin - (None) (None) (None)  Some recent data might be hidden   Lab Results  Component Value Date   TSH 4.21 05/29/2016   Lab Results  Component Value Date   BUN 29 (A) 05/29/2016   BUN 36 (A) 09/27/2015   BUN 35 (A) 05/31/2015   Lab Results  Component Value Date   CREATININE 1.1 05/29/2016   CREATININE 1.1 09/27/2015   CREATININE 1.1 05/31/2015   No results found for: HGBA1C     Assessment/Plan  1. Alzheimer's disease of other onset without behavioral disturbance The current medical regimen is effective;  continue present plan and medications.  2. Hyperglycemia - metFORMIN (GLUCOPHAGE) 500 MG tablet; One before breakfast to control glucose.  Dispense: 90 tablet; Refill: 3 - Hemoglobin A1c; Future - Basic metabolic panel; Future  3. Hyperlipidemia, unspecified hyperlipidemia  type Continue to monitor   4. Essential hypertension The current medical regimen is effective;  continue present plan and medications.  5. Hypothyroidism, unspecified type compensated

## 2016-06-17 ENCOUNTER — Encounter: Payer: Self-pay | Admitting: Internal Medicine

## 2016-08-27 ENCOUNTER — Non-Acute Institutional Stay: Payer: Medicare Other

## 2016-08-27 DIAGNOSIS — Z0000 Encounter for general adult medical examination without abnormal findings: Secondary | ICD-10-CM | POA: Diagnosis not present

## 2016-08-27 NOTE — Progress Notes (Signed)
Subjective:   Kayla Weaver is a 83 y.o. female who presents for Medicare Annual (Subsequent) preventive examination at [facility]-03/21/14    Objective:     Vitals: BP (!) 130/58 (BP Location: Right Arm, Patient Position: Sitting)   Pulse 72   Temp (!) 97.4 F (36.3 C) (Oral)   Ht 5\' 1"  (1.549 m)   Wt 158 lb (71.7 kg)   SpO2 97%   BMI 29.85 kg/m   Body mass index is 29.85 kg/m.   Tobacco History  Smoking Status  . Never Smoker  Smokeless Tobacco  . Never Used     Counseling given: Not Answered   Past Medical History:  Diagnosis Date  . Allergic rhinitis   . Asthma   . Carpal tunnel syndrome 1980  . Chronic low back pain   . Constipation 06/27/2014  . GERD (gastroesophageal reflux disease)   . Hearing loss    Hearing aids  . Hyperglycemia 06/27/2014  . Hyperlipidemia 06/27/2014  . Hypertension   . Insomnia 06/27/2014  . Left leg DVT ([place])    chronic since 2008  . Memory loss 06/27/2014  . Meningioma ([place]) 2000   Dr. Ellene Route  . Osteopenia    Past Surgical History:  Procedure Laterality Date  . BACK SURGERY  1999   ruptured disk  . BRAIN TUMOR EXCISION  2000   Dr. Ellene Route  . CARPAL TUNNEL RELEASE  1980  . TEAR DUCT PROBING Bilateral 2014   [facility]   Family History  Problem Relation Age of Onset  . CVA Mother   . Cancer Father        prostate    History  Sexual Activity  . Sexual activity: No    Outpatient Encounter Prescriptions as of 08/27/2016  Medication Sig  . amLODipine-atorvastatin (CADUET) 5-10 MG tablet TAKE ONE TABLET DAILY FOR BLOOD PRESSURE  . docusate sodium (COLACE) 100 MG capsule Take 100 mg by mouth daily.   Marland Kitchen donepezil (ARICEPT) 10 MG tablet Take 10 mg by mouth at bedtime.  . fluticasone (FLONASE) 50 MCG/ACT nasal spray Place 2 sprays into both nostrils daily.  Marland Kitchen levothyroxine (SYNTHROID, LEVOTHROID) 25 MCG tablet Take by mouth. Take 1.5 tablets by mouth daily.  Marland Kitchen  lisinopril-hydrochlorothiazide (PRINZIDE,ZESTORETIC) 10-12.5 MG tablet TAKE 1 TABLET ONCE A DAY.  . memantine (NAMENDA) 10 MG tablet Take 10 mg by mouth 2 (two) times daily.  . metFORMIN (GLUCOPHAGE) 500 MG tablet One before breakfast to control glucose.  . Multiple Vitamins-Minerals (ICAPS) CAPS Take by mouth. Take one tablet twice daily vitamin for eyes  . [DISCONTINUED] ALREX 0.2 % SUSP Place 1 drop into both eyes. Twice a day  . [DISCONTINUED] olopatadine (PATANOL) 0.1 % ophthalmic solution Place 1 drop into both eyes as needed for allergies.   No facility-administered encounter medications on file as of 08/27/2016.     Activities of Daily Living In your present state of health, do you have any difficulty performing the following activities: 08/27/2016  Hearing? Y  Vision? Y  Difficulty concentrating or making decisions? Y  Walking or climbing stairs? Y  Dressing or bathing? Y  Doing errands, shopping? Y  Preparing Food and eating ? Y  Using the Toilet? Y  In the past six months, have you accidently leaked urine? N  Do you have problems with loss of bowel control? N  Managing your Medications? Y  Managing your Finances? Y  Housekeeping or managing your Housekeeping? Y  Some  recent data might be hidden    Patient Care Team: Blanchie Serve, MD as PCP - General (Internal Medicine) McKenzie, Lilia Argue, MD (Inactive) (Anesthesiology) Estill Dooms, MD as Consulting Physician (Internal Medicine) Mast, Man X, NP as Nurse Practitioner (Nurse Practitioner) Kristeen Miss, MD as Consulting Physician (Neurosurgery) Shon Hough, MD as Consulting Physician (Ophthalmology) [name], [facility] (Skilled Nursing and [place]) [name], MD as Consulting Physician (Dermatology)    Assessment:     Exercise Activities and Dietary recommendations Current Exercise Habits: Structured exercise class, Type of exercise: Other - see comments (nursing home exercise  class), Time (Minutes): 30, Frequency (Times/Week): 7, Weekly Exercise (Minutes/Week): 210, Intensity: Mild, Exercise limited by: neurologic condition(s)  Goals    None     Fall Risk Fall Risk  08/27/2016 06/12/2015 06/12/2015 03/13/2015 01/09/2015  Falls in the past year? No No No Yes No  Number falls in past yr: - - - 1 -  Injury with Fall? - - - No -   Depression Screen PHQ 2/9 Scores 08/27/2016 06/27/2014  PHQ - 2 Score 0 0     Cognitive Function MMSE - Mini Mental State Exam 08/27/2016 10/03/2014  Orientation to time 2 1  Orientation to Place 4 5  Registration 3 3  Attention/ Calculation 4 5  Recall 0 2  Language- name 2 objects 2 2  Language- repeat 1 1  Language- follow 3 step command 3 3  Language- read & follow direction 1 1  Write a sentence 1 1  Copy design 0 1  Total score 21 25        Immunization History  Administered Date(s) Administered  . Influenza-Unspecified 10/20/2013, 10/05/2014, 10/18/2015  . PPD Test 10/10/2013  . Pneumococcal Polysaccharide-23 02/06/2009  . Td 10/11/2010  . Zoster 01/08/2012   Screening Tests Health Maintenance  Topic Date Due  . DEXA SCAN  10/06/1989  . PNA vac Low Risk Adult (2 of 2 - PCV13) 02/06/2010  . INFLUENZA VACCINE  08/06/2016  . TETANUS/TDAP  10/10/2020      Plan:    I have personally reviewed and addressed the Medicare Annual Wellness questionnaire and have noted the following in the patient's chart:  A. Medical and social history B. Use of alcohol, tobacco or illicit drugs  C. Current medications and supplements D. Functional ability and status E.  Nutritional status F.  Physical activity G. Advance directives H. List of other physicians I.  Hospitalizations, surgeries, and ER visits in previous 12 months J.  [place] to include hearing, vision, cognitive, depression L. Referrals and appointments - none  In addition, I have reviewed and discussed with patient certain preventive protocols, quality  metrics, and best practice recommendations. A written personalized care plan for preventive services as well as general preventive health recommendations were provided to patient.  See attached scanned questionnaire for additional information.   Signed,   Rich Reining, RN Nurse Health Advisor   Quick Notes   Health Maintenance: Up to date     Abnormal Screen: MMSE 21/30 Did not pass clock     Patient Concerns: None     Nurse Concerns: None

## 2016-08-27 NOTE — Patient Instructions (Signed)
Kayla Weaver , Thank you for taking time to come for your Medicare Wellness Visit. I appreciate your ongoing commitment to your health goals. Please review the following plan we discussed and let me know if I can assist you in the future.   Screening recommendations/referrals: Colonoscopy excluded, pt over age 81 Mammogram excluded, pt over age 27 Bone Density up to date Recommended yearly ophthalmology/optometry visit for glaucoma screening and checkup Recommended yearly dental visit for hygiene and checkup  Vaccinations: Influenza vaccine due 2018 fall season Pneumococcal vaccine up to date Tdap vaccine up to date. Due 10/10/20 Shingles vaccine not in records  Advanced directives: In Chart  Conditions/risks identified: None  Next appointment: Dr. Bubba Camp makes rounds   Preventive Care 2 Years and Older, Female Preventive care refers to lifestyle choices and visits with your health care provider that can promote health and wellness. What does preventive care include?  A yearly physical exam. This is also called an annual well check.  Dental exams once or twice a year.  Routine eye exams. Ask your health care provider how often you should have your eyes checked.  Personal lifestyle choices, including:  Daily care of your teeth and gums.  Regular physical activity.  Eating a healthy diet.  Avoiding tobacco and drug use.  Limiting alcohol use.  Practicing safe sex.  Taking low-dose aspirin every day.  Taking vitamin and mineral supplements as recommended by your health care provider. What happens during an annual well check? The services and screenings done by your health care provider during your annual well check will depend on your age, overall health, lifestyle risk factors, and family history of disease. Counseling  Your health care provider may ask you questions about your:  Alcohol use.  Tobacco use.  Drug use.  Emotional well-being.  Home and  relationship well-being.  Sexual activity.  Eating habits.  History of falls.  Memory and ability to understand (cognition).  Work and work Statistician.  Reproductive health. Screening  You may have the following tests or measurements:  Height, weight, and BMI.  Blood pressure.  Lipid and cholesterol levels. These may be checked every 5 years, or more frequently if you are over 3 years old.  Skin check.  Lung cancer screening. You may have this screening every year starting at age 46 if you have a 30-pack-year history of smoking and currently smoke or have quit within the past 15 years.  Fecal occult blood test (FOBT) of the stool. You may have this test every year starting at age 67.  Flexible sigmoidoscopy or colonoscopy. You may have a sigmoidoscopy every 5 years or a colonoscopy every 10 years starting at age 27.  Hepatitis C blood test.  Hepatitis B blood test.  Sexually transmitted disease (STD) testing.  Diabetes screening. This is done by checking your blood sugar (glucose) after you have not eaten for a while (fasting). You may have this done every 1-3 years.  Bone density scan. This is done to screen for osteoporosis. You may have this done starting at age 13.  Mammogram. This may be done every 1-2 years. Talk to your health care provider about how often you should have regular mammograms. Talk with your health care provider about your test results, treatment options, and if necessary, the need for more tests. Vaccines  Your health care provider may recommend certain vaccines, such as:  Influenza vaccine. This is recommended every year.  Tetanus, diphtheria, and acellular pertussis (Tdap, Td) vaccine. You may need  a Td booster every 10 years.  Zoster vaccine. You may need this after age 28.  Pneumococcal 13-valent conjugate (PCV13) vaccine. One dose is recommended after age 56.  Pneumococcal polysaccharide (PPSV23) vaccine. One dose is recommended after  age 73. Talk to your health care provider about which screenings and vaccines you need and how often you need them. This information is not intended to replace advice given to you by your health care provider. Make sure you discuss any questions you have with your health care provider. Document Released: 01/19/2015 Document Revised: 09/12/2015 Document Reviewed: 10/24/2014 Elsevier Interactive Patient Education  2017 [employer] Prevention in the Home Falls can cause injuries. They can happen to people of all ages. There are many things you can do to make your home safe and to help prevent falls. What can I do on the outside of my home?  Regularly fix the edges of walkways and driveways and fix any cracks.  Remove anything that might make you trip as you walk through a door, such as a raised step or threshold.  Trim any bushes or trees on the path to your home.  Use bright outdoor lighting.  Clear any walking paths of anything that might make someone trip, such as rocks or tools.  Regularly check to see if handrails are loose or broken. Make sure that both sides of any steps have handrails.  Any raised decks and porches should have guardrails on the edges.  Have any leaves, snow, or ice cleared regularly.  Use sand or salt on walking paths during winter.  Clean up any spills in your garage right away. This includes oil or grease spills. What can I do in the bathroom?  Use night lights.  Install grab bars by the toilet and in the tub and shower. Do not use towel bars as grab bars.  Use non-skid mats or decals in the tub or shower.  If you need to sit down in the shower, use a plastic, non-slip stool.  Keep the floor dry. Clean up any water that spills on the floor as soon as it happens.  Remove soap buildup in the tub or shower regularly.  Attach bath mats securely with double-sided non-slip rug tape.  Do not have throw rugs and other things on the floor that can  make you trip. What can I do in the bedroom?  Use night lights.  Make sure that you have a light by your bed that is easy to reach.  Do not use any sheets or blankets that are too big for your bed. They should not hang down onto the floor.  Have a firm chair that has side arms. You can use this for support while you get dressed.  Do not have throw rugs and other things on the floor that can make you trip. What can I do in the kitchen?  Clean up any spills right away.  Avoid walking on wet floors.  Keep items that you use a lot in easy-to-reach places.  If you need to reach something above you, use a strong step stool that has a grab bar.  Keep electrical cords out of the way.  Do not use floor polish or wax that makes floors slippery. If you must use wax, use non-skid floor wax.  Do not have throw rugs and other things on the floor that can make you trip. What can I do with my stairs?  Do not leave any items on the  stairs.  Make sure that there are handrails on both sides of the stairs and use them. Fix handrails that are broken or loose. Make sure that handrails are as long as the stairways.  Check any carpeting to make sure that it is firmly attached to the stairs. Fix any carpet that is loose or worn.  Avoid having throw rugs at the top or bottom of the stairs. If you do have throw rugs, attach them to the floor with carpet tape.  Make sure that you have a light switch at the top of the stairs and the bottom of the stairs. If you do not have them, ask someone to add them for you. What else can I do to help prevent falls?  Wear shoes that:  Do not have high heels.  Have rubber bottoms.  Are comfortable and fit you well.  Are closed at the toe. Do not wear sandals.  If you use a stepladder:  Make sure that it is fully opened. Do not climb a closed stepladder.  Make sure that both sides of the stepladder are locked into place.  Ask someone to hold it for you,  if possible.  Clearly mark and make sure that you can see:  Any grab bars or handrails.  First and last steps.  Where the edge of each step is.  Use tools that help you move around (mobility aids) if they are needed. These include:  Canes.  Walkers.  Scooters.  Crutches.  Turn on the lights when you go into a dark area. Replace any light bulbs as soon as they burn out.  Set up your furniture so you have a clear path. Avoid moving your furniture around.  If any of your floors are uneven, fix them.  If there are any pets around you, be aware of where they are.  Review your medicines with your doctor. Some medicines can make you feel dizzy. This can increase your chance of falling. Ask your doctor what other things that you can do to help prevent falls. This information is not intended to replace advice given to you by your health care provider. Make sure you discuss any questions you have with your health care provider. Document Released: 10/19/2008 Document Revised: 05/31/2015 Document Reviewed: 01/27/2014 Elsevier Interactive Patient Education  2017 [employer].

## 2016-09-04 ENCOUNTER — Encounter: Payer: Self-pay | Admitting: Family

## 2016-09-04 ENCOUNTER — Non-Acute Institutional Stay: Payer: Medicare Other | Admitting: Family

## 2016-09-04 DIAGNOSIS — H6121 Impacted cerumen, right ear: Secondary | ICD-10-CM | POA: Diagnosis not present

## 2016-09-04 DIAGNOSIS — I10 Essential (primary) hypertension: Secondary | ICD-10-CM | POA: Diagnosis not present

## 2016-09-04 DIAGNOSIS — R739 Hyperglycemia, unspecified: Secondary | ICD-10-CM | POA: Diagnosis not present

## 2016-09-04 LAB — BASIC METABOLIC PANEL
BUN: 29 — AB (ref 4–21)
Creatinine: 1.3 — AB (ref 0.5–1.1)
Glucose: 177
Potassium: 4.2 (ref 3.4–5.3)
Sodium: 139 (ref 137–147)

## 2016-09-04 LAB — HEMOGLOBIN A1C: HEMOGLOBIN A1C: 7

## 2016-09-04 MED ORDER — CARBAMIDE PEROXIDE 6.5 % OT SOLN: 5.0000 [drp] | Freq: Two times a day (BID) | OTIC | 0 refills | Status: AC

## 2016-09-04 NOTE — Progress Notes (Signed)
Location:  [place] Room Number: 17 Place of Service:  ALF [phone]) Provider: Bernisha Verma FNP-C  Blanchie Serve, MD  Patient Care Team: Blanchie Serve, MD as PCP - General (Internal Medicine) McKenzie, Lilia Argue, MD (Inactive) (Anesthesiology) Estill Dooms, MD as Consulting Physician (Internal Medicine) Mast, Man X, NP as Nurse Practitioner (Nurse Practitioner) Kristeen Miss, MD as Consulting Physician (Neurosurgery) Shon Hough, MD as Consulting Physician (Ophthalmology) [name], [facility] (Skilled Nursing and [place]) [name], MD as Consulting Physician (Dermatology)  Extended Emergency Contact Information Primary Emergency Contact: [name] Address: [place], [place] of [place] Phone: [phone] Mobile Phone: [phone] Relation: Son  Code Status:  DNR Goals of care: Advanced Directive information Advanced Directives 09/04/2016  Does Patient Have a Medical Advance Directive? Yes  Type of Advance Directive Out of facility DNR (pink MOST or yellow form);Living will  Does patient want to make changes to medical advance directive? -  Copy of [place] in Chart? Yes  Would patient like information on creating a medical advance directive? -  Pre-existing out of facility DNR order (yellow form or pink MOST form) Yellow form placed in chart (order not valid for inpatient use)     Chief Complaint  Patient presents with  . Acute Visit    check right ear    HPI:  Pt is a 83 y.o. female seen today at [facility] for an acute visit for evaluation of right ear wax. She is seen in her room today. She states having trouble hearing on right ear. She denies any fever, chills, sore throat or cough.     Past Medical History:  Diagnosis Date  . Allergic rhinitis   . Asthma   . Carpal tunnel syndrome 1980  . Chronic low back pain   . Constipation  06/27/2014  . GERD (gastroesophageal reflux disease)   . Hearing loss    Hearing aids  . Hyperglycemia 06/27/2014  . Hyperlipidemia 06/27/2014  . Hypertension   . Insomnia 06/27/2014  . Left leg DVT ([place])    chronic since 2008  . Memory loss 06/27/2014  . Meningioma ([place]) 2000   Dr. Ellene Route  . Osteopenia    Past Surgical History:  Procedure Laterality Date  . BACK SURGERY  1999   ruptured disk  . BRAIN TUMOR EXCISION  2000   Dr. Ellene Route  . CARPAL TUNNEL RELEASE  1980  . TEAR DUCT PROBING Bilateral 2014   Saluidon    No Known Allergies  Outpatient Encounter Prescriptions as of 09/04/2016  Medication Sig  . amLODipine-atorvastatin (CADUET) 5-10 MG tablet TAKE ONE TABLET DAILY FOR BLOOD PRESSURE  . docusate sodium (COLACE) 100 MG capsule Take 100 mg by mouth daily.   Marland Kitchen donepezil (ARICEPT) 10 MG tablet Take 10 mg by mouth at bedtime.  . fluticasone (FLONASE) 50 MCG/ACT nasal spray Place 2 sprays into both nostrils daily.  Marland Kitchen levothyroxine (SYNTHROID, LEVOTHROID) 25 MCG tablet Take by mouth. Take 1.5 tablets by mouth daily.  Marland Kitchen lisinopril-hydrochlorothiazide (PRINZIDE,ZESTORETIC) 10-12.5 MG tablet TAKE 1 TABLET ONCE A DAY.  . memantine (NAMENDA) 10 MG tablet Take 10 mg by mouth 2 (two) times daily.  . metFORMIN (GLUCOPHAGE) 500 MG tablet One before breakfast to control glucose.  . Multiple Vitamins-Minerals (ICAPS) CAPS Take by mouth. Take one tablet twice daily vitamin for eyes  . carbamide peroxide (DEBROX) 6.5 % OTIC solution  Place 5 drops into the right ear 2 (two) times daily. Then facility Nurse to lavage with warm water.   No facility-administered encounter medications on file as of 09/04/2016.     Review of Systems  Constitutional: Negative for activity change, chills, fatigue and fever.  HENT: Positive for hearing loss. Negative for congestion, dental problem, postnasal drip, rhinorrhea, sinus pain, sinus pressure, sneezing and sore throat.   Eyes: Negative for pain,  discharge, redness and itching.  Respiratory: Negative for cough, chest tightness, shortness of breath and wheezing.   Gastrointestinal: Negative for abdominal distention, abdominal pain, constipation, diarrhea, nausea and vomiting.  Skin: Negative for color change, pallor and rash.  Neurological: Negative for dizziness, light-headedness and headaches.  Psychiatric/Behavioral: Negative for agitation and confusion. The patient is not nervous/anxious.     Immunization History  Administered Date(s) Administered  . Influenza-Unspecified 10/20/2013, 10/05/2014, 10/18/2015  . PPD Test 10/10/2013, 03/23/2015, 04/10/2015  . Pneumococcal Polysaccharide-23 02/06/2009  . Td 10/11/2010  . Zoster 01/08/2012   Pertinent  Health Maintenance Due  Topic Date Due  . INFLUENZA VACCINE  08/06/2016  . DEXA SCAN  Completed  . PNA vac Low Risk Adult  Completed   Fall Risk  08/27/2016 06/12/2015 06/12/2015 03/13/2015 01/09/2015  Falls in the past year? No No No Yes No  Number falls in past yr: - - - 1 -  Injury with Fall? - - - No -    Vitals:   09/04/16 1107  BP: (!) 162/69  Pulse: 77  Resp: 18  Temp: 98.1 F (36.7 C)  SpO2: 96%  Weight: 155 lb 6.4 oz (70.5 kg)  Height: 5\' 1"  (1.549 m)   Body mass index is 29.36 kg/m. Physical Exam  Constitutional: She appears well-developed and well-nourished. No distress.  HENT:  Head: Normocephalic.  Left Ear: External ear normal.  Mouth/Throat: Oropharynx is clear and moist. No oropharyngeal exudate.  HOH Right ear canal dry cerumen impaction noted.    Eyes: Pupils are equal, round, and reactive to light. Conjunctivae and EOM are normal. Right eye exhibits no discharge. Left eye exhibits no discharge. No scleral icterus.  Neck: Normal range of motion. No JVD present. No thyromegaly present.  Cardiovascular: Normal rate, regular rhythm, normal heart sounds and intact distal pulses.  Exam reveals no gallop and no friction rub.   No murmur  heard. Pulmonary/Chest: Effort normal and breath sounds normal. No respiratory distress. She has no wheezes. She has no rales.  Abdominal: Soft. Bowel sounds are normal. She exhibits no distension. There is no tenderness. There is no rebound.  Lymphadenopathy:    She has no cervical adenopathy.  Neurological:  Alert and oriented x 2 disoriented to time.   Skin: Skin is warm and dry. No rash noted. No erythema. No pallor.  Psychiatric: She has a normal mood and affect.    Labs reviewed:  Recent Labs  09/27/15 03/03/16 05/29/16  NA 139 140 139  K 4.2 4.2 4.1  BUN 36* 24* 29*  CREATININE 1.1 1.2* 1.1    Recent Labs  09/27/15 03/03/16 05/29/16  AST 16 19 15   ALT 16 20 15   ALKPHOS 45 50 57    Recent Labs  09/27/15 03/03/16 05/29/16  WBC 9.4 8.5 7.9  HGB 12.1 12.4 12.5  HCT 36 38 38  PLT 256 273 242   Lab Results  Component Value Date   TSH 4.21 05/29/2016   No results found for: HGBA1C Lab Results  Component Value Date   CHOL  134 03/03/2016   HDL 39 03/03/2016   LDLCALC 62 03/03/2016   TRIG 163 (A) 03/03/2016    Significant Diagnostic Results in last 30 days:  No results found.  Assessment/Plan   Impacted cerumen of right ear Afebrile.Right ear TM not visualized due to dry cerumen impaction. Place Debrox 6.5 % Otic soln 5 drops into right ear twice daily x 4 days then facility Nurse to lavage with warm water.   Family/ staff Communication: Reviewed plan of care with patient and facility Nurse.   Labs/tests ordered: None   Kameria Canizares C Lark Runk, NP

## 2016-09-05 ENCOUNTER — Encounter: Payer: Self-pay | Admitting: *Deleted

## 2016-09-10 ENCOUNTER — Other Ambulatory Visit: Payer: Self-pay | Admitting: *Deleted

## 2016-09-12 ENCOUNTER — Encounter: Payer: Self-pay | Admitting: Internal Medicine

## 2016-09-12 ENCOUNTER — Non-Acute Institutional Stay: Payer: Medicare Other | Admitting: Internal Medicine

## 2016-09-12 DIAGNOSIS — K5900 Constipation, unspecified: Secondary | ICD-10-CM

## 2016-09-12 DIAGNOSIS — G308 Other Alzheimer's disease: Secondary | ICD-10-CM

## 2016-09-12 DIAGNOSIS — E039 Hypothyroidism, unspecified: Secondary | ICD-10-CM | POA: Diagnosis not present

## 2016-09-12 DIAGNOSIS — J309 Allergic rhinitis, unspecified: Secondary | ICD-10-CM | POA: Diagnosis not present

## 2016-09-12 DIAGNOSIS — E785 Hyperlipidemia, unspecified: Secondary | ICD-10-CM | POA: Diagnosis not present

## 2016-09-12 DIAGNOSIS — F0280 Dementia in other diseases classified elsewhere without behavioral disturbance: Secondary | ICD-10-CM

## 2016-09-12 DIAGNOSIS — E119 Type 2 diabetes mellitus without complications: Secondary | ICD-10-CM

## 2016-09-12 DIAGNOSIS — I10 Essential (primary) hypertension: Secondary | ICD-10-CM | POA: Diagnosis not present

## 2016-09-12 NOTE — Progress Notes (Signed)
Location:  [place] Room Number: 17 Place of Service:  ALF [phone]) Provider:  Blanchie Serve MD  Blanchie Serve, MD  Patient Care Team: Blanchie Serve, MD as PCP - General (Internal Medicine) McKenzie, Lilia Argue, MD (Inactive) (Anesthesiology) Estill Dooms, MD as Consulting Physician (Internal Medicine) Kristeen Miss, MD as Consulting Physician (Neurosurgery) Shon Hough, MD as Consulting Physician (Ophthalmology) [name], [facility] ([place] and [place]) [name], MD as Consulting Physician (Dermatology) Ngetich, Nelda Bucks, NP as Nurse Practitioner (Family Medicine)  Extended Emergency Contact Information Primary Emergency Contact: [name] Address: [place], [place] of [place] Phone: [phone] Mobile Phone: [phone] Relation: Son  Code Status:  DNR Goals of care: Advanced Directive information Advanced Directives 09/12/2016  Does Patient Have a Medical Advance Directive? Yes  Type of Advance Directive Living will;Out of facility DNR (pink MOST or yellow form)  Does patient want to make changes to medical advance directive? No - Patient declined  Copy of [place] in Chart? No - copy requested  Would patient like information on creating a medical advance directive? -  Pre-existing out of facility DNR order (yellow form or pink MOST form) Yellow form placed in chart (order not valid for inpatient use)     Chief Complaint  Patient presents with  . Medical Management of Chronic Issues    Routine Visit     HPI:  Pt is a 83 y.o. female seen today for medical management of chronic diseases. She denies any concern this visit. No concern from the nurses. She resides in assisted living. She gets around with a cane. No fall reported.  Hypothyroidism- currently on levothyroxine, tolerating it well  Dm type 2- no blood sugar for review. Currently on  metformin 500 mg daily  Allergic rhinitis- denies runny nose. Currently on flonase  Hypertension- few readings with SBP in 150s.currently on lisinopril-hctz 10-12.5 mg daily with amlodipine 5 mg daily  Hyperlipidemia- currently on atorvastatin.    Past Medical History:  Diagnosis Date  . Allergic rhinitis   . Asthma   . Carpal tunnel syndrome 1980  . Chronic low back pain   . Constipation 06/27/2014  . GERD (gastroesophageal reflux disease)   . Hearing loss    Hearing aids  . Hyperglycemia 06/27/2014  . Hyperlipidemia 06/27/2014  . Hypertension   . Insomnia 06/27/2014  . Left leg DVT ([place])    chronic since 2008  . Memory loss 06/27/2014  . Meningioma ([place]) 2000   Dr. Ellene Route  . Osteopenia    Past Surgical History:  Procedure Laterality Date  . BACK SURGERY  1999   ruptured disk  . BRAIN TUMOR EXCISION  2000   Dr. Ellene Route  . CARPAL TUNNEL RELEASE  1980  . TEAR DUCT PROBING Bilateral 2014   Saluidon    No Known Allergies  Outpatient Encounter Prescriptions as of 09/12/2016  Medication Sig  . amLODipine-atorvastatin (CADUET) 5-10 MG tablet TAKE ONE TABLET DAILY FOR BLOOD PRESSURE  . docusate sodium (COLACE) 100 MG capsule Take 100 mg by mouth daily.   Marland Kitchen donepezil (ARICEPT) 10 MG tablet Take 10 mg by mouth at bedtime.  . fluticasone (FLONASE) 50 MCG/ACT nasal spray Place 2 sprays into both nostrils daily.  Marland Kitchen levothyroxine (SYNTHROID, LEVOTHROID) 25 MCG tablet Take by mouth. Take 1.5 tablets by mouth daily.  Marland Kitchen lisinopril-hydrochlorothiazide (PRINZIDE,ZESTORETIC) 10-12.5 MG tablet TAKE 1 TABLET ONCE  A DAY.  . memantine (NAMENDA) 10 MG tablet Take 10 mg by mouth 2 (two) times daily.  . metFORMIN (GLUCOPHAGE) 500 MG tablet One before breakfast to control glucose.  . Multiple Vitamins-Minerals (ICAPS) CAPS Take by mouth. Take one tablet twice daily vitamin for eyes   No facility-administered encounter medications on file as of 09/12/2016.     Review of Systems  Constitutional:  Negative for appetite change and fever.  HENT: Positive for hearing loss. Negative for congestion, rhinorrhea, sinus pressure, sore throat and trouble swallowing.   Respiratory: Negative for cough and shortness of breath.   Cardiovascular: Negative for chest pain, palpitations and leg swelling.  Gastrointestinal: Negative for abdominal pain, constipation, diarrhea, nausea and vomiting.  Genitourinary: Negative for frequency.  Musculoskeletal: Positive for gait problem. Negative for back pain.  Skin: Negative for rash and wound.  Neurological: Negative for seizures, syncope and light-headedness.  Hematological: Does not bruise/bleed easily.  Psychiatric/Behavioral: Positive for confusion. Negative for behavioral problems.    Immunization History  Administered Date(s) Administered  . Influenza-Unspecified 10/20/2013, 10/05/2014, 10/18/2015  . PPD Test 10/10/2013, 03/23/2015, 04/10/2015  . Pneumococcal Polysaccharide-23 02/06/2009  . Td 10/11/2010  . Zoster 01/08/2012   Pertinent  Health Maintenance Due  Topic Date Due  . INFLUENZA VACCINE  10/06/2016 (Originally 08/06/2016)  . DEXA SCAN  Completed  . PNA vac Low Risk Adult  Completed   Fall Risk  08/27/2016 06/12/2015 06/12/2015 03/13/2015 01/09/2015  Falls in the past year? No No No Yes No  Number falls in past yr: - - - 1 -  Injury with Fall? - - - No -   Functional Status Survey:    Vitals:   09/12/16 1504  BP: 140/82  Pulse: 70  Resp: 18  Temp: 98.1 F (36.7 C)  TempSrc: Oral  SpO2: 92%  Weight: 157 lb 9.6 oz (71.5 kg)  Height: 5\' 1"  (1.549 m)   Body mass index is 29.78 kg/m.   Wt Readings from Last 3 Encounters:  09/12/16 157 lb 9.6 oz (71.5 kg)  09/04/16 155 lb 6.4 oz (70.5 kg)  08/27/16 158 lb (71.7 kg)   Physical Exam  Constitutional: No distress.  Overweight patient, in no distress  HENT:  Head: Normocephalic and atraumatic.  Mouth/Throat: Oropharynx is clear and moist. No oropharyngeal exudate.  Eyes: Pupils  are equal, round, and reactive to light. Conjunctivae and EOM are normal.  Neck: Normal range of motion. Neck supple. No JVD present. No thyromegaly present.  Cardiovascular: Normal rate.   No murmur heard. Pulmonary/Chest: Effort normal and breath sounds normal. No respiratory distress. She has no wheezes. She has no rales.  Abdominal: Soft. Bowel sounds are normal. There is no tenderness. There is no guarding.  Musculoskeletal: She exhibits no edema.  Can move all 4 extremities, uses a cane, unsteady gait  Lymphadenopathy:    She has no cervical adenopathy.  Neurological: She is alert.  Oriented to person and place but not to time  Skin: Skin is warm and dry. No rash noted. She is not diaphoretic.  Psychiatric: She has a normal mood and affect.  Has dementia    Labs reviewed:  Recent Labs  03/03/16 05/29/16 09/04/16  NA 140 139 139  K 4.2 4.1 4.2  BUN 24* 29* 29*  CREATININE 1.2* 1.1 1.3*    Recent Labs  09/27/15 03/03/16 05/29/16  AST 16 19 15   ALT 16 20 15   ALKPHOS 45 50 57    Recent Labs  09/27/15 03/03/16  05/29/16  WBC 9.4 8.5 7.9  HGB 12.1 12.4 12.5  HCT 36 38 38  PLT 256 273 242   Lab Results  Component Value Date   TSH 4.21 05/29/2016   Lab Results  Component Value Date   HGBA1C 7.0 09/04/2016   Lab Results  Component Value Date   CHOL 134 03/03/2016   HDL 39 03/03/2016   LDLCALC 62 03/03/2016   TRIG 163 (A) 03/03/2016    Significant Diagnostic Results in last 30 days:  No results found.  Assessment/Plan  Hyperlipidemia Check lipid panel next lab, continue atorvastatin 10 mg daily  HTN Elevated SBP but given her advanced age, continue current regimen of amlodipine with lisinopril-hctz.  Hypothyroidism Lab Results  Component Value Date   TSH 4.21 05/29/2016   Check TSH. Continue levothyroxinr 25 mcg daily  Chronic constipation Continue colace 100 mg daily, maintain hydration  Dm type 2 no blood sugar for review. Currently on  metformin 500 mg daily. Check cbg once a week Lab Results  Component Value Date   HGBA1C 7.0 09/04/2016    Allergic rhinitis No runny nose. Change flonase to prn for 2 weeks and if not requiring. Discontinue it  Dementia without behavioral disturbance MMSE  21/30 this month. Continue donepezil and memantine, supportive care for now   Family/ staff Communication: reviewed care plan with patient and charge nurse.    Labs/tests ordered:  Tsh, lipid panel   Blanchie Serve, MD Internal Medicine [facility] [address] [place], [place] [address] Cell Phone (Monday-Friday 8 am - 5 pm): [phone] On Call: [phone] and follow prompts after 5 pm and on weekends Office Phone: [phone] Office Fax: [phone]

## 2016-09-15 DIAGNOSIS — E785 Hyperlipidemia, unspecified: Secondary | ICD-10-CM | POA: Diagnosis not present

## 2016-09-15 DIAGNOSIS — E079 Disorder of thyroid, unspecified: Secondary | ICD-10-CM | POA: Diagnosis not present

## 2016-09-15 LAB — LIPID PANEL
Cholesterol: 166 (ref 0–200)
HDL: 47 (ref 35–70)
LDL CALC: 93
TRIGLYCERIDES: 157 (ref 40–160)

## 2016-09-15 LAB — TSH: TSH: 6.12 — AB (ref 0.41–5.90)

## 2016-09-16 ENCOUNTER — Non-Acute Institutional Stay: Payer: Medicare Other | Admitting: Family

## 2016-09-16 ENCOUNTER — Encounter: Payer: Self-pay | Admitting: Family

## 2016-09-16 DIAGNOSIS — E039 Hypothyroidism, unspecified: Secondary | ICD-10-CM | POA: Diagnosis not present

## 2016-09-16 MED ORDER — LEVOTHYROXINE SODIUM 50 MCG PO TABS: 50.0000 ug | ORAL_TABLET | Freq: Every day | ORAL | Status: DC

## 2016-09-16 NOTE — Progress Notes (Signed)
Location:  [place] Room Number: 17 Place of Service:  ALF [phone]) Provider: Ronen Bromwell FNP-C  Blanchie Serve, MD  Patient Care Team: Blanchie Serve, MD as PCP - General (Internal Medicine) McKenzie, Lilia Argue, MD (Inactive) (Anesthesiology) Estill Dooms, MD as Consulting Physician (Internal Medicine) Kristeen Miss, MD as Consulting Physician (Neurosurgery) Shon Hough, MD as Consulting Physician (Ophthalmology) [name], [facility] ([place] and [place]) [name], MD as Consulting Physician (Dermatology) Lari Linson, Nelda Bucks, NP as Nurse Practitioner (Family Medicine)  Extended Emergency Contact Information Primary Emergency Contact: [name] Address: [place], [place] of [place] Phone: [phone] Mobile Phone: [phone] Relation: Son  Code Status:  DNR Goals of care: Advanced Directive information Advanced Directives 09/16/2016  Does Patient Have a Medical Advance Directive? Yes  Type of Advance Directive Living will;Out of facility DNR (pink MOST or yellow form)  Does patient want to make changes to medical advance directive? -  Copy of [place] in Chart? -  Would patient like information on creating a medical advance directive? -  Pre-existing out of facility DNR order (yellow form or pink MOST form) Yellow form placed in chart (order not valid for inpatient use)     Chief Complaint  Patient presents with  . Acute Visit    Abnormal labs    HPI:  Pt is a 83 y.o. female seen today at [facility] for an acute visit for evaluation of abnormal lab results. She is seen in her room today. She denies any acute issues this visit. She has a significant medical history of hypothyroidism, Hypertension, Alzheimer disease among other conditions. Her recent lab results showed TSH level 6.12 (09/15/2016). She is currently on Levothyroxine 37.5 mcg  Tablet daily.   Past Medical History:  Diagnosis Date  . Allergic rhinitis   . Asthma   . Carpal tunnel syndrome 1980  . Chronic low back pain   . Constipation 06/27/2014  . GERD (gastroesophageal reflux disease)   . Hearing loss    Hearing aids  . Hyperglycemia 06/27/2014  . Hyperlipidemia 06/27/2014  . Hypertension   . Insomnia 06/27/2014  . Left leg DVT ([place])    chronic since 2008  . Memory loss 06/27/2014  . Meningioma ([place]) 2000   Dr. Ellene Route  . Osteopenia    Past Surgical History:  Procedure Laterality Date  . BACK SURGERY  1999   ruptured disk  . BRAIN TUMOR EXCISION  2000   Dr. Ellene Route  . CARPAL TUNNEL RELEASE  1980  . TEAR DUCT PROBING Bilateral 2014   [place]on    No Known Allergies  Outpatient Encounter Prescriptions as of 09/16/2016  Medication Sig  . amLODipine-atorvastatin (CADUET) 5-10 MG tablet TAKE ONE TABLET DAILY FOR BLOOD PRESSURE  . docusate sodium (COLACE) 100 MG capsule Take 100 mg by mouth daily.   Marland Kitchen donepezil (ARICEPT) 10 MG tablet Take 10 mg by mouth at bedtime.  . fluticasone (FLONASE) 50 MCG/ACT nasal spray Place 2 sprays into both nostrils daily.  Marland Kitchen levothyroxine (SYNTHROID, LEVOTHROID) 25 MCG tablet Take by mouth. Take 1.5 tablets by mouth daily.  Marland Kitchen lisinopril-hydrochlorothiazide (PRINZIDE,ZESTORETIC) 10-12.5 MG tablet TAKE 1 TABLET ONCE A DAY.  . memantine (NAMENDA) 10 MG tablet Take 10 mg by mouth 2 (two) times daily.  . metFORMIN (GLUCOPHAGE) 500 MG tablet One before breakfast to control glucose.  . Multiple Vitamins-Minerals (ICAPS) CAPS Take  by mouth. Take one tablet twice daily vitamin for eyes   No facility-administered encounter medications on file as of 09/16/2016.     Review of Systems  Constitutional: Negative for activity change, appetite change, chills, fatigue and fever.  HENT: Negative for congestion, rhinorrhea, sinus pain, sinus pressure, sneezing and sore throat.   Eyes: Negative for pain, discharge, redness and itching.    Respiratory: Negative for cough, chest tightness, shortness of breath and wheezing.   Cardiovascular: Negative for chest pain, palpitations and leg swelling.  Gastrointestinal: Negative for abdominal distention, abdominal pain, constipation, diarrhea, nausea and vomiting.  Endocrine: Negative for cold intolerance, heat intolerance, polydipsia, polyphagia and polyuria.  Genitourinary: Negative for dysuria, flank pain, frequency and urgency.  Musculoskeletal: Positive for gait problem.  Skin: Negative for color change, pallor and rash.  Neurological: Negative for dizziness, seizures, syncope, light-headedness and headaches.  Psychiatric/Behavioral: Negative for agitation, confusion, hallucinations and sleep disturbance. The patient is not nervous/anxious.     Immunization History  Administered Date(s) Administered  . Influenza-Unspecified 10/20/2013, 10/05/2014, 10/18/2015  . PPD Test 10/10/2013, 03/23/2015, 04/10/2015  . Pneumococcal Polysaccharide-23 02/06/2009  . Td 10/11/2010  . Zoster 01/08/2012   Pertinent  Health Maintenance Due  Topic Date Due  . FOOT EXAM  10/07/1932  . OPHTHALMOLOGY EXAM  10/07/1932  . INFLUENZA VACCINE  10/06/2016 (Originally 08/06/2016)  . HEMOGLOBIN A1C  03/05/2017  . DEXA SCAN  Completed  . PNA vac Low Risk Adult  Completed   Fall Risk  08/27/2016 06/12/2015 06/12/2015 03/13/2015 01/09/2015  Falls in the past year? No No No Yes No  Number falls in past yr: - - - 1 -  Injury with Fall? - - - No -    Vitals:   09/16/16 1109  BP: 140/82  Pulse: 70  Resp: 18  Temp: (!) 96 F (35.6 C)  SpO2: 92%  Weight: 157 lb 9.6 oz (71.5 kg)  Height: 5\' 1"  (1.549 m)   Body mass index is 29.78 kg/m. Physical Exam  Constitutional: She appears well-developed and well-nourished. No distress.  elderly  HENT:  Head: Normocephalic.  Mouth/Throat: Oropharynx is clear and moist. No oropharyngeal exudate.  Eyes: Pupils are equal, round, and reactive to light. Conjunctivae  and EOM are normal. Right eye exhibits no discharge. Left eye exhibits no discharge. No scleral icterus.  Neck: Normal range of motion. No JVD present. No thyromegaly present.  Cardiovascular: Normal rate, regular rhythm, normal heart sounds and intact distal pulses.  Exam reveals no gallop and no friction rub.   No murmur heard. Pulmonary/Chest: Effort normal and breath sounds normal. No respiratory distress. She has no wheezes. She has no rales.  Abdominal: Soft. Bowel sounds are normal. She exhibits no distension. There is no tenderness. There is no rebound and no guarding.  Musculoskeletal: Normal range of motion. She exhibits no edema or tenderness.  Unsteady gait uses cane   Lymphadenopathy:    She has no cervical adenopathy.  Neurological: Coordination normal.  Alert and oriented to person and place but not time.   Skin: Skin is warm and dry. No rash noted. No erythema. No pallor.  Psychiatric: She has a normal mood and affect.   Labs reviewed:  Recent Labs  03/03/16 05/29/16 09/04/16  NA 140 139 139  K 4.2 4.1 4.2  BUN 24* 29* 29*  CREATININE 1.2* 1.1 1.3*    Recent Labs  09/27/15 03/03/16 05/29/16  AST 16 19 15   ALT 16 20 15   ALKPHOS 45 50 57  Recent Labs  09/27/15 03/03/16 05/29/16  WBC 9.4 8.5 7.9  HGB 12.1 12.4 12.5  HCT 36 38 38  PLT 256 273 242   Lab Results  Component Value Date   TSH 6.12 (A) 09/15/2016   Lab Results  Component Value Date   HGBA1C 7.0 09/04/2016   Lab Results  Component Value Date   CHOL 166 09/15/2016   HDL 47 09/15/2016   LDLCALC 93 09/15/2016   TRIG 157 09/15/2016    Significant Diagnostic Results in last 30 days:  No results found.  Assessment/Plan  Hypothyroidism, unspecified type Recent TSH level 6.12 ( 09/15/2016). Increase Levothyroxine to 75mcg Tablet daily before breakfast. Recheck TSH level in 8 weeks.   Family/ staff Communication: Reviewed plan of care with patient and facility Nurse supervisor  Labs/tests  ordered: TSH level in 8 weeks   Sandrea Hughs, NP

## 2016-09-26 ENCOUNTER — Non-Acute Institutional Stay: Payer: Medicare Other | Admitting: Family

## 2016-09-26 ENCOUNTER — Encounter: Payer: Self-pay | Admitting: Family

## 2016-09-26 DIAGNOSIS — R350 Frequency of micturition: Secondary | ICD-10-CM | POA: Diagnosis not present

## 2016-09-26 DIAGNOSIS — N390 Urinary tract infection, site not specified: Secondary | ICD-10-CM | POA: Diagnosis not present

## 2016-09-26 DIAGNOSIS — N3944 Nocturnal enuresis: Secondary | ICD-10-CM

## 2016-09-26 NOTE — Progress Notes (Signed)
Location:  [place] Room Number: 17 Place of Service:  ALF [phone]) Provider: Devarion Mcclanahan FNP-C  Blanchie Serve, MD  Patient Care Team: Blanchie Serve, MD as PCP - General (Internal Medicine) McKenzie, Lilia Argue, MD (Inactive) (Anesthesiology) Estill Dooms, MD as Consulting Physician (Internal Medicine) Kristeen Miss, MD as Consulting Physician (Neurosurgery) Shon Hough, MD as Consulting Physician (Ophthalmology) [name], [facility] ([place]) [name], MD as Consulting Physician (Dermatology) Idalee Foxworthy, Nelda Bucks, NP as Nurse Practitioner (Family Medicine)  Extended Emergency Contact Information Primary Emergency Contact: [name] Address: [place], [place] Phone: [phone] Mobile Phone: [phone] Relation: Son  Code Status:  DNR Goals of care: Advanced Directive information Advanced Directives 09/26/2016  Does Patient Have a Medical Advance Directive? Yes  Type of Advance Directive Out of facility DNR (pink MOST or yellow form);Living will  Does patient want to make changes to medical advance directive? -  Copy of [place] in Chart? -  Would patient like information on creating a medical advance directive? -  Pre-existing out of facility DNR order (yellow form or pink MOST form) Yellow form placed in chart (order not valid for inpatient use)     Chief Complaint  Patient presents with  . Acute Visit    urine frequency and incontinence    HPI:  Pt is a 83 y.o. female seen today at [facility] for an acute visit for evaluation of increased urine frequency and new onset of incontinence at night.she is seen in her room today per facility Nurse request. Nurse reports patient has had increased periods of urine frequency and confusion. Also having new onset of incontinence during the night. Patient states had an episode  during the night that she couldn't make it to the bathroom " Just had to tinkle all the way to the bathroom". She denies any fever, chills, flank or abdominal pain.      Past Medical History:  Diagnosis Date  . Allergic rhinitis   . Asthma   . Carpal tunnel syndrome 1980  . Chronic low back pain   . Constipation 06/27/2014  . GERD (gastroesophageal reflux disease)   . Hearing loss    Hearing aids  . Hyperglycemia 06/27/2014  . Hyperlipidemia 06/27/2014  . Hypertension   . Insomnia 06/27/2014  . Left leg DVT ([place])    chronic since 2008  . Memory loss 06/27/2014  . Meningioma ([place]) 2000   Dr. Ellene Route  . Osteopenia    Past Surgical History:  Procedure Laterality Date  . BACK SURGERY  1999   ruptured disk  . BRAIN TUMOR EXCISION  2000   Dr. Ellene Route  . CARPAL TUNNEL RELEASE  1980  . TEAR DUCT PROBING Bilateral 2014   Saluidon    No Known Allergies  Outpatient Encounter Prescriptions as of 09/26/2016  Medication Sig  . amLODipine-atorvastatin (CADUET) 5-10 MG tablet TAKE ONE TABLET DAILY FOR BLOOD PRESSURE  . docusate sodium (COLACE) 100 MG capsule Take 100 mg by mouth daily.   Marland Kitchen donepezil (ARICEPT) 10 MG tablet Take 10 mg by mouth at bedtime.  . fluticasone (FLONASE) 50 MCG/ACT nasal spray Place 2 sprays into both nostrils daily as needed.   Marland Kitchen levothyroxine (SYNTHROID, LEVOTHROID) 50 MCG tablet Take 1 tablet (50 mcg total) by mouth daily before breakfast.  . lisinopril-hydrochlorothiazide (PRINZIDE,ZESTORETIC) 10-12.5 MG tablet TAKE 1 TABLET ONCE  A DAY.  . memantine (NAMENDA) 10 MG tablet Take 10 mg by mouth 2 (two) times daily.  . metFORMIN (GLUCOPHAGE) 500 MG tablet One before breakfast to control glucose.  . Multiple Vitamins-Minerals (ICAPS) CAPS Take by mouth. Take one tablet twice daily vitamin for eyes   No facility-administered encounter medications on file as of 09/26/2016.     Review of Systems  Constitutional: Negative for activity change, appetite change, chills,  fatigue and fever.  Respiratory: Negative for chest tightness, shortness of breath and wheezing.   Cardiovascular: Negative for chest pain, palpitations and leg swelling.  Gastrointestinal: Negative for abdominal distention, abdominal pain, constipation, diarrhea, nausea and vomiting.  Endocrine: Negative for polydipsia, polyphagia and polyuria.  Genitourinary: Positive for frequency. Negative for dysuria, flank pain and urgency.  Musculoskeletal: Positive for gait problem.  Skin: Negative for color change, pallor and rash.  Psychiatric/Behavioral: Positive for confusion. Negative for agitation, hallucinations and sleep disturbance. The patient is not nervous/anxious.     Immunization History  Administered Date(s) Administered  . Influenza-Unspecified 10/20/2013, 10/05/2014, 10/18/2015  . PPD Test 10/10/2013, 03/23/2015, 04/10/2015  . Pneumococcal Polysaccharide-23 02/06/2009  . Td 10/11/2010  . Zoster 01/08/2012   Pertinent  Health Maintenance Due  Topic Date Due  . FOOT EXAM  10/07/1932  . OPHTHALMOLOGY EXAM  10/07/1932  . INFLUENZA VACCINE  10/06/2016 (Originally 08/06/2016)  . HEMOGLOBIN A1C  03/05/2017  . DEXA SCAN  Completed  . PNA vac Low Risk Adult  Completed   Fall Risk  08/27/2016 06/12/2015 06/12/2015 03/13/2015 01/09/2015  Falls in the past year? No No No Yes No  Number falls in past yr: - - - 1 -  Injury with Fall? - - - No -    Vitals:   09/26/16 1056  BP: (!) 146/67  Pulse: 76  Resp: 18  Temp: 98.2 F (36.8 C)  SpO2: 96%  Weight: 157 lb 9.6 oz (71.5 kg)  Height: 5\' 1"  (1.549 m)   Body mass index is 29.78 kg/m. Physical Exam  Constitutional: She appears well-developed and well-nourished.  Elderly  HENT:  Head: Normocephalic.  Mouth/Throat: Oropharynx is clear and moist. No oropharyngeal exudate.  Eyes: Pupils are equal, round, and reactive to light. Conjunctivae and EOM are normal. Right eye exhibits no discharge. Left eye exhibits no discharge. No scleral  icterus.  Neck: Normal range of motion. No JVD present. No thyromegaly present.  Cardiovascular: Normal rate, regular rhythm, normal heart sounds and intact distal pulses.  Exam reveals no gallop and no friction rub.   No murmur heard. Pulmonary/Chest: Effort normal and breath sounds normal. No respiratory distress. She has no wheezes. She has no rales.  Abdominal: Soft. Bowel sounds are normal. She exhibits no distension and no mass. There is no tenderness. There is no rebound and no guarding.  Musculoskeletal: She exhibits no edema or tenderness.  Unsteady gait uses right hand cane   Lymphadenopathy:    She has no cervical adenopathy.  Neurological: Coordination normal.  Pleasantly confused but cooperative   Skin: Skin is warm and dry. No rash noted. No erythema. No pallor.  Psychiatric: She has a normal mood and affect.   Labs reviewed:  Recent Labs  03/03/16 05/29/16 09/04/16  NA 140 139 139  K 4.2 4.1 4.2  BUN 24* 29* 29*  CREATININE 1.2* 1.1 1.3*    Recent Labs  03/03/16 05/29/16  AST 19 15  ALT 20 15  ALKPHOS 50 57    Recent Labs  03/03/16 05/29/16  WBC 8.5 7.9  HGB 12.4 12.5  HCT 38 38  PLT 273 242   Lab Results  Component Value Date   TSH 6.12 (A) 09/15/2016   Lab Results  Component Value Date   HGBA1C 7.0 09/04/2016   Lab Results  Component Value Date   CHOL 166 09/15/2016   HDL 47 09/15/2016   LDLCALC 93 09/15/2016   TRIG 157 09/15/2016    Significant Diagnostic Results in last 30 days:  No results found.  Assessment/Plan 1. Urine frequency Afebrile.Has had increased frequency.Exam finding negative for flank pain or suprapubic tenderness on palpation.given her new onset of night incontinence will obtain urine specimen to rule out UTI.    2. Nocturnal enuresis New onset of incontinence at night requiring pulls at night.unclear if she has had urgency though reported not making it to the bathroom.encourage to reduce fluid intake in the  evening.Continue to assist with peri-care. Check urine specimen for U/A and C/S rule out UTI.    Family/ staff Communication: Reviewed plan of care with patient and facility Nurse supervisor  Labs/tests ordered: urine specimen for U/A and C/S rule out UTI.  Sandrea Hughs, NP

## 2016-10-15 ENCOUNTER — Encounter: Payer: Medicare Other | Admitting: Internal Medicine

## 2016-10-15 ENCOUNTER — Encounter: Payer: Self-pay | Admitting: Internal Medicine

## 2016-10-15 NOTE — Patient Instructions (Signed)
Opened in error

## 2016-10-15 NOTE — Progress Notes (Signed)
Opened in error

## 2016-10-16 ENCOUNTER — Encounter: Payer: Self-pay | Admitting: Internal Medicine

## 2016-11-06 ENCOUNTER — Encounter: Payer: Self-pay | Admitting: Family

## 2016-11-06 ENCOUNTER — Non-Acute Institutional Stay: Payer: Medicare Other | Admitting: Family

## 2016-11-06 DIAGNOSIS — N183 Chronic kidney disease, stage 3 unspecified: Secondary | ICD-10-CM

## 2016-11-06 DIAGNOSIS — E1122 Type 2 diabetes mellitus with diabetic chronic kidney disease: Secondary | ICD-10-CM | POA: Diagnosis not present

## 2016-11-06 DIAGNOSIS — R2681 Unsteadiness on feet: Secondary | ICD-10-CM | POA: Diagnosis not present

## 2016-11-06 LAB — CBC AND DIFFERENTIAL
HCT: 34 — AB (ref 36–46)
Hemoglobin: 11.6 — AB (ref 12.0–16.0)
NEUTROS ABS: 6145
PLATELETS: 241 (ref 150–399)
WBC: 9.8

## 2016-11-06 LAB — BASIC METABOLIC PANEL
BUN: 39 — AB (ref 4–21)
Creatinine: 1.4 — AB (ref 0.5–1.1)
Glucose: 211
Potassium: 4 (ref 3.4–5.3)
Sodium: 139 (ref 137–147)

## 2016-11-06 LAB — HEMOGLOBIN A1C: HEMOGLOBIN A1C: 7.2

## 2016-11-06 LAB — HEPATIC FUNCTION PANEL
ALT: 13 (ref 7–35)
AST: 14 (ref 13–35)
Alkaline Phosphatase: 58 (ref 25–125)
BILIRUBIN, TOTAL: 0.2

## 2016-11-06 NOTE — Progress Notes (Signed)
Location:  [place] Room Number: 17 Place of Service:  ALF [phone]) Provider: Tyleigh Mahn FNP-C  Blanchie Serve, MD  Patient Care Team: Blanchie Serve, MD as PCP - General (Internal Medicine) McKenzie, Lilia Argue, MD (Inactive) (Anesthesiology) Estill Dooms, MD as Consulting Physician (Internal Medicine) Kristeen Miss, MD as Consulting Physician (Neurosurgery) Shon Hough, MD as Consulting Physician (Ophthalmology) [name], [facility] ([place]) [name], MD as Consulting Physician (Dermatology) Shunte Senseney, Nelda Bucks, NP as Nurse Practitioner (Family Medicine)  Extended Emergency Contact Information Primary Emergency Contact: [name] Address: [address]          [name] Phone: [phone] Mobile Phone: [phone] Relation: Son  Code Status:  DNR Goals of care: Advanced Directive information Advanced Directives 11/06/2016  Does Patient Have a Medical Advance Directive? Yes  Type of Advance Directive Out of facility DNR (pink MOST or yellow form);Living will;Healthcare Power of Attorney  Does patient want to make changes to medical advance directive? -  Copy of [place] in Chart? Yes  Would patient like information on creating a medical advance directive? -  Pre-existing out of facility DNR order (yellow form or pink MOST form) Yellow form placed in chart (order not valid for inpatient use)     Chief Complaint  Patient presents with  . Acute Visit    per staff- suddenly became diaphoretic,clammy and weak this AM    HPI:  Pt is a 83 y.o. female seen today at [facility] for an acute visit for evaluation of diaphoresis, clammy and weakness. She is seen in her room today per facility Nurse request. Nurse reports patient had went to nursing desk prior to breakfast diaphoretic, clammy and weak. Patient was stumbling but was caught by  nursing staff to prevent from falling. Her vital signs was stable and blood sugar checked was 157 previous CBG 140. She denies any acute issues this visit. She has had no other episode since then.she has been playing cards with other residence in the activity room.No reports of fever,chills,cough or signs of urinary tract infection.    Past Medical History:  Diagnosis Date  . Allergic rhinitis   . Asthma   . Carpal tunnel syndrome 1980  . Chronic low back pain   . Constipation 06/27/2014  . GERD (gastroesophageal reflux disease)   . Hearing loss    Hearing aids  . Hyperglycemia 06/27/2014  . Hyperlipidemia 06/27/2014  . Hypertension   . Insomnia 06/27/2014  . Left leg DVT ([place])    chronic since 2008  . Memory loss 06/27/2014  . Meningioma ([place]) 2000   Dr. Ellene Route  . Osteopenia    Past Surgical History:  Procedure Laterality Date  . BACK SURGERY  1999   ruptured disk  . BRAIN TUMOR EXCISION  2000   Dr. Ellene Route  . CARPAL TUNNEL RELEASE  1980  . TEAR DUCT PROBING Bilateral 2014   Saluidon    No Known Allergies  Outpatient Encounter Prescriptions as of 11/06/2016  Medication Sig  . amLODipine-atorvastatin (CADUET) 5-10 MG tablet TAKE ONE TABLET DAILY FOR BLOOD PRESSURE  . docusate sodium (COLACE) 100 MG capsule Take 100 mg by mouth daily.   Marland Kitchen donepezil (ARICEPT) 10 MG tablet Take 10 mg by mouth at bedtime.  . fluticasone (FLONASE) 50 MCG/ACT nasal spray Place 2 sprays into both nostrils daily as needed.   Marland Kitchen levothyroxine (SYNTHROID, LEVOTHROID) 50 MCG tablet Take  1 tablet (50 mcg total) by mouth daily before breakfast.  . lisinopril-hydrochlorothiazide (PRINZIDE,ZESTORETIC) 10-12.5 MG tablet TAKE 1 TABLET ONCE A DAY.  . memantine (NAMENDA) 10 MG tablet Take 10 mg by mouth 2 (two) times daily.  . metFORMIN (GLUCOPHAGE) 500 MG tablet One before breakfast to control glucose.  . Multiple Vitamins-Minerals (ICAPS) CAPS Take by mouth. Take one tablet twice daily vitamin for eyes   No  facility-administered encounter medications on file as of 11/06/2016.     Review of Systems  Constitutional: Negative for appetite change, chills, fatigue and fever.  HENT: Negative for congestion, rhinorrhea, sinus pain, sinus pressure, sneezing and sore throat.   Eyes: Negative for discharge and redness.  Respiratory: Negative for cough, chest tightness, shortness of breath and wheezing.   Cardiovascular: Negative for chest pain, palpitations and leg swelling.  Gastrointestinal: Negative for abdominal distention, abdominal pain, constipation, diarrhea, nausea and vomiting.  Endocrine: Negative for cold intolerance, heat intolerance, polydipsia, polyphagia and polyuria.  Genitourinary: Negative for dysuria, flank pain, frequency and urgency.  Musculoskeletal: Positive for gait problem.  Skin: Negative for color change, pallor and rash.  Neurological: Negative for dizziness, syncope, light-headedness and headaches.  Psychiatric/Behavioral: Negative for agitation, hallucinations and sleep disturbance. The patient is not nervous/anxious.     Immunization History  Administered Date(s) Administered  . Influenza-Unspecified 10/20/2013, 10/05/2014, 10/18/2015, 10/15/2016  . PPD Test 10/10/2013, 03/23/2015, 04/10/2015  . Pneumococcal Polysaccharide-23 02/06/2009  . Td 10/11/2010  . Zoster 01/08/2012   Pertinent  Health Maintenance Due  Topic Date Due  . FOOT EXAM  10/07/1932  . OPHTHALMOLOGY EXAM  10/07/1932  . HEMOGLOBIN A1C  03/05/2017  . INFLUENZA VACCINE  Completed  . DEXA SCAN  Completed  . PNA vac Low Risk Adult  Completed   Fall Risk  08/27/2016 06/12/2015 06/12/2015 03/13/2015 01/09/2015  Falls in the past year? No No No Yes No  Number falls in past yr: - - - 1 -  Injury with Fall? - - - No -    Vitals:   11/06/16 1026  BP: 138/80  Pulse: 64  Resp: 17  Temp: (!) 96.8 F (36 C)  SpO2: 98%  Weight: 159 lb 3.2 oz (72.2 kg)  Height: 5\' 1"  (1.549 m)   Body mass index is 30.08  kg/m. Physical Exam  Constitutional: She appears well-developed and well-nourished.  Elderly in no acute distress   HENT:  Head: Normocephalic.  Right Ear: External ear normal.  Left Ear: External ear normal.  Mouth/Throat: Oropharynx is clear and moist. No oropharyngeal exudate.  Eyes: Pupils are equal, round, and reactive to light. Conjunctivae and EOM are normal. Right eye exhibits no discharge. Left eye exhibits no discharge. No scleral icterus.  Neck: Normal range of motion. No JVD present. No thyromegaly present.  Cardiovascular: Normal rate, regular rhythm, normal heart sounds and intact distal pulses.  Exam reveals no gallop and no friction rub.   No murmur heard. Pulmonary/Chest: Effort normal and breath sounds normal. No respiratory distress. She has no wheezes. She has no rales.  Abdominal: Soft. Bowel sounds are normal. She exhibits no distension. There is no tenderness. There is no rebound and no guarding.  Genitourinary:  Genitourinary Comments: Incontinent   Musculoskeletal: She exhibits no edema or tenderness.  Unsteady gait uses right hand cane   Lymphadenopathy:    She has no cervical adenopathy.  Neurological: Coordination normal.  Pleasantly confused at her baseline.   Skin: Skin is warm and dry. No rash noted. No erythema.  No pallor.  Psychiatric: She has a normal mood and affect.   Labs reviewed:  Recent Labs  03/03/16 05/29/16 09/04/16  NA 140 139 139  K 4.2 4.1 4.2  BUN 24* 29* 29*  CREATININE 1.2* 1.1 1.3*    Recent Labs  03/03/16 05/29/16  AST 19 15  ALT 20 15  ALKPHOS 50 57    Recent Labs  03/03/16 05/29/16  WBC 8.5 7.9  HGB 12.4 12.5  HCT 38 38  PLT 273 242   Lab Results  Component Value Date   TSH 6.12 (A) 09/15/2016   Lab Results  Component Value Date   HGBA1C 7.0 09/04/2016   Lab Results  Component Value Date   CHOL 166 09/15/2016   HDL 47 09/15/2016   LDLCALC 93 09/15/2016   TRIG 157 09/15/2016    Significant  Diagnostic Results in last 30 days:  No results found.  Assessment/Plan 1. Unsteady gait Uses right hand cane.Nurse reported patient was stumbling this morning prior to visit.She remains high risk for fall. Continue Fall and safety precautions.Check CBC/diff and CMP to rule out any infectious or metabolic etiologies.   2. Type 2 diabetes mellitus with stage 3 chronic kidney disease, without long-term current use of insulin Lab Results  Component Value Date   HGBA1C 7.0 09/04/2016  CBG readings in 140's-150's. Nurse reports patient had a diaphoretic and clammy and generalized weakness early prior to visit. CBG was 157 and VSS. She denies any signs of hypoglycemia.continue on metformin 500 mg tablet daily. Recheck Hgb A1C on next lab day.   3. Generalized weakness   Will have her work with PT/OT for ROM, exercise and muscle strengthening. Continue to monitor.    Family/ staff Communication: Reviewed plan of care with patient and facility Nurse supervisor  Labs/tests ordered: Hgb A1C,CBC/diff and CMP with next lab day.   Sandrea Hughs, NP

## 2016-11-07 ENCOUNTER — Encounter: Payer: Self-pay | Admitting: *Deleted

## 2016-11-10 DIAGNOSIS — R2681 Unsteadiness on feet: Secondary | ICD-10-CM | POA: Diagnosis not present

## 2016-11-10 DIAGNOSIS — M6281 Muscle weakness (generalized): Secondary | ICD-10-CM | POA: Diagnosis not present

## 2016-11-10 DIAGNOSIS — R29898 Other symptoms and signs involving the musculoskeletal system: Secondary | ICD-10-CM | POA: Diagnosis not present

## 2016-11-10 DIAGNOSIS — G4700 Insomnia, unspecified: Secondary | ICD-10-CM | POA: Diagnosis not present

## 2016-11-10 DIAGNOSIS — E039 Hypothyroidism, unspecified: Secondary | ICD-10-CM | POA: Diagnosis not present

## 2016-11-10 DIAGNOSIS — I82502 Chronic embolism and thrombosis of unspecified deep veins of left lower extremity: Secondary | ICD-10-CM | POA: Diagnosis not present

## 2016-11-10 DIAGNOSIS — E1122 Type 2 diabetes mellitus with diabetic chronic kidney disease: Secondary | ICD-10-CM | POA: Diagnosis not present

## 2016-11-10 LAB — TSH: TSH: 3.96 (ref 0.41–5.90)

## 2016-11-11 ENCOUNTER — Encounter: Payer: Self-pay | Admitting: *Deleted

## 2016-11-11 DIAGNOSIS — R2681 Unsteadiness on feet: Secondary | ICD-10-CM | POA: Diagnosis not present

## 2016-11-11 DIAGNOSIS — E1122 Type 2 diabetes mellitus with diabetic chronic kidney disease: Secondary | ICD-10-CM | POA: Diagnosis not present

## 2016-11-11 DIAGNOSIS — M6281 Muscle weakness (generalized): Secondary | ICD-10-CM | POA: Diagnosis not present

## 2016-11-11 DIAGNOSIS — R29898 Other symptoms and signs involving the musculoskeletal system: Secondary | ICD-10-CM | POA: Diagnosis not present

## 2016-11-11 DIAGNOSIS — G4700 Insomnia, unspecified: Secondary | ICD-10-CM | POA: Diagnosis not present

## 2016-11-11 DIAGNOSIS — I82502 Chronic embolism and thrombosis of unspecified deep veins of left lower extremity: Secondary | ICD-10-CM | POA: Diagnosis not present

## 2016-11-12 DIAGNOSIS — I82502 Chronic embolism and thrombosis of unspecified deep veins of left lower extremity: Secondary | ICD-10-CM | POA: Diagnosis not present

## 2016-11-12 DIAGNOSIS — E1122 Type 2 diabetes mellitus with diabetic chronic kidney disease: Secondary | ICD-10-CM | POA: Diagnosis not present

## 2016-11-12 DIAGNOSIS — R2681 Unsteadiness on feet: Secondary | ICD-10-CM | POA: Diagnosis not present

## 2016-11-12 DIAGNOSIS — G4700 Insomnia, unspecified: Secondary | ICD-10-CM | POA: Diagnosis not present

## 2016-11-12 DIAGNOSIS — M6281 Muscle weakness (generalized): Secondary | ICD-10-CM | POA: Diagnosis not present

## 2016-11-12 DIAGNOSIS — R29898 Other symptoms and signs involving the musculoskeletal system: Secondary | ICD-10-CM | POA: Diagnosis not present

## 2016-11-14 DIAGNOSIS — M6281 Muscle weakness (generalized): Secondary | ICD-10-CM | POA: Diagnosis not present

## 2016-11-14 DIAGNOSIS — R29898 Other symptoms and signs involving the musculoskeletal system: Secondary | ICD-10-CM | POA: Diagnosis not present

## 2016-11-14 DIAGNOSIS — I82502 Chronic embolism and thrombosis of unspecified deep veins of left lower extremity: Secondary | ICD-10-CM | POA: Diagnosis not present

## 2016-11-14 DIAGNOSIS — E1122 Type 2 diabetes mellitus with diabetic chronic kidney disease: Secondary | ICD-10-CM | POA: Diagnosis not present

## 2016-11-14 DIAGNOSIS — G4700 Insomnia, unspecified: Secondary | ICD-10-CM | POA: Diagnosis not present

## 2016-11-14 DIAGNOSIS — R2681 Unsteadiness on feet: Secondary | ICD-10-CM | POA: Diagnosis not present

## 2016-11-15 DIAGNOSIS — M6281 Muscle weakness (generalized): Secondary | ICD-10-CM | POA: Diagnosis not present

## 2016-11-15 DIAGNOSIS — E1122 Type 2 diabetes mellitus with diabetic chronic kidney disease: Secondary | ICD-10-CM | POA: Diagnosis not present

## 2016-11-15 DIAGNOSIS — R2681 Unsteadiness on feet: Secondary | ICD-10-CM | POA: Diagnosis not present

## 2016-11-15 DIAGNOSIS — I82502 Chronic embolism and thrombosis of unspecified deep veins of left lower extremity: Secondary | ICD-10-CM | POA: Diagnosis not present

## 2016-11-15 DIAGNOSIS — G4700 Insomnia, unspecified: Secondary | ICD-10-CM | POA: Diagnosis not present

## 2016-11-15 DIAGNOSIS — R29898 Other symptoms and signs involving the musculoskeletal system: Secondary | ICD-10-CM | POA: Diagnosis not present

## 2016-11-18 DIAGNOSIS — R2681 Unsteadiness on feet: Secondary | ICD-10-CM | POA: Diagnosis not present

## 2016-11-18 DIAGNOSIS — M6281 Muscle weakness (generalized): Secondary | ICD-10-CM | POA: Diagnosis not present

## 2016-11-18 DIAGNOSIS — E1122 Type 2 diabetes mellitus with diabetic chronic kidney disease: Secondary | ICD-10-CM | POA: Diagnosis not present

## 2016-11-18 DIAGNOSIS — I82502 Chronic embolism and thrombosis of unspecified deep veins of left lower extremity: Secondary | ICD-10-CM | POA: Diagnosis not present

## 2016-11-18 DIAGNOSIS — G4700 Insomnia, unspecified: Secondary | ICD-10-CM | POA: Diagnosis not present

## 2016-11-18 DIAGNOSIS — R29898 Other symptoms and signs involving the musculoskeletal system: Secondary | ICD-10-CM | POA: Diagnosis not present

## 2016-11-19 DIAGNOSIS — G4700 Insomnia, unspecified: Secondary | ICD-10-CM | POA: Diagnosis not present

## 2016-11-19 DIAGNOSIS — R2681 Unsteadiness on feet: Secondary | ICD-10-CM | POA: Diagnosis not present

## 2016-11-19 DIAGNOSIS — R29898 Other symptoms and signs involving the musculoskeletal system: Secondary | ICD-10-CM | POA: Diagnosis not present

## 2016-11-19 DIAGNOSIS — E1122 Type 2 diabetes mellitus with diabetic chronic kidney disease: Secondary | ICD-10-CM | POA: Diagnosis not present

## 2016-11-19 DIAGNOSIS — I82502 Chronic embolism and thrombosis of unspecified deep veins of left lower extremity: Secondary | ICD-10-CM | POA: Diagnosis not present

## 2016-11-19 DIAGNOSIS — M6281 Muscle weakness (generalized): Secondary | ICD-10-CM | POA: Diagnosis not present

## 2016-11-20 DIAGNOSIS — M6281 Muscle weakness (generalized): Secondary | ICD-10-CM | POA: Diagnosis not present

## 2016-11-20 DIAGNOSIS — R2681 Unsteadiness on feet: Secondary | ICD-10-CM | POA: Diagnosis not present

## 2016-11-20 DIAGNOSIS — R29898 Other symptoms and signs involving the musculoskeletal system: Secondary | ICD-10-CM | POA: Diagnosis not present

## 2016-11-20 DIAGNOSIS — E1122 Type 2 diabetes mellitus with diabetic chronic kidney disease: Secondary | ICD-10-CM | POA: Diagnosis not present

## 2016-11-20 DIAGNOSIS — G4700 Insomnia, unspecified: Secondary | ICD-10-CM | POA: Diagnosis not present

## 2016-11-20 DIAGNOSIS — I10 Essential (primary) hypertension: Secondary | ICD-10-CM | POA: Diagnosis not present

## 2016-11-20 DIAGNOSIS — R739 Hyperglycemia, unspecified: Secondary | ICD-10-CM | POA: Diagnosis not present

## 2016-11-20 DIAGNOSIS — E785 Hyperlipidemia, unspecified: Secondary | ICD-10-CM | POA: Diagnosis not present

## 2016-11-20 DIAGNOSIS — I82502 Chronic embolism and thrombosis of unspecified deep veins of left lower extremity: Secondary | ICD-10-CM | POA: Diagnosis not present

## 2016-11-21 DIAGNOSIS — G4700 Insomnia, unspecified: Secondary | ICD-10-CM | POA: Diagnosis not present

## 2016-11-21 DIAGNOSIS — E1122 Type 2 diabetes mellitus with diabetic chronic kidney disease: Secondary | ICD-10-CM | POA: Diagnosis not present

## 2016-11-21 DIAGNOSIS — I82502 Chronic embolism and thrombosis of unspecified deep veins of left lower extremity: Secondary | ICD-10-CM | POA: Diagnosis not present

## 2016-11-21 DIAGNOSIS — R29898 Other symptoms and signs involving the musculoskeletal system: Secondary | ICD-10-CM | POA: Diagnosis not present

## 2016-11-21 DIAGNOSIS — R2681 Unsteadiness on feet: Secondary | ICD-10-CM | POA: Diagnosis not present

## 2016-11-21 DIAGNOSIS — M6281 Muscle weakness (generalized): Secondary | ICD-10-CM | POA: Diagnosis not present

## 2016-11-23 DIAGNOSIS — G4700 Insomnia, unspecified: Secondary | ICD-10-CM | POA: Diagnosis not present

## 2016-11-23 DIAGNOSIS — R29898 Other symptoms and signs involving the musculoskeletal system: Secondary | ICD-10-CM | POA: Diagnosis not present

## 2016-11-23 DIAGNOSIS — I82502 Chronic embolism and thrombosis of unspecified deep veins of left lower extremity: Secondary | ICD-10-CM | POA: Diagnosis not present

## 2016-11-23 DIAGNOSIS — E1122 Type 2 diabetes mellitus with diabetic chronic kidney disease: Secondary | ICD-10-CM | POA: Diagnosis not present

## 2016-11-23 DIAGNOSIS — M6281 Muscle weakness (generalized): Secondary | ICD-10-CM | POA: Diagnosis not present

## 2016-11-23 DIAGNOSIS — R2681 Unsteadiness on feet: Secondary | ICD-10-CM | POA: Diagnosis not present

## 2016-11-24 DIAGNOSIS — G4700 Insomnia, unspecified: Secondary | ICD-10-CM | POA: Diagnosis not present

## 2016-11-24 DIAGNOSIS — E1122 Type 2 diabetes mellitus with diabetic chronic kidney disease: Secondary | ICD-10-CM | POA: Diagnosis not present

## 2016-11-24 DIAGNOSIS — I82502 Chronic embolism and thrombosis of unspecified deep veins of left lower extremity: Secondary | ICD-10-CM | POA: Diagnosis not present

## 2016-11-24 DIAGNOSIS — M6281 Muscle weakness (generalized): Secondary | ICD-10-CM | POA: Diagnosis not present

## 2016-11-24 DIAGNOSIS — R2681 Unsteadiness on feet: Secondary | ICD-10-CM | POA: Diagnosis not present

## 2016-11-24 DIAGNOSIS — R29898 Other symptoms and signs involving the musculoskeletal system: Secondary | ICD-10-CM | POA: Diagnosis not present

## 2016-11-26 DIAGNOSIS — R2681 Unsteadiness on feet: Secondary | ICD-10-CM | POA: Diagnosis not present

## 2016-11-26 DIAGNOSIS — G4700 Insomnia, unspecified: Secondary | ICD-10-CM | POA: Diagnosis not present

## 2016-11-26 DIAGNOSIS — E1122 Type 2 diabetes mellitus with diabetic chronic kidney disease: Secondary | ICD-10-CM | POA: Diagnosis not present

## 2016-11-26 DIAGNOSIS — I82502 Chronic embolism and thrombosis of unspecified deep veins of left lower extremity: Secondary | ICD-10-CM | POA: Diagnosis not present

## 2016-11-26 DIAGNOSIS — R29898 Other symptoms and signs involving the musculoskeletal system: Secondary | ICD-10-CM | POA: Diagnosis not present

## 2016-11-26 DIAGNOSIS — M6281 Muscle weakness (generalized): Secondary | ICD-10-CM | POA: Diagnosis not present

## 2016-11-27 DIAGNOSIS — I82502 Chronic embolism and thrombosis of unspecified deep veins of left lower extremity: Secondary | ICD-10-CM | POA: Diagnosis not present

## 2016-11-27 DIAGNOSIS — M6281 Muscle weakness (generalized): Secondary | ICD-10-CM | POA: Diagnosis not present

## 2016-11-27 DIAGNOSIS — R2681 Unsteadiness on feet: Secondary | ICD-10-CM | POA: Diagnosis not present

## 2016-11-27 DIAGNOSIS — R29898 Other symptoms and signs involving the musculoskeletal system: Secondary | ICD-10-CM | POA: Diagnosis not present

## 2016-11-27 DIAGNOSIS — G4700 Insomnia, unspecified: Secondary | ICD-10-CM | POA: Diagnosis not present

## 2016-11-27 DIAGNOSIS — E1122 Type 2 diabetes mellitus with diabetic chronic kidney disease: Secondary | ICD-10-CM | POA: Diagnosis not present

## 2016-11-29 DIAGNOSIS — E1122 Type 2 diabetes mellitus with diabetic chronic kidney disease: Secondary | ICD-10-CM | POA: Diagnosis not present

## 2016-11-29 DIAGNOSIS — R2681 Unsteadiness on feet: Secondary | ICD-10-CM | POA: Diagnosis not present

## 2016-11-29 DIAGNOSIS — I82502 Chronic embolism and thrombosis of unspecified deep veins of left lower extremity: Secondary | ICD-10-CM | POA: Diagnosis not present

## 2016-11-29 DIAGNOSIS — M6281 Muscle weakness (generalized): Secondary | ICD-10-CM | POA: Diagnosis not present

## 2016-11-29 DIAGNOSIS — R29898 Other symptoms and signs involving the musculoskeletal system: Secondary | ICD-10-CM | POA: Diagnosis not present

## 2016-11-29 DIAGNOSIS — G4700 Insomnia, unspecified: Secondary | ICD-10-CM | POA: Diagnosis not present

## 2016-12-01 DIAGNOSIS — E1122 Type 2 diabetes mellitus with diabetic chronic kidney disease: Secondary | ICD-10-CM | POA: Diagnosis not present

## 2016-12-01 DIAGNOSIS — M6281 Muscle weakness (generalized): Secondary | ICD-10-CM | POA: Diagnosis not present

## 2016-12-01 DIAGNOSIS — G4700 Insomnia, unspecified: Secondary | ICD-10-CM | POA: Diagnosis not present

## 2016-12-01 DIAGNOSIS — I82502 Chronic embolism and thrombosis of unspecified deep veins of left lower extremity: Secondary | ICD-10-CM | POA: Diagnosis not present

## 2016-12-01 DIAGNOSIS — R2681 Unsteadiness on feet: Secondary | ICD-10-CM | POA: Diagnosis not present

## 2016-12-01 DIAGNOSIS — R29898 Other symptoms and signs involving the musculoskeletal system: Secondary | ICD-10-CM | POA: Diagnosis not present

## 2016-12-03 DIAGNOSIS — R29898 Other symptoms and signs involving the musculoskeletal system: Secondary | ICD-10-CM | POA: Diagnosis not present

## 2016-12-03 DIAGNOSIS — E1122 Type 2 diabetes mellitus with diabetic chronic kidney disease: Secondary | ICD-10-CM | POA: Diagnosis not present

## 2016-12-03 DIAGNOSIS — G4700 Insomnia, unspecified: Secondary | ICD-10-CM | POA: Diagnosis not present

## 2016-12-03 DIAGNOSIS — M6281 Muscle weakness (generalized): Secondary | ICD-10-CM | POA: Diagnosis not present

## 2016-12-03 DIAGNOSIS — R2681 Unsteadiness on feet: Secondary | ICD-10-CM | POA: Diagnosis not present

## 2016-12-03 DIAGNOSIS — I82502 Chronic embolism and thrombosis of unspecified deep veins of left lower extremity: Secondary | ICD-10-CM | POA: Diagnosis not present

## 2016-12-04 ENCOUNTER — Encounter: Payer: Self-pay | Admitting: Family

## 2016-12-04 ENCOUNTER — Non-Acute Institutional Stay: Payer: Medicare Other | Admitting: Family

## 2016-12-04 DIAGNOSIS — G4700 Insomnia, unspecified: Secondary | ICD-10-CM | POA: Diagnosis not present

## 2016-12-04 DIAGNOSIS — I129 Hypertensive chronic kidney disease with stage 1 through stage 4 chronic kidney disease, or unspecified chronic kidney disease: Secondary | ICD-10-CM | POA: Diagnosis not present

## 2016-12-04 DIAGNOSIS — I82502 Chronic embolism and thrombosis of unspecified deep veins of left lower extremity: Secondary | ICD-10-CM | POA: Diagnosis not present

## 2016-12-04 DIAGNOSIS — R29898 Other symptoms and signs involving the musculoskeletal system: Secondary | ICD-10-CM | POA: Diagnosis not present

## 2016-12-04 DIAGNOSIS — N183 Chronic kidney disease, stage 3 unspecified: Secondary | ICD-10-CM

## 2016-12-04 DIAGNOSIS — M6281 Muscle weakness (generalized): Secondary | ICD-10-CM | POA: Diagnosis not present

## 2016-12-04 DIAGNOSIS — R2681 Unsteadiness on feet: Secondary | ICD-10-CM | POA: Diagnosis not present

## 2016-12-04 DIAGNOSIS — E1122 Type 2 diabetes mellitus with diabetic chronic kidney disease: Secondary | ICD-10-CM | POA: Diagnosis not present

## 2016-12-04 NOTE — Progress Notes (Signed)
Location:  [facility] Room Number: 17 Place of Service:  ALF (13) Provider:   FNP-C  Pandey, Mahima, MD  Patient Care Team: Pandey, Mahima, MD as PCP - General (Internal Medicine) McKenzie, Edward B, MD (Inactive) (Anesthesiology) Green, Arthur G, MD as Consulting Physician (Internal Medicine) Elsner, Henry, MD as Consulting Physician (Neurosurgery) Stoneburner, Sara, MD as Consulting Physician (Ophthalmology) [facility], [facility] (Skilled Nursing and Assisted Living Facility) Whitworth, Walter, MD as Consulting Physician (Dermatology) ,  C, NP as Nurse Practitioner (Family Medicine)  Extended Emergency Contact Information Primary Emergency Contact: Minjares,Steven P Address: [address]          [place], [place] Home Phone: [phone] Mobile Phone: [phone] Relation: Son  Code Status:  DNR Goals of care: Advanced Directive information Advanced Directives 12/04/2016  Does Patient Have a Medical Advance Directive? Yes  Type of Advance Directive Out of facility DNR (pink MOST or yellow form);Living will;Healthcare Power of Attorney  Does patient want to make changes to medical advance directive? -  Copy of Healthcare Power of Attorney in Chart? Yes  Would patient like information on creating a medical advance directive? -  Pre-existing out of facility DNR order (yellow form or pink MOST form) Yellow form placed in chart (order not valid for inpatient use)     Chief Complaint  Patient presents with  . Acute Visit    Medication Management (determine continuation of Metformin due to GFR)    HPI:  Pt is a 83 y.o. female seen today at [facility] for an acute visit for evaluation of HTN, Type 2 DM,hypothyroidism,Alzheimer disease, GERD,Hx DVT left leg among other conditions.She is seen in her room today.Her recent Hgb A1C 7.2, CR 1.44 with GFR 31.she is currently on Metformin 500 mg Tablet daily. Her  CBG readings reviewed ranging in the 120's-130's. No signs of hypo/hyperglycemia reported.    Past Medical History:  Diagnosis Date  . Allergic rhinitis   . Asthma   . Carpal tunnel syndrome 1980  . Chronic low back pain   . Constipation 06/27/2014  . GERD (gastroesophageal reflux disease)   . Hearing loss    Hearing aids  . Hyperglycemia 06/27/2014  . Hyperlipidemia 06/27/2014  . Hypertension   . Insomnia 06/27/2014  . Left leg DVT (HCC)    chronic since 2008  . Memory loss 06/27/2014  . Meningioma (HCC) 2000   Dr. Elsner  . Osteopenia    Past Surgical History:  Procedure Laterality Date  . BACK SURGERY  1999   ruptured disk  . BRAIN TUMOR EXCISION  2000   Dr. Elsner  . CARPAL TUNNEL RELEASE  1980  . TEAR DUCT PROBING Bilateral 2014   Saluidon    No Known Allergies  Outpatient Encounter Medications as of 12/04/2016  Medication Sig  . amLODipine-atorvastatin (CADUET) 5-10 MG tablet TAKE ONE TABLET DAILY FOR BLOOD PRESSURE  . docusate sodium (COLACE) 100 MG capsule Take 100 mg by mouth daily.   . donepezil (ARICEPT) 10 MG tablet Take 10 mg by mouth at bedtime.  . fluticasone (FLONASE) 50 MCG/ACT nasal spray Place 2 sprays into both nostrils daily as needed.   . levothyroxine (SYNTHROID, LEVOTHROID) 50 MCG tablet Take 1 tablet (50 mcg total) by mouth daily before breakfast.  . lisinopril-hydrochlorothiazide (PRINZIDE,ZESTORETIC) 10-12.5 MG tablet TAKE 1 TABLET ONCE A DAY.  . memantine (NAMENDA) 10 MG tablet Take 10 mg by mouth 2 (two) times daily.  . metFORMIN (GLUCOPHAGE) 500   MG tablet One before breakfast to control glucose.  . Multiple Vitamins-Minerals (ICAPS) CAPS Take by mouth. Take one tablet twice daily vitamin for eyes   No facility-administered encounter medications on file as of 12/04/2016.     Review of Systems  Constitutional: Negative for activity change, appetite change, chills, fatigue and fever.  Respiratory: Negative for cough, chest tightness,  shortness of breath and wheezing.   Cardiovascular: Negative for chest pain, palpitations and leg swelling.  Gastrointestinal: Negative for abdominal distention, abdominal pain, constipation, diarrhea, nausea and vomiting.  Endocrine: Negative for polydipsia, polyphagia and polyuria.  Genitourinary: Negative for flank pain, frequency and urgency.  Musculoskeletal: Positive for gait problem.  Skin: Negative for color change, pallor and rash.  Neurological: Negative for dizziness, light-headedness, numbness and headaches.  Psychiatric/Behavioral: Negative for agitation, confusion and sleep disturbance. The patient is not nervous/anxious.     Immunization History  Administered Date(s) Administered  . Influenza-Unspecified 10/20/2013, 10/05/2014, 10/18/2015, 10/15/2016  . PPD Test 10/10/2013, 03/23/2015, 04/10/2015  . Pneumococcal Polysaccharide-23 02/06/2009  . Td 10/11/2010  . Zoster 01/08/2012   Pertinent  Health Maintenance Due  Topic Date Due  . FOOT EXAM  10/07/1932  . OPHTHALMOLOGY EXAM  10/07/1932  . HEMOGLOBIN A1C  05/06/2017  . INFLUENZA VACCINE  Completed  . DEXA SCAN  Completed  . PNA vac Low Risk Adult  Completed   Fall Risk  08/27/2016 06/12/2015 06/12/2015 03/13/2015 01/09/2015  Falls in the past year? No No No Yes No  Number falls in past yr: - - - 1 -  Injury with Fall? - - - No -    Vitals:   12/04/16 0944  BP: 110/78  Pulse: 67  Resp: 18  Temp: (!) 97.3 F (36.3 C)  SpO2: 96%  Weight: 157 lb 4.8 oz (71.4 kg)  Height: 5' 1" (1.549 m)   Body mass index is 29.72 kg/m. Physical Exam  Constitutional: She appears well-developed and well-nourished.  Elderly in no acute distress  HENT:  Head: Normocephalic.  Mouth/Throat: Oropharynx is clear and moist. No oropharyngeal exudate.  Eyes: Conjunctivae and EOM are normal. Pupils are equal, round, and reactive to light. Left eye exhibits no discharge. No scleral icterus.  Neck: Normal range of motion. No JVD present. No  thyromegaly present.  Cardiovascular: Normal rate, regular rhythm, normal heart sounds and intact distal pulses. Exam reveals no gallop and no friction rub.  No murmur heard. Pulmonary/Chest: Effort normal and breath sounds normal. No respiratory distress. She has no wheezes. She has no rales.  Abdominal: Soft. Bowel sounds are normal. She exhibits no distension. There is no tenderness. There is no rebound and no guarding.  Musculoskeletal: She exhibits no edema or tenderness.  Unsteady gait uses FWW  Lymphadenopathy:    She has no cervical adenopathy.  Neurological: Coordination normal.  Alert and oriented to person,place but not time  Skin: Skin is warm and dry. No rash noted. No erythema.  Psychiatric: She has a normal mood and affect.   Labs reviewed: Recent Labs    05/29/16 09/04/16 11/06/16  NA 139 139 139  K 4.1 4.2 4.0  BUN 29* 29* 39*  CREATININE 1.1 1.3* 1.4*   Recent Labs    03/03/16 05/29/16 11/06/16  AST 19 15 14  ALT 20 15 13  ALKPHOS 50 57 58   Recent Labs    03/03/16 05/29/16 11/06/16  WBC 8.5 7.9 9.8  NEUTROABS  --   --  6,145  HGB 12.4 12.5 11.6*  HCT   38 38 34*  PLT 273 242 241   Lab Results  Component Value Date   TSH 3.96 11/10/2016   Lab Results  Component Value Date   HGBA1C 7.2 11/06/2016   Lab Results  Component Value Date   CHOL 166 09/15/2016   HDL 47 09/15/2016   LDLCALC 93 09/15/2016   TRIG 157 09/15/2016    Significant Diagnostic Results in last 30 days:  No results found.  Assessment/Plan 1. Type 2 DM with CKD stage 3 and hypertension Lab Results  Component Value Date   HGBA1C 7.2 11/06/2016  CBG readings ranging in the 120's-130's currently on Metformin 500 mg Tablet daily. Given her recent CR 1.44 and eGFR 31 (CKD Stage 3) will discontinue Metformin. Continue to monitor CBG weekly and notify provider if CBG> 200  2. Benign hypertensive heart and CKD, stage 3 (GFR 30-59) B/p stable.continue on lisinopril-HZCT 10-12.5 mg  tablet and caduet 5-10 mg tablet daily. CMP in 3 months.   3. CKD (chronic kidney disease) stage 3, GFR 30-59 ml/min CR 1.44 with eGFR 31.continue to avoid nephrotoxins and dose all other medications for renal clearance.  Family/ staff Communication: Reviewed plan of care with patient and facility Nurse.   Labs/tests ordered: Hgb A1C and CMP in 3 months.    C , NP 

## 2016-12-06 DIAGNOSIS — M6281 Muscle weakness (generalized): Secondary | ICD-10-CM | POA: Diagnosis not present

## 2016-12-06 DIAGNOSIS — E1122 Type 2 diabetes mellitus with diabetic chronic kidney disease: Secondary | ICD-10-CM | POA: Diagnosis not present

## 2016-12-06 DIAGNOSIS — G4700 Insomnia, unspecified: Secondary | ICD-10-CM | POA: Diagnosis not present

## 2016-12-06 DIAGNOSIS — R29898 Other symptoms and signs involving the musculoskeletal system: Secondary | ICD-10-CM | POA: Diagnosis not present

## 2016-12-06 DIAGNOSIS — R2681 Unsteadiness on feet: Secondary | ICD-10-CM | POA: Diagnosis not present

## 2016-12-06 DIAGNOSIS — I82502 Chronic embolism and thrombosis of unspecified deep veins of left lower extremity: Secondary | ICD-10-CM | POA: Diagnosis not present

## 2016-12-08 DIAGNOSIS — R2681 Unsteadiness on feet: Secondary | ICD-10-CM | POA: Diagnosis not present

## 2016-12-08 DIAGNOSIS — G4700 Insomnia, unspecified: Secondary | ICD-10-CM | POA: Diagnosis not present

## 2016-12-08 DIAGNOSIS — R29898 Other symptoms and signs involving the musculoskeletal system: Secondary | ICD-10-CM | POA: Diagnosis not present

## 2016-12-08 DIAGNOSIS — I82502 Chronic embolism and thrombosis of unspecified deep veins of left lower extremity: Secondary | ICD-10-CM | POA: Diagnosis not present

## 2016-12-08 DIAGNOSIS — E1122 Type 2 diabetes mellitus with diabetic chronic kidney disease: Secondary | ICD-10-CM | POA: Diagnosis not present

## 2016-12-08 DIAGNOSIS — M6281 Muscle weakness (generalized): Secondary | ICD-10-CM | POA: Diagnosis not present

## 2016-12-09 ENCOUNTER — Encounter: Payer: Self-pay | Admitting: Family

## 2016-12-09 ENCOUNTER — Non-Acute Institutional Stay: Payer: Medicare Other | Admitting: Family

## 2016-12-09 DIAGNOSIS — M6281 Muscle weakness (generalized): Secondary | ICD-10-CM | POA: Diagnosis not present

## 2016-12-09 DIAGNOSIS — F0280 Dementia in other diseases classified elsewhere without behavioral disturbance: Secondary | ICD-10-CM

## 2016-12-09 DIAGNOSIS — G309 Alzheimer's disease, unspecified: Secondary | ICD-10-CM | POA: Diagnosis not present

## 2016-12-09 DIAGNOSIS — I10 Essential (primary) hypertension: Secondary | ICD-10-CM

## 2016-12-09 DIAGNOSIS — I82502 Chronic embolism and thrombosis of unspecified deep veins of left lower extremity: Secondary | ICD-10-CM | POA: Diagnosis not present

## 2016-12-09 DIAGNOSIS — E1122 Type 2 diabetes mellitus with diabetic chronic kidney disease: Secondary | ICD-10-CM | POA: Diagnosis not present

## 2016-12-09 DIAGNOSIS — R2681 Unsteadiness on feet: Secondary | ICD-10-CM

## 2016-12-09 DIAGNOSIS — G4700 Insomnia, unspecified: Secondary | ICD-10-CM | POA: Diagnosis not present

## 2016-12-09 DIAGNOSIS — E039 Hypothyroidism, unspecified: Secondary | ICD-10-CM | POA: Diagnosis not present

## 2016-12-09 DIAGNOSIS — R29898 Other symptoms and signs involving the musculoskeletal system: Secondary | ICD-10-CM | POA: Diagnosis not present

## 2016-12-09 NOTE — Progress Notes (Addendum)
Location:  [place] Room Number: 17 Place of Service:  ALF [phone]) Provider: Rocklin Soderquist FNP-C   Blanchie Serve, MD  Patient Care Team: Blanchie Serve, MD as PCP - General (Internal Medicine) McKenzie, Lilia Argue, MD (Inactive) (Anesthesiology) Estill Dooms, MD as Consulting Physician (Internal Medicine) Kristeen Miss, MD as Consulting Physician (Neurosurgery) Shon Hough, MD as Consulting Physician (Ophthalmology) [name], [facility]) [name], MD as Consulting Physician (Dermatology) Arzella Rehmann, Nelda Bucks, NP as Nurse Practitioner (Family Medicine)  Extended Emergency Contact Information Primary Emergency Contact: [name] Address: [place], [place] Phone: [phone] Mobile Phone: [phone] Relation: Son  Code Status:  DNR Goals of care: Advanced Directive information Advanced Directives 12/09/2016  Does Patient Have a Medical Advance Directive? Yes  Type of Advance Directive Out of facility DNR (pink MOST or yellow form);[place];Living will  Does patient want to make changes to medical advance directive? -  Copy of [place] in Chart? Yes  Would patient like information on creating a medical advance directive? -  Pre-existing out of facility DNR order (yellow form or pink MOST form) Yellow form placed in chart (order not valid for inpatient use)     Chief Complaint  Patient presents with  . Medical Management of Chronic Issues    routine visit    HPI:  Pt is a 83 y.o. female seen today [place] for medical management of chronic diseases.she has a medical history HTN,Type 2 DM,hypothyroidism, Alzheimer's dementia,GERD among other conditions.She is seen in her room today.she denies any acute issues during visit.She continues to participate in facility activities. No recent fall  episodes or weight changes.she currently off metformin no signs or symptoms of hypo/hyperglycemia reported.       Past Medical History:  Diagnosis Date  . Allergic rhinitis   . Asthma   . Carpal tunnel syndrome 1980  . Chronic low back pain   . Constipation 06/27/2014  . GERD (gastroesophageal reflux disease)   . Hearing loss    Hearing aids  . Hyperglycemia 06/27/2014  . Hyperlipidemia 06/27/2014  . Hypertension   . Insomnia 06/27/2014  . Left leg DVT ([place])    chronic since 2008  . Memory loss 06/27/2014  . Meningioma ([place]) 2000   Dr. Ellene Route  . Osteopenia    Past Surgical History:  Procedure Laterality Date  . BACK SURGERY  1999   ruptured disk  . BRAIN TUMOR EXCISION  2000   Dr. Ellene Route  . CARPAL TUNNEL RELEASE  1980  . TEAR DUCT PROBING Bilateral 2014   Saluidon    No Known Allergies  Allergies as of 12/09/2016   No Known Allergies     Medication List        Accurate as of 12/09/16  4:15 PM. Always use your most recent med list.          amLODipine-atorvastatin 5-10 MG tablet Commonly known as:  CADUET TAKE ONE TABLET DAILY FOR BLOOD PRESSURE   docusate sodium 100 MG capsule Commonly known as:  COLACE Take 100 mg by mouth daily.   donepezil 10 MG tablet Commonly known as:  ARICEPT Take 10 mg by mouth at bedtime.   fluticasone 50 MCG/ACT nasal spray Commonly known as:  FLONASE Place 2 sprays into both nostrils daily as needed.   ICAPS Caps Take by mouth. Take  one tablet twice daily vitamin for eyes   levothyroxine 50 MCG tablet Commonly known as:  SYNTHROID, LEVOTHROID Take 1 tablet (50 mcg total) by mouth daily before breakfast.   lisinopril-hydrochlorothiazide 10-12.5 MG tablet Commonly known as:  PRINZIDE,ZESTORETIC TAKE 1 TABLET ONCE A DAY.   memantine 10 MG tablet Commonly known as:  NAMENDA Take 10 mg by mouth 2 (two) times daily.       Review of Systems  Constitutional: Negative for activity change, appetite change, chills, fatigue  and fever.  HENT: Positive for hearing loss. Negative for congestion, rhinorrhea, sinus pressure, sinus pain, sneezing and sore throat.   Eyes: Negative for discharge, redness and itching.  Respiratory: Negative for cough, chest tightness, shortness of breath and wheezing.   Cardiovascular: Negative for chest pain, palpitations and leg swelling.  Gastrointestinal: Negative for abdominal distention, abdominal pain, constipation, diarrhea, nausea and vomiting.  Endocrine: Negative for cold intolerance, heat intolerance, polydipsia, polyphagia and polyuria.  Genitourinary: Positive for urgency. Negative for dysuria, flank pain and frequency.       Incontinent at times  Musculoskeletal: Positive for gait problem.  Skin: Negative for color change, pallor, rash and wound.  Neurological: Negative for dizziness, seizures, syncope, light-headedness and headaches.  Hematological: Does not bruise/bleed easily.  Psychiatric/Behavioral: Negative for agitation, confusion and sleep disturbance. The patient is not nervous/anxious.     Immunization History  Administered Date(s) Administered  . Influenza-Unspecified 10/20/2013, 10/05/2014, 10/18/2015, 10/15/2016  . PPD Test 10/10/2013, 03/23/2015, 04/10/2015  . Pneumococcal Polysaccharide-23 02/06/2009  . Td 10/11/2010  . Zoster 01/08/2012   Pertinent  Health Maintenance Due  Topic Date Due  . FOOT EXAM  10/07/1932  . OPHTHALMOLOGY EXAM  10/07/1932  . HEMOGLOBIN A1C  05/06/2017  . INFLUENZA VACCINE  Completed  . DEXA SCAN  Completed  . PNA vac Low Risk Adult  Completed   Fall Risk  08/27/2016 06/12/2015 06/12/2015 03/13/2015 01/09/2015  Falls in the past year? No No No Yes No  Number falls in past yr: - - - 1 -  Injury with Fall? - - - No -    Vitals:   12/09/16 1108  BP: 110/78  Pulse: 73  Resp: 18  Temp: (!) 97.3 F (36.3 C)  SpO2: 96%  Weight: 152 lb (68.9 kg)  Height: 5\' 1"  (1.549 m)   Body mass index is 28.72 kg/m. Physical Exam    Constitutional: She appears well-developed and well-nourished.  Elderly in no acute distress   HENT:  Head: Normocephalic.  Mouth/Throat: Oropharynx is clear and moist. No oropharyngeal exudate.  Eyes: Conjunctivae and EOM are normal. Pupils are equal, round, and reactive to light. Right eye exhibits no discharge. Left eye exhibits no discharge. No scleral icterus.  Neck: Normal range of motion. No JVD present. No thyromegaly present.  Cardiovascular: Normal rate, regular rhythm, normal heart sounds and intact distal pulses. Exam reveals no gallop and no friction rub.  No murmur heard. Pulmonary/Chest: Effort normal and breath sounds normal. No respiratory distress. She has no wheezes. She has no rales.  Abdominal: Soft. Bowel sounds are normal. She exhibits no distension. There is no tenderness. There is no rebound and no guarding.  Genitourinary:  Genitourinary Comments: Incontinent at times   Musculoskeletal: She exhibits no edema or tenderness.  Unsteady gait uses front wheel walker. UE/LE strength 4/5  Lymphadenopathy:    She has no cervical adenopathy.  Neurological: Coordination normal.  Alert and oriented to person,place but disoriented to day and year  Skin: Skin  is warm and dry. No rash noted. No erythema.  Psychiatric: She has a normal mood and affect.   Labs reviewed: Recent Labs    05/29/16 09/04/16 11/06/16  NA 139 139 139  K 4.1 4.2 4.0  BUN 29* 29* 39*  CREATININE 1.1 1.3* 1.4*   Recent Labs    03/03/16 05/29/16 11/06/16  AST 19 15 14   ALT 20 15 13   ALKPHOS 50 57 58   Recent Labs    03/03/16 05/29/16 11/06/16  WBC 8.5 7.9 9.8  NEUTROABS  --   --  6,145  HGB 12.4 12.5 11.6*  HCT 38 38 34*  PLT 273 242 241   Lab Results  Component Value Date   TSH 3.96 11/10/2016   Lab Results  Component Value Date   HGBA1C 7.2 11/06/2016   Lab Results  Component Value Date   CHOL 166 09/15/2016   HDL 47 09/15/2016   LDLCALC 93 09/15/2016   TRIG 157 09/15/2016     Significant Diagnostic Results in last 30 days:  No results found.  Assessment/Plan 1. Essential hypertension B/p stable.continue on amlodipine-atorvastatin 5-10 mg tablet daily and lisinopril-HCZT 10-12.5 mg tablet daily.Monitor BMP.  2. Hypothyroidism Lab Results  Component Value Date   TSH 3.96 11/10/2016  Continue on levothyroxine 50 mcg tablet daily. Monitor TSH level.   3. Alzheimer's dementia without behavioral disturbance No new behavioral issues.Continue to assist with ADL's as needed. Fall and safety precautions.    4. Unsteady gait Uses front wheel walker and cane sometimes.She remains high risk for falls due to lack of safety awareness.Continue fall and safety precautions.    Family/ staff Communication: Reviewed plan of care with patient and facility Nurse.  Labs/tests ordered: None   Jordane Hisle C Kayani Rapaport, NP  Addendum: Facility Nurse reports patient's evening CBG's ranging in the upper 200's-300's.Will restart Metformin 500 mg tablet take 1/2 Tablet by mouth once daily.Recheck CMP in 2 weeks.continue to monitor.

## 2016-12-10 DIAGNOSIS — R29898 Other symptoms and signs involving the musculoskeletal system: Secondary | ICD-10-CM | POA: Diagnosis not present

## 2016-12-10 DIAGNOSIS — E1122 Type 2 diabetes mellitus with diabetic chronic kidney disease: Secondary | ICD-10-CM | POA: Diagnosis not present

## 2016-12-10 DIAGNOSIS — G4700 Insomnia, unspecified: Secondary | ICD-10-CM | POA: Diagnosis not present

## 2016-12-10 DIAGNOSIS — R2681 Unsteadiness on feet: Secondary | ICD-10-CM | POA: Diagnosis not present

## 2016-12-10 DIAGNOSIS — M6281 Muscle weakness (generalized): Secondary | ICD-10-CM | POA: Diagnosis not present

## 2016-12-10 DIAGNOSIS — I82502 Chronic embolism and thrombosis of unspecified deep veins of left lower extremity: Secondary | ICD-10-CM | POA: Diagnosis not present

## 2016-12-11 DIAGNOSIS — I82502 Chronic embolism and thrombosis of unspecified deep veins of left lower extremity: Secondary | ICD-10-CM | POA: Diagnosis not present

## 2016-12-11 DIAGNOSIS — E1122 Type 2 diabetes mellitus with diabetic chronic kidney disease: Secondary | ICD-10-CM | POA: Diagnosis not present

## 2016-12-11 DIAGNOSIS — G4700 Insomnia, unspecified: Secondary | ICD-10-CM | POA: Diagnosis not present

## 2016-12-11 DIAGNOSIS — R29898 Other symptoms and signs involving the musculoskeletal system: Secondary | ICD-10-CM | POA: Diagnosis not present

## 2016-12-11 DIAGNOSIS — R2681 Unsteadiness on feet: Secondary | ICD-10-CM | POA: Diagnosis not present

## 2016-12-11 DIAGNOSIS — M6281 Muscle weakness (generalized): Secondary | ICD-10-CM | POA: Diagnosis not present

## 2016-12-16 DIAGNOSIS — G4700 Insomnia, unspecified: Secondary | ICD-10-CM | POA: Diagnosis not present

## 2016-12-16 DIAGNOSIS — E1122 Type 2 diabetes mellitus with diabetic chronic kidney disease: Secondary | ICD-10-CM | POA: Diagnosis not present

## 2016-12-16 DIAGNOSIS — R29898 Other symptoms and signs involving the musculoskeletal system: Secondary | ICD-10-CM | POA: Diagnosis not present

## 2016-12-16 DIAGNOSIS — R2681 Unsteadiness on feet: Secondary | ICD-10-CM | POA: Diagnosis not present

## 2016-12-16 DIAGNOSIS — M6281 Muscle weakness (generalized): Secondary | ICD-10-CM | POA: Diagnosis not present

## 2016-12-16 DIAGNOSIS — I82502 Chronic embolism and thrombosis of unspecified deep veins of left lower extremity: Secondary | ICD-10-CM | POA: Diagnosis not present

## 2016-12-17 DIAGNOSIS — G4700 Insomnia, unspecified: Secondary | ICD-10-CM | POA: Diagnosis not present

## 2016-12-17 DIAGNOSIS — M6281 Muscle weakness (generalized): Secondary | ICD-10-CM | POA: Diagnosis not present

## 2016-12-17 DIAGNOSIS — R2681 Unsteadiness on feet: Secondary | ICD-10-CM | POA: Diagnosis not present

## 2016-12-17 DIAGNOSIS — E1122 Type 2 diabetes mellitus with diabetic chronic kidney disease: Secondary | ICD-10-CM | POA: Diagnosis not present

## 2016-12-17 DIAGNOSIS — I82502 Chronic embolism and thrombosis of unspecified deep veins of left lower extremity: Secondary | ICD-10-CM | POA: Diagnosis not present

## 2016-12-17 DIAGNOSIS — R29898 Other symptoms and signs involving the musculoskeletal system: Secondary | ICD-10-CM | POA: Diagnosis not present

## 2016-12-19 DIAGNOSIS — E1122 Type 2 diabetes mellitus with diabetic chronic kidney disease: Secondary | ICD-10-CM | POA: Diagnosis not present

## 2016-12-19 DIAGNOSIS — R2681 Unsteadiness on feet: Secondary | ICD-10-CM | POA: Diagnosis not present

## 2016-12-19 DIAGNOSIS — M6281 Muscle weakness (generalized): Secondary | ICD-10-CM | POA: Diagnosis not present

## 2016-12-19 DIAGNOSIS — I82502 Chronic embolism and thrombosis of unspecified deep veins of left lower extremity: Secondary | ICD-10-CM | POA: Diagnosis not present

## 2016-12-19 DIAGNOSIS — G4700 Insomnia, unspecified: Secondary | ICD-10-CM | POA: Diagnosis not present

## 2016-12-19 DIAGNOSIS — R29898 Other symptoms and signs involving the musculoskeletal system: Secondary | ICD-10-CM | POA: Diagnosis not present

## 2016-12-22 DIAGNOSIS — I82502 Chronic embolism and thrombosis of unspecified deep veins of left lower extremity: Secondary | ICD-10-CM | POA: Diagnosis not present

## 2016-12-22 DIAGNOSIS — E1122 Type 2 diabetes mellitus with diabetic chronic kidney disease: Secondary | ICD-10-CM | POA: Diagnosis not present

## 2016-12-22 DIAGNOSIS — R2681 Unsteadiness on feet: Secondary | ICD-10-CM | POA: Diagnosis not present

## 2016-12-22 DIAGNOSIS — R29898 Other symptoms and signs involving the musculoskeletal system: Secondary | ICD-10-CM | POA: Diagnosis not present

## 2016-12-22 DIAGNOSIS — G4700 Insomnia, unspecified: Secondary | ICD-10-CM | POA: Diagnosis not present

## 2016-12-22 DIAGNOSIS — M6281 Muscle weakness (generalized): Secondary | ICD-10-CM | POA: Diagnosis not present

## 2017-01-07 DIAGNOSIS — M25531 Pain in right wrist: Secondary | ICD-10-CM | POA: Diagnosis not present

## 2017-01-07 DIAGNOSIS — M79641 Pain in right hand: Secondary | ICD-10-CM | POA: Diagnosis not present

## 2017-01-09 ENCOUNTER — Non-Acute Institutional Stay: Payer: Medicare Other | Admitting: Family

## 2017-01-09 DIAGNOSIS — M6281 Muscle weakness (generalized): Secondary | ICD-10-CM | POA: Diagnosis not present

## 2017-01-09 DIAGNOSIS — M25531 Pain in right wrist: Secondary | ICD-10-CM

## 2017-01-09 DIAGNOSIS — R739 Hyperglycemia, unspecified: Secondary | ICD-10-CM

## 2017-01-09 DIAGNOSIS — R4789 Other speech disturbances: Secondary | ICD-10-CM | POA: Diagnosis not present

## 2017-01-09 DIAGNOSIS — I82502 Chronic embolism and thrombosis of unspecified deep veins of left lower extremity: Secondary | ICD-10-CM | POA: Diagnosis not present

## 2017-01-09 DIAGNOSIS — G4700 Insomnia, unspecified: Secondary | ICD-10-CM | POA: Diagnosis not present

## 2017-01-09 DIAGNOSIS — G309 Alzheimer's disease, unspecified: Secondary | ICD-10-CM | POA: Diagnosis not present

## 2017-01-09 NOTE — Progress Notes (Signed)
Location:  [facility] Room Number: 17 Place of Service:  ALF [phone]) Provider: Lala Been FNP-C  Blanchie Serve, MD  Patient Care Team: Blanchie Serve, MD as PCP - General (Internal Medicine) McKenzie, Lilia Argue, MD (Inactive) (Anesthesiology) Estill Dooms, MD as Consulting Physician (Internal Medicine) Kristeen Miss, MD as Consulting Physician (Neurosurgery) Shon Hough, MD as Consulting Physician (Ophthalmology) [name], [facility] ([place] and [place]) [name], MD as Consulting Physician (Dermatology) Naureen Benton, Nelda Bucks, NP as Nurse Practitioner (Family Medicine)  Extended Emergency Contact Information Primary Emergency Contact: [name] Address: [place], [place] Phone: [phone] Mobile Phone: [phone] Relation: Son  Code Status:  DNR Goals of care: Advanced Directive information Advanced Directives 12/09/2016  Does Patient Have a Medical Advance Directive? Yes  Type of Advance Directive Out of facility DNR (pink MOST or yellow form);[place];Living will  Does patient want to make changes to medical advance directive? -  Copy of [place] in Chart? Yes  Would patient like information on creating a medical advance directive? -  Pre-existing out of facility DNR order (yellow form or pink MOST form) Yellow form placed in chart (order not valid for inpatient use)     Chief Complaint  Patient presents with  . Acute Visit    right hand/wrist pain and swelling    HPI:  Pt is a 84 y.o. female seen today at [facility] for an acute visit for evaluation of right wrist pain.She is seen in her room today per facility Nurse request.Nurse reports patient complained of right wrist pain x-ray done showed no acute fracture but arthritic changes to hand noted.Patient states " I accidentally slapped right hand on the  door downstairs".unclear where downstairs though Nurse states patient walks on facility hallway to front door. Patient unable to push walker with her hand.she denies any fever or chills.    Past Medical History:  Diagnosis Date  . Allergic rhinitis   . Asthma   . Carpal tunnel syndrome 1980  . Chronic low back pain   . Constipation 06/27/2014  . GERD (gastroesophageal reflux disease)   . Hearing loss    Hearing aids  . Hyperglycemia 06/27/2014  . Hyperlipidemia 06/27/2014  . Hypertension   . Insomnia 06/27/2014  . Left leg DVT ([place])    chronic since 2008  . Memory loss 06/27/2014  . Meningioma ([place]) 2000   Dr. Ellene Route  . Osteopenia    Past Surgical History:  Procedure Laterality Date  . BACK SURGERY  1999   ruptured disk  . BRAIN TUMOR EXCISION  2000   Dr. Ellene Route  . CARPAL TUNNEL RELEASE  1980  . TEAR DUCT PROBING Bilateral 2014   Saluidon    No Known Allergies  Outpatient Encounter Medications as of 01/09/2017  Medication Sig  . amLODipine-atorvastatin (CADUET) 5-10 MG tablet TAKE ONE TABLET DAILY FOR BLOOD PRESSURE  . docusate sodium (COLACE) 100 MG capsule Take 100 mg by mouth daily.   Marland Kitchen donepezil (ARICEPT) 10 MG tablet Take 10 mg by mouth at bedtime.  . fluticasone (FLONASE) 50 MCG/ACT nasal spray Place 2 sprays into both nostrils daily as needed.   Marland Kitchen levothyroxine (SYNTHROID, LEVOTHROID) 50 MCG tablet Take 1 tablet (50 mcg total) by mouth daily before breakfast.  . lisinopril-hydrochlorothiazide (PRINZIDE,ZESTORETIC) 10-12.5 MG tablet TAKE 1 TABLET ONCE A DAY.  . memantine (NAMENDA)  10 MG tablet Take 10 mg by mouth 2 (two) times daily.  . metFORMIN (GLUCOPHAGE) 500 MG tablet Take 250 mg by mouth daily with breakfast.  . Multiple Vitamins-Minerals (ICAPS) CAPS Take by mouth. Take one tablet twice daily vitamin for eyes   No facility-administered encounter medications on file as of 01/09/2017.     Review of Systems  Constitutional: Negative for activity change, chills,  fatigue and fever.  Respiratory: Negative for cough, chest tightness, shortness of breath and wheezing.   Cardiovascular: Negative for chest pain, palpitations and leg swelling.  Gastrointestinal: Negative for abdominal distention, abdominal pain, constipation, diarrhea, nausea and vomiting.  Musculoskeletal: Positive for gait problem.       Right wrist pain   Skin: Negative for color change, pallor and rash.  Neurological: Negative for dizziness, light-headedness and headaches.  Psychiatric/Behavioral: Positive for confusion. Negative for agitation and sleep disturbance. The patient is not nervous/anxious.     Immunization History  Administered Date(s) Administered  . Influenza-Unspecified 10/20/2013, 10/05/2014, 10/18/2015, 10/15/2016  . PPD Test 10/10/2013, 03/23/2015, 04/10/2015  . Pneumococcal Polysaccharide-23 02/06/2009  . Td 10/11/2010  . Zoster 01/08/2012   Pertinent  Health Maintenance Due  Topic Date Due  . FOOT EXAM  10/07/1932  . OPHTHALMOLOGY EXAM  10/07/1932  . HEMOGLOBIN A1C  05/06/2017  . INFLUENZA VACCINE  Completed  . DEXA SCAN  Completed  . PNA vac Low Risk Adult  Completed   Fall Risk  08/27/2016 06/12/2015 06/12/2015 03/13/2015 01/09/2015  Falls in the past year? No No No Yes No  Number falls in past yr: - - - 1 -  Injury with Fall? - - - No -    Vitals:   01/09/17 1516  BP: (!) 126/54  Pulse: 73  Resp: 18  Temp: 97.6 F (36.4 C)  SpO2: 98%  Weight: 149 lb 9.6 oz (67.9 kg)  Height: 5\' 1"  (1.549 m)   Body mass index is 28.27 kg/m. Physical Exam  Constitutional: She appears well-developed. No distress.  Frail elderly in no acute distress   HENT:  Head: Normocephalic.  Mouth/Throat: Oropharynx is clear and moist. No oropharyngeal exudate.  Eyes: Conjunctivae and EOM are normal. Pupils are equal, round, and reactive to light. Right eye exhibits no discharge. Left eye exhibits no discharge. No scleral icterus.  Neck: Normal range of motion. No JVD  present. No thyromegaly present.  Cardiovascular: Normal rate, regular rhythm, normal heart sounds and intact distal pulses. Exam reveals no gallop and no friction rub.  No murmur heard. Pulmonary/Chest: Effort normal and breath sounds normal. No respiratory distress. She has no wheezes. She has no rales.  Abdominal: Soft. Bowel sounds are normal. She exhibits no distension. There is no tenderness. There is no rebound and no guarding.  Musculoskeletal: She exhibits no tenderness.  Right wrist/hand slight swelling pain with palpation and ROM on wrist area. No redness noted.  Unsteady gait uses FWW but unable to push with right hand due to pain.   Lymphadenopathy:    She has no cervical adenopathy.  Neurological: Coordination normal.  Alert and oriented to person and place but not time.   Skin: Skin is warm and dry. No rash noted. No erythema.  Psychiatric: She has a normal mood and affect.   Labs reviewed: Recent Labs    05/29/16 09/04/16 11/06/16  NA 139 139 139  K 4.1 4.2 4.0  BUN 29* 29* 39*  CREATININE 1.1 1.3* 1.4*   Recent Labs    03/03/16 05/29/16 11/06/16  AST 19 15 14   ALT 20 15 13   ALKPHOS 50 57 58   Recent Labs    03/03/16 05/29/16 11/06/16  WBC 8.5 7.9 9.8  NEUTROABS  --   --  6,145  HGB 12.4 12.5 11.6*  HCT 38 38 34*  PLT 273 242 241   Lab Results  Component Value Date   TSH 3.96 11/10/2016   Lab Results  Component Value Date   HGBA1C 7.2 11/06/2016   Lab Results  Component Value Date   CHOL 166 09/15/2016   HDL 47 09/15/2016   LDLCALC 93 09/15/2016   TRIG 157 09/15/2016    Significant Diagnostic Results in last 30 days:  No results found.  Assessment/Plan   Acute pain of right wrist Etiology unclear patient reports slapped hand accidentally on door downstairs though there's no downstairs in the facility.right wrist X-ray showed no acute fracture.Start on extra strength Tylenol 1000 mg tablet twice daily and 500 mg tablet daily at 2 Pm x 14 days.  PT/OT to evaluate and treat as indicated for pain.continue to monitor.    Hyperglycemia  She has CBG readings in the upper 200's-300's in the evenings.Chocolate and candy noted in the patient's room.facility Nurse notified patient's POA to provide sugar free candy for patient.Nurse states has removed all candy from patient's room per POA request. Continue on CCD and sugar free snacks. D/c order to notify provider if CBG's > 350.Continue to monitor   Family/ staff Communication: Reviewed plan of care with patient and facility Nurse.   Labs/tests ordered: None   Arlo Butt C Vada Swift, NP

## 2017-01-12 DIAGNOSIS — I82502 Chronic embolism and thrombosis of unspecified deep veins of left lower extremity: Secondary | ICD-10-CM | POA: Diagnosis not present

## 2017-01-12 DIAGNOSIS — R4789 Other speech disturbances: Secondary | ICD-10-CM | POA: Diagnosis not present

## 2017-01-12 DIAGNOSIS — G309 Alzheimer's disease, unspecified: Secondary | ICD-10-CM | POA: Diagnosis not present

## 2017-01-12 DIAGNOSIS — G4700 Insomnia, unspecified: Secondary | ICD-10-CM | POA: Diagnosis not present

## 2017-01-12 DIAGNOSIS — M25531 Pain in right wrist: Secondary | ICD-10-CM | POA: Diagnosis not present

## 2017-01-12 DIAGNOSIS — M6281 Muscle weakness (generalized): Secondary | ICD-10-CM | POA: Diagnosis not present

## 2017-01-14 DIAGNOSIS — I82502 Chronic embolism and thrombosis of unspecified deep veins of left lower extremity: Secondary | ICD-10-CM | POA: Diagnosis not present

## 2017-01-14 DIAGNOSIS — M6281 Muscle weakness (generalized): Secondary | ICD-10-CM | POA: Diagnosis not present

## 2017-01-14 DIAGNOSIS — R4789 Other speech disturbances: Secondary | ICD-10-CM | POA: Diagnosis not present

## 2017-01-14 DIAGNOSIS — G309 Alzheimer's disease, unspecified: Secondary | ICD-10-CM | POA: Diagnosis not present

## 2017-01-14 DIAGNOSIS — M25531 Pain in right wrist: Secondary | ICD-10-CM | POA: Diagnosis not present

## 2017-01-14 DIAGNOSIS — G4700 Insomnia, unspecified: Secondary | ICD-10-CM | POA: Diagnosis not present

## 2017-01-16 DIAGNOSIS — R4789 Other speech disturbances: Secondary | ICD-10-CM | POA: Diagnosis not present

## 2017-01-16 DIAGNOSIS — G309 Alzheimer's disease, unspecified: Secondary | ICD-10-CM | POA: Diagnosis not present

## 2017-01-16 DIAGNOSIS — I82502 Chronic embolism and thrombosis of unspecified deep veins of left lower extremity: Secondary | ICD-10-CM | POA: Diagnosis not present

## 2017-01-16 DIAGNOSIS — M6281 Muscle weakness (generalized): Secondary | ICD-10-CM | POA: Diagnosis not present

## 2017-01-16 DIAGNOSIS — G4700 Insomnia, unspecified: Secondary | ICD-10-CM | POA: Diagnosis not present

## 2017-01-16 DIAGNOSIS — M25531 Pain in right wrist: Secondary | ICD-10-CM | POA: Diagnosis not present

## 2017-01-19 DIAGNOSIS — M25531 Pain in right wrist: Secondary | ICD-10-CM | POA: Diagnosis not present

## 2017-01-19 DIAGNOSIS — I82502 Chronic embolism and thrombosis of unspecified deep veins of left lower extremity: Secondary | ICD-10-CM | POA: Diagnosis not present

## 2017-01-19 DIAGNOSIS — G4700 Insomnia, unspecified: Secondary | ICD-10-CM | POA: Diagnosis not present

## 2017-01-19 DIAGNOSIS — R4789 Other speech disturbances: Secondary | ICD-10-CM | POA: Diagnosis not present

## 2017-01-19 DIAGNOSIS — G309 Alzheimer's disease, unspecified: Secondary | ICD-10-CM | POA: Diagnosis not present

## 2017-01-19 DIAGNOSIS — M6281 Muscle weakness (generalized): Secondary | ICD-10-CM | POA: Diagnosis not present

## 2017-01-19 DIAGNOSIS — R739 Hyperglycemia, unspecified: Secondary | ICD-10-CM | POA: Diagnosis not present

## 2017-01-19 DIAGNOSIS — I10 Essential (primary) hypertension: Secondary | ICD-10-CM | POA: Diagnosis not present

## 2017-01-19 DIAGNOSIS — E785 Hyperlipidemia, unspecified: Secondary | ICD-10-CM | POA: Diagnosis not present

## 2017-01-19 LAB — HEPATIC FUNCTION PANEL
ALK PHOS: 53 (ref 25–125)
ALT: 20 (ref 7–35)
AST: 12 — AB (ref 13–35)
BILIRUBIN, TOTAL: 0.4

## 2017-01-19 LAB — BASIC METABOLIC PANEL
BUN: 40 — AB (ref 4–21)
Creatinine: 1.5 — AB (ref 0.5–1.1)
Glucose: 108
Potassium: 4.2 (ref 3.4–5.3)
SODIUM: 143 (ref 137–147)

## 2017-01-21 ENCOUNTER — Non-Acute Institutional Stay: Payer: Medicare Other | Admitting: Family

## 2017-01-21 ENCOUNTER — Encounter: Payer: Self-pay | Admitting: Family

## 2017-01-21 DIAGNOSIS — G309 Alzheimer's disease, unspecified: Secondary | ICD-10-CM | POA: Diagnosis not present

## 2017-01-21 DIAGNOSIS — R4789 Other speech disturbances: Secondary | ICD-10-CM | POA: Diagnosis not present

## 2017-01-21 DIAGNOSIS — G4700 Insomnia, unspecified: Secondary | ICD-10-CM | POA: Diagnosis not present

## 2017-01-21 DIAGNOSIS — N183 Chronic kidney disease, stage 3 (moderate): Secondary | ICD-10-CM | POA: Diagnosis not present

## 2017-01-21 DIAGNOSIS — E1122 Type 2 diabetes mellitus with diabetic chronic kidney disease: Secondary | ICD-10-CM

## 2017-01-21 DIAGNOSIS — M25531 Pain in right wrist: Secondary | ICD-10-CM | POA: Diagnosis not present

## 2017-01-21 DIAGNOSIS — I82502 Chronic embolism and thrombosis of unspecified deep veins of left lower extremity: Secondary | ICD-10-CM | POA: Diagnosis not present

## 2017-01-21 DIAGNOSIS — M6281 Muscle weakness (generalized): Secondary | ICD-10-CM | POA: Diagnosis not present

## 2017-01-21 NOTE — Progress Notes (Signed)
Location:  [place] Room Number: 17 Place of Service:  ALF [phone]) Provider: Moriya Mitchell FNP-C  Blanchie Serve, MD  Patient Care Team: Blanchie Serve, MD as PCP - General (Internal Medicine) McKenzie, Lilia Argue, MD (Inactive) (Anesthesiology) Estill Dooms, MD as Consulting Physician (Internal Medicine) Kristeen Miss, MD as Consulting Physician (Neurosurgery) Shon Hough, MD as Consulting Physician (Ophthalmology) [name], [facility] ([place] and [place]) [name], MD as Consulting Physician (Dermatology) Taneisha Fuson, Nelda Bucks, NP as Nurse Practitioner (Family Medicine)  Extended Emergency Contact Information Primary Emergency Contact: [name] Address: [place], [place] Phone: [phone] Mobile Phone: [phone] Relation: Son  Code Status:  DNR Goals of care: Advanced Directive information Advanced Directives 01/21/2017  Does Patient Have a Medical Advance Directive? Yes  Type of Advance Directive Living will;Out of facility DNR (pink MOST or yellow form)  Does patient want to make changes to medical advance directive? No - Patient declined  Copy of [place] in Chart? -  Would patient like information on creating a medical advance directive? -  Pre-existing out of facility DNR order (yellow form or pink MOST form) Yellow form placed in chart (order not valid for inpatient use)     Chief Complaint  Patient presents with  . Acute Visit    Elevated blood sugar     HPI:  Pt is a 84 y.o. female seen today at [facility] for an acute visit for evaluation of high blood sugar.she is seen in her room today per facility Nurse request.Facility Nurse reports patient's blood sugars continues to be in the upper 200's.Patient still has candy in the room.Nurse states candy was previously removed from the patient's room and family notified to  provide sugar free candy/chocolate but patient able to obtain candy from vending machine.no signs of hypo/hyperglycemia reported.    Past Medical History:  Diagnosis Date  . Allergic rhinitis   . Asthma   . Carpal tunnel syndrome 1980  . Chronic low back pain   . Constipation 06/27/2014  . GERD (gastroesophageal reflux disease)   . Hearing loss    Hearing aids  . Hyperglycemia 06/27/2014  . Hyperlipidemia 06/27/2014  . Hypertension   . Insomnia 06/27/2014  . Left leg DVT ([place])    chronic since 2008  . Memory loss 06/27/2014  . Meningioma ([place]) 2000   Dr. Ellene Route  . Osteopenia    Past Surgical History:  Procedure Laterality Date  . BACK SURGERY  1999   ruptured disk  . BRAIN TUMOR EXCISION  2000   Dr. Ellene Route  . CARPAL TUNNEL RELEASE  1980  . TEAR DUCT PROBING Bilateral 2014   Saluidon    No Known Allergies  Outpatient Encounter Medications as of 01/21/2017  Medication Sig  . acetaminophen (TYLENOL) 500 MG tablet Take 1,000 mg by mouth 2 (two) times daily. 8 AM and 7 PM  . acetaminophen (TYLENOL) 500 MG tablet Take 500 mg by mouth daily. 2 PM  . amLODipine-atorvastatin (CADUET) 5-10 MG tablet TAKE ONE TABLET DAILY FOR BLOOD PRESSURE  . docusate sodium (COLACE) 100 MG capsule Take 100 mg by mouth daily.   Marland Kitchen donepezil (ARICEPT) 10 MG tablet Take 10 mg by mouth at bedtime.  . fluticasone (FLONASE) 50 MCG/ACT nasal spray Place 2 sprays into both nostrils daily as needed.   Marland Kitchen levothyroxine (SYNTHROID, LEVOTHROID) 50 MCG tablet Take  1 tablet (50 mcg total) by mouth daily before breakfast.  . lisinopril-hydrochlorothiazide (PRINZIDE,ZESTORETIC) 10-12.5 MG tablet TAKE 1 TABLET ONCE A DAY.  . memantine (NAMENDA) 10 MG tablet Take 10 mg by mouth 2 (two) times daily.  . metFORMIN (GLUCOPHAGE) 500 MG tablet Take 250 mg by mouth daily with breakfast.  . Multiple Vitamins-Minerals (ICAPS) CAPS Take 1 capsule by mouth 2 (two) times daily. For eyes   No facility-administered encounter  medications on file as of 01/21/2017.     Review of Systems  Constitutional: Negative for appetite change, chills, fatigue and fever.  Respiratory: Negative for cough, chest tightness, shortness of breath and wheezing.   Cardiovascular: Negative for palpitations and leg swelling.  Gastrointestinal: Negative for abdominal distention, abdominal pain and constipation.  Endocrine: Negative for polydipsia, polyphagia and polyuria.  Musculoskeletal: Positive for gait problem.  Skin: Negative for color change, pallor and rash.  Neurological: Negative for dizziness, syncope, light-headedness and headaches.  Psychiatric/Behavioral: Negative for agitation and sleep disturbance. The patient is not nervous/anxious.     Immunization History  Administered Date(s) Administered  . Influenza-Unspecified 10/20/2013, 10/05/2014, 10/18/2015, 10/15/2016  . PPD Test 10/10/2013, 03/23/2015, 04/10/2015  . Pneumococcal Polysaccharide-23 02/06/2009  . Td 10/11/2010  . Zoster 01/08/2012   Pertinent  Health Maintenance Due  Topic Date Due  . FOOT EXAM  10/07/1932  . OPHTHALMOLOGY EXAM  10/07/1932  . HEMOGLOBIN A1C  05/06/2017  . INFLUENZA VACCINE  Completed  . DEXA SCAN  Completed  . PNA vac Low Risk Adult  Completed   Fall Risk  08/27/2016 06/12/2015 06/12/2015 03/13/2015 01/09/2015  Falls in the past year? No No No Yes No  Number falls in past yr: - - - 1 -  Injury with Fall? - - - No -    Vitals:   01/21/17 1106  BP: (!) 134/57  Pulse: 80  Resp: 20  Temp: 98 F (36.7 C)  TempSrc: Oral  SpO2: 93%  Weight: 149 lb 9.6 oz (67.9 kg)  Height: 5\' 1"  (1.549 m)   Body mass index is 28.27 kg/m. Physical Exam  Constitutional: She appears well-developed.  Elderly in no acute distress   HENT:  Head: Normocephalic.  Mouth/Throat: Oropharynx is clear and moist. No oropharyngeal exudate.  Eyes: Conjunctivae and EOM are normal. Pupils are equal, round, and reactive to light. Right eye exhibits no discharge.  Left eye exhibits no discharge. No scleral icterus.  Neck: Normal range of motion. No JVD present. No thyromegaly present.  Cardiovascular: Normal rate, regular rhythm, normal heart sounds and intact distal pulses. Exam reveals no gallop and no friction rub.  No murmur heard. Pulmonary/Chest: Effort normal and breath sounds normal. No respiratory distress. She has no wheezes. She has no rales.  Abdominal: Soft. Bowel sounds are normal. She exhibits no distension. There is no tenderness. There is no rebound and no guarding.  Musculoskeletal: She exhibits no edema or tenderness.  Unsteady gait uses FWW  Lymphadenopathy:    She has no cervical adenopathy.  Neurological: Coordination normal.  Alert and oriented X 2   Skin: Skin is warm and dry. No rash noted. No erythema.  Psychiatric: She has a normal mood and affect.    Labs reviewed: Recent Labs    09/04/16 11/06/16 01/19/17  NA 139 139 143  K 4.2 4.0 4.2  BUN 29* 39* 40*  CREATININE 1.3* 1.4* 1.5*   Recent Labs    05/29/16 11/06/16 01/19/17  AST 15 14 12*  ALT 15 13 20  ALKPHOS 57 58 53   Recent Labs    03/03/16 05/29/16 11/06/16  WBC 8.5 7.9 9.8  NEUTROABS  --   --  6,145  HGB 12.4 12.5 11.6*  HCT 38 38 34*  PLT 273 242 241   Lab Results  Component Value Date   TSH 3.96 11/10/2016   Lab Results  Component Value Date   HGBA1C 7.2 11/06/2016   Lab Results  Component Value Date   CHOL 166 09/15/2016   HDL 47 09/15/2016   LDLCALC 93 09/15/2016   TRIG 157 09/15/2016    Significant Diagnostic Results in last 30 days:  No results found.  Assessment/Plan  Type 2 diabetes mellitus with stage 3 chronic kidney disease, without long-term current use of insulin  CBG's in the AM 110's-160's; PM 90's- upper 200's.change metformin 500 mg tablet to one by mouth daily.On ACE inhibitor.Encourage NCS snacks and no sugar free candy.continue to monitor CBG   Family/ staff Communication: Reviewed plan of care with patient and  facility Nurse supervisor  Labs/tests ordered: None   Nelda Bucks Cleve Paolillo, NP

## 2017-01-23 DIAGNOSIS — M25531 Pain in right wrist: Secondary | ICD-10-CM | POA: Diagnosis not present

## 2017-01-23 DIAGNOSIS — R4789 Other speech disturbances: Secondary | ICD-10-CM | POA: Diagnosis not present

## 2017-01-23 DIAGNOSIS — G4700 Insomnia, unspecified: Secondary | ICD-10-CM | POA: Diagnosis not present

## 2017-01-23 DIAGNOSIS — G309 Alzheimer's disease, unspecified: Secondary | ICD-10-CM | POA: Diagnosis not present

## 2017-01-23 DIAGNOSIS — M6281 Muscle weakness (generalized): Secondary | ICD-10-CM | POA: Diagnosis not present

## 2017-01-23 DIAGNOSIS — I82502 Chronic embolism and thrombosis of unspecified deep veins of left lower extremity: Secondary | ICD-10-CM | POA: Diagnosis not present

## 2017-01-29 ENCOUNTER — Non-Acute Institutional Stay: Payer: Medicare Other | Admitting: Family

## 2017-01-29 ENCOUNTER — Encounter: Payer: Self-pay | Admitting: Family

## 2017-01-29 DIAGNOSIS — R634 Abnormal weight loss: Secondary | ICD-10-CM | POA: Diagnosis not present

## 2017-01-29 DIAGNOSIS — R5383 Other fatigue: Secondary | ICD-10-CM

## 2017-01-29 NOTE — Progress Notes (Signed)
Location:  [facility] Room Number: 17 Place of Service:  ALF [phone]) Provider: Keyon Winnick FNP-C  Blanchie Serve, MD  Patient Care Team: Blanchie Serve, MD as PCP - General (Internal Medicine) McKenzie, Lilia Argue, MD (Inactive) (Anesthesiology) Estill Dooms, MD as Consulting Physician (Internal Medicine) Kristeen Miss, MD as Consulting Physician (Neurosurgery) Shon Hough, MD as Consulting Physician (Ophthalmology) [name], [facility] ([place]) [name], MD as Consulting Physician (Dermatology) Fabiana Dromgoole, Nelda Bucks, NP as Nurse Practitioner (Family Medicine)  Extended Emergency Contact Information Primary Emergency Contact: [name] Address: [place], [place] Phone: [phone] Mobile Phone: [phone] Relation: Son  Code Status:  DNR Goals of care: Advanced Directive information Advanced Directives 01/29/2017  Does Patient Have a Medical Advance Directive? Yes  Type of Advance Directive Out of facility DNR (pink MOST or yellow form);Living will;Healthcare Power of Attorney  Does patient want to make changes to medical advance directive? -  Copy of [place] in Chart? Yes  Would patient like information on creating a medical advance directive? -  Pre-existing out of facility DNR order (yellow form or pink MOST form) Yellow form placed in chart (order not valid for inpatient use)     Chief Complaint  Patient presents with  . Acute Visit    Extreme fatigue-even at meal time    HPI:  Pt is a 84 y.o. female seen today at [facility] for an acute visit for evaluation of fatigue.she is seen in her room today with facility Nurse supervisor at bedside. Nurse reports dinning staff and Nurses reports patient has been extreme fatigue.Patient walks early to the main dinning but waits on the lobby and falls asleep.Staff also reports  patient falls asleep during meals. Patient has been up playing cards with other residents prior to visit.she denies any pain,fever,chills,fatigue or signs and symptoms of urinary tract infections. Her CBG reviewed morning readings in the 110's-160's while evening ranging 130's -low 200's. Progressive weight loss noted Wt 149.6 lbs ( 01/06/2017);wt 152 lbs (12/06/2017); 157.2 lbs (11/07/2016).    Past Medical History:  Diagnosis Date  . Allergic rhinitis   . Asthma   . Carpal tunnel syndrome 1980  . Chronic low back pain   . Constipation 06/27/2014  . GERD (gastroesophageal reflux disease)   . Hearing loss    Hearing aids  . Hyperglycemia 06/27/2014  . Hyperlipidemia 06/27/2014  . Hypertension   . Insomnia 06/27/2014  . Left leg DVT ([place])    chronic since 2008  . Memory loss 06/27/2014  . Meningioma ([place]) 2000   Dr. Ellene Route  . Osteopenia    Past Surgical History:  Procedure Laterality Date  . BACK SURGERY  1999   ruptured disk  . BRAIN TUMOR EXCISION  2000   Dr. Ellene Route  . CARPAL TUNNEL RELEASE  1980  . TEAR DUCT PROBING Bilateral 2014   [place]    No Known Allergies  Outpatient Encounter Medications as of 01/29/2017  Medication Sig  . acetaminophen (TYLENOL) 500 MG tablet Take 1,000 mg by mouth 2 (two) times daily. 8 AM and 7 PM  . acetaminophen (TYLENOL) 500 MG tablet Take 500 mg by mouth daily. 2 PM  . amLODipine-atorvastatin (CADUET) 5-10 MG tablet TAKE ONE TABLET DAILY FOR BLOOD PRESSURE  . docusate sodium (COLACE) 100 MG capsule Take 100 mg by mouth daily.   Marland Kitchen donepezil (  ARICEPT) 10 MG tablet Take 10 mg by mouth at bedtime.  . fluticasone (FLONASE) 50 MCG/ACT nasal spray Place 2 sprays into both nostrils daily as needed.   Marland Kitchen levothyroxine (SYNTHROID, LEVOTHROID) 50 MCG tablet Take 1 tablet (50 mcg total) by mouth daily before breakfast.  . lisinopril-hydrochlorothiazide (PRINZIDE,ZESTORETIC) 10-12.5 MG tablet TAKE 1 TABLET ONCE A DAY.  . memantine (NAMENDA) 10 MG tablet Take  10 mg by mouth 2 (two) times daily.  . metFORMIN (GLUCOPHAGE) 500 MG tablet Take 500 mg by mouth daily with breakfast.   . Multiple Vitamins-Minerals (ICAPS) CAPS Take 1 capsule by mouth 2 (two) times daily. For eyes   No facility-administered encounter medications on file as of 01/29/2017.     Review of Systems  Constitutional: Negative for appetite change, chills, fatigue and fever.  HENT: Negative for congestion, rhinorrhea, sinus pressure, sinus pain, sneezing and sore throat.   Eyes: Negative for discharge, redness and itching.  Respiratory: Negative for cough, chest tightness, shortness of breath and wheezing.   Cardiovascular: Negative for chest pain, palpitations and leg swelling.  Gastrointestinal: Negative for abdominal distention, abdominal pain, constipation, diarrhea, nausea and vomiting.  Endocrine: Negative for cold intolerance, heat intolerance, polydipsia, polyphagia and polyuria.  Genitourinary: Negative for dysuria, flank pain, frequency and urgency.       Incontinent reported  Musculoskeletal: Positive for gait problem. Negative for arthralgias and back pain.  Skin: Negative for color change, pallor, rash and wound.  Neurological: Negative for dizziness, syncope, light-headedness and headaches.       Fatigue reported by facility staff per HPI   Hematological: Does not bruise/bleed easily.  Psychiatric/Behavioral: Negative for agitation and sleep disturbance. The patient is not nervous/anxious.     Immunization History  Administered Date(s) Administered  . Influenza-Unspecified 10/20/2013, 10/05/2014, 10/18/2015, 10/15/2016  . PPD Test 10/10/2013, 03/23/2015, 04/10/2015  . Pneumococcal Polysaccharide-23 02/06/2009  . Td 10/11/2010  . Zoster 01/08/2012   Pertinent  Health Maintenance Due  Topic Date Due  . FOOT EXAM  10/07/1932  . OPHTHALMOLOGY EXAM  10/07/1932  . HEMOGLOBIN A1C  05/06/2017  . INFLUENZA VACCINE  Completed  . DEXA SCAN  Completed  . PNA vac Low  Risk Adult  Completed   Fall Risk  08/27/2016 06/12/2015 06/12/2015 03/13/2015 01/09/2015  Falls in the past year? No No No Yes No  Number falls in past yr: - - - 1 -  Injury with Fall? - - - No -    Vitals:   01/29/17 1039  BP: 138/75  Pulse: 68  Resp: 18  Temp: (!) 97.4 F (36.3 C)  SpO2: 96%  Weight: 149 lb 9.6 oz (67.9 kg)  Height: 5\' 1"  (1.549 m)   Body mass index is 28.27 kg/m. Physical Exam  Constitutional: She appears well-developed and well-nourished. No distress.  Elderly in no acute distress   HENT:  Head: Normocephalic.  Right Ear: External ear normal.  Left Ear: External ear normal.  Mouth/Throat: Oropharynx is clear and moist. No oropharyngeal exudate.  Eyes: Conjunctivae and EOM are normal. Pupils are equal, round, and reactive to light. Right eye exhibits no discharge. Left eye exhibits no discharge. No scleral icterus.  Neck: Normal range of motion. No JVD present. No thyromegaly present.  Cardiovascular: Normal rate, regular rhythm, normal heart sounds and intact distal pulses. Exam reveals no gallop and no friction rub.  No murmur heard. Pulmonary/Chest: Effort normal and breath sounds normal. No respiratory distress. She has no wheezes. She has no rales.  Abdominal:  Soft. Bowel sounds are normal. She exhibits no distension. There is no tenderness. There is no rebound and no guarding.  Musculoskeletal: She exhibits no edema or tenderness.  Unsteady gait uses walker   Lymphadenopathy:    She has no cervical adenopathy.  Neurological: Coordination normal.  Alert and oriented to person and place.  Skin: Skin is warm and dry. No rash noted. No erythema.  Psychiatric: She has a normal mood and affect.  Pleasantly confused at baseline.     Labs reviewed: Recent Labs    09/04/16 11/06/16 01/19/17  NA 139 139 143  K 4.2 4.0 4.2  BUN 29* 39* 40*  CREATININE 1.3* 1.4* 1.5*   Recent Labs    05/29/16 11/06/16 01/19/17  AST 15 14 12*  ALT 15 13 20   ALKPHOS 57 58  53   Recent Labs    03/03/16 05/29/16 11/06/16  WBC 8.5 7.9 9.8  NEUTROABS  --   --  6,145  HGB 12.4 12.5 11.6*  HCT 38 38 34*  PLT 273 242 241   Lab Results  Component Value Date   TSH 3.96 11/10/2016   Lab Results  Component Value Date   HGBA1C 7.2 11/06/2016   Lab Results  Component Value Date   CHOL 166 09/15/2016   HDL 47 09/15/2016   LDLCALC 93 09/15/2016   TRIG 157 09/15/2016    Significant Diagnostic Results in last 30 days:  No results found.  Assessment/Plan  Fatigue, unspecified type Fatigue and sleepiness reported by facility staff though patient denies symptoms.VSS.CBG readings stable no hypo/hyperglycemia.Progressive weight loss noted.Will obtain CBC/diff and CMP 02/02/2017 rule out infectious or metabolic causes.   Weight loss   Wt 149.6 lbs ( 01/06/2017) wt 152 lbs (12/06/2017) 157.2 lbs (11/07/2016)  Follow up with facility Registered Dietician.CMP 02/02/2017.    Family/ staff Communication: Reviewed plan of care with patient and facility Nurse supervisor  Labs/tests ordered: CBC/diff,CMP 02/02/2017   Sandrea Hughs, NP

## 2017-02-02 DIAGNOSIS — E785 Hyperlipidemia, unspecified: Secondary | ICD-10-CM | POA: Diagnosis not present

## 2017-02-02 DIAGNOSIS — I10 Essential (primary) hypertension: Secondary | ICD-10-CM | POA: Diagnosis not present

## 2017-02-02 DIAGNOSIS — R739 Hyperglycemia, unspecified: Secondary | ICD-10-CM | POA: Diagnosis not present

## 2017-02-05 DIAGNOSIS — M6281 Muscle weakness (generalized): Secondary | ICD-10-CM | POA: Diagnosis not present

## 2017-02-05 DIAGNOSIS — R4789 Other speech disturbances: Secondary | ICD-10-CM | POA: Diagnosis not present

## 2017-02-05 DIAGNOSIS — M25531 Pain in right wrist: Secondary | ICD-10-CM | POA: Diagnosis not present

## 2017-02-05 DIAGNOSIS — G309 Alzheimer's disease, unspecified: Secondary | ICD-10-CM | POA: Diagnosis not present

## 2017-02-05 DIAGNOSIS — I82502 Chronic embolism and thrombosis of unspecified deep veins of left lower extremity: Secondary | ICD-10-CM | POA: Diagnosis not present

## 2017-02-05 DIAGNOSIS — G4700 Insomnia, unspecified: Secondary | ICD-10-CM | POA: Diagnosis not present

## 2017-02-06 ENCOUNTER — Encounter: Payer: Self-pay | Admitting: Family

## 2017-02-06 DIAGNOSIS — G309 Alzheimer's disease, unspecified: Secondary | ICD-10-CM | POA: Diagnosis not present

## 2017-02-06 DIAGNOSIS — R4789 Other speech disturbances: Secondary | ICD-10-CM | POA: Diagnosis not present

## 2017-02-06 DIAGNOSIS — E785 Hyperlipidemia, unspecified: Secondary | ICD-10-CM | POA: Diagnosis not present

## 2017-02-06 DIAGNOSIS — M6281 Muscle weakness (generalized): Secondary | ICD-10-CM | POA: Diagnosis not present

## 2017-02-06 DIAGNOSIS — G4700 Insomnia, unspecified: Secondary | ICD-10-CM | POA: Diagnosis not present

## 2017-02-06 DIAGNOSIS — I82502 Chronic embolism and thrombosis of unspecified deep veins of left lower extremity: Secondary | ICD-10-CM | POA: Diagnosis not present

## 2017-02-06 NOTE — Progress Notes (Signed)
Opened in error

## 2017-02-10 DIAGNOSIS — R4789 Other speech disturbances: Secondary | ICD-10-CM | POA: Diagnosis not present

## 2017-02-10 DIAGNOSIS — G309 Alzheimer's disease, unspecified: Secondary | ICD-10-CM | POA: Diagnosis not present

## 2017-02-10 DIAGNOSIS — M6281 Muscle weakness (generalized): Secondary | ICD-10-CM | POA: Diagnosis not present

## 2017-02-10 DIAGNOSIS — I82502 Chronic embolism and thrombosis of unspecified deep veins of left lower extremity: Secondary | ICD-10-CM | POA: Diagnosis not present

## 2017-02-10 DIAGNOSIS — E785 Hyperlipidemia, unspecified: Secondary | ICD-10-CM | POA: Diagnosis not present

## 2017-02-10 DIAGNOSIS — G4700 Insomnia, unspecified: Secondary | ICD-10-CM | POA: Diagnosis not present

## 2017-02-12 DIAGNOSIS — G309 Alzheimer's disease, unspecified: Secondary | ICD-10-CM | POA: Diagnosis not present

## 2017-02-12 DIAGNOSIS — E785 Hyperlipidemia, unspecified: Secondary | ICD-10-CM | POA: Diagnosis not present

## 2017-02-12 DIAGNOSIS — M6281 Muscle weakness (generalized): Secondary | ICD-10-CM | POA: Diagnosis not present

## 2017-02-12 DIAGNOSIS — R4789 Other speech disturbances: Secondary | ICD-10-CM | POA: Diagnosis not present

## 2017-02-12 DIAGNOSIS — I82502 Chronic embolism and thrombosis of unspecified deep veins of left lower extremity: Secondary | ICD-10-CM | POA: Diagnosis not present

## 2017-02-12 DIAGNOSIS — G4700 Insomnia, unspecified: Secondary | ICD-10-CM | POA: Diagnosis not present

## 2017-02-16 DIAGNOSIS — G4700 Insomnia, unspecified: Secondary | ICD-10-CM | POA: Diagnosis not present

## 2017-02-16 DIAGNOSIS — R4789 Other speech disturbances: Secondary | ICD-10-CM | POA: Diagnosis not present

## 2017-02-16 DIAGNOSIS — E785 Hyperlipidemia, unspecified: Secondary | ICD-10-CM | POA: Diagnosis not present

## 2017-02-16 DIAGNOSIS — G309 Alzheimer's disease, unspecified: Secondary | ICD-10-CM | POA: Diagnosis not present

## 2017-02-16 DIAGNOSIS — I82502 Chronic embolism and thrombosis of unspecified deep veins of left lower extremity: Secondary | ICD-10-CM | POA: Diagnosis not present

## 2017-02-16 DIAGNOSIS — M6281 Muscle weakness (generalized): Secondary | ICD-10-CM | POA: Diagnosis not present

## 2017-02-17 ENCOUNTER — Non-Acute Institutional Stay: Payer: Medicare Other | Admitting: Family

## 2017-02-17 ENCOUNTER — Encounter: Payer: Self-pay | Admitting: Family

## 2017-02-17 DIAGNOSIS — G309 Alzheimer's disease, unspecified: Secondary | ICD-10-CM | POA: Diagnosis not present

## 2017-02-17 DIAGNOSIS — N183 Chronic kidney disease, stage 3 unspecified: Secondary | ICD-10-CM

## 2017-02-17 DIAGNOSIS — E1122 Type 2 diabetes mellitus with diabetic chronic kidney disease: Secondary | ICD-10-CM

## 2017-02-17 DIAGNOSIS — M6281 Muscle weakness (generalized): Secondary | ICD-10-CM | POA: Diagnosis not present

## 2017-02-17 DIAGNOSIS — I82502 Chronic embolism and thrombosis of unspecified deep veins of left lower extremity: Secondary | ICD-10-CM | POA: Diagnosis not present

## 2017-02-17 DIAGNOSIS — E785 Hyperlipidemia, unspecified: Secondary | ICD-10-CM | POA: Diagnosis not present

## 2017-02-17 DIAGNOSIS — G4700 Insomnia, unspecified: Secondary | ICD-10-CM | POA: Diagnosis not present

## 2017-02-17 DIAGNOSIS — R4789 Other speech disturbances: Secondary | ICD-10-CM | POA: Diagnosis not present

## 2017-02-17 NOTE — Progress Notes (Signed)
Location:  [facility] Room Number: 17  Place of Service:  ALF [phone]) Provider: Garin Mata FNP-C  Blanchie Serve, MD  Patient Care Team: Blanchie Serve, MD as PCP - General (Internal Medicine) McKenzie, Lilia Argue, MD (Inactive) (Anesthesiology) Estill Dooms, MD as Consulting Physician (Internal Medicine) Kristeen Miss, MD as Consulting Physician (Neurosurgery) Shon Hough, MD as Consulting Physician (Ophthalmology) [name], [facility] ([place] and [place]) [name], MD as Consulting Physician (Dermatology) Tacy Chavis, Nelda Bucks, NP as Nurse Practitioner (Family Medicine)  Extended Emergency Contact Information Primary Emergency Contact: [name] Address: [place], [place] of [place] Phone: [phone] Mobile Phone: [phone] Relation: Son  Code Status:  DNR Goals of care: Advanced Directive information Advanced Directives 02/06/2017  Does Patient Have a Medical Advance Directive? Yes  Type of Advance Directive Out of facility DNR (pink MOST or yellow form);[place];Living will  Does patient want to make changes to medical advance directive? -  Copy of [place] in Chart? Yes  Would patient like information on creating a medical advance directive? -  Pre-existing out of facility DNR order (yellow form or pink MOST form) Yellow form placed in chart (order not valid for inpatient use)     Chief Complaint  Patient presents with  . Acute Visit    blood sugar    HPI:  Pt is a 84 y.o. female seen today at [facility] for an acute visit for evaluation of blood sugars.she is seen in her room today per facility Nurse. Nurse reports patient's blood sugars have been stable wonders if CBG can be check twice daily instead of three times a day.Facility staff have removed candy from patient's room and family providing sugar free candy.CBG  readings have improved.CBG log reviewed morning CBG's readings in the 110's-150's while afternoon CBG's readings in the 120's-low 200's. She is currently on metformin 500 mg tablet daily.No signs or symptoms of hypo/hyperglycemia reported.    Past Medical History:  Diagnosis Date  . Allergic rhinitis   . Asthma   . Carpal tunnel syndrome 1980  . Chronic low back pain   . Constipation 06/27/2014  . GERD (gastroesophageal reflux disease)   . Hearing loss    Hearing aids  . Hyperglycemia 06/27/2014  . Hyperlipidemia 06/27/2014  . Hypertension   . Insomnia 06/27/2014  . Left leg DVT ([place])    chronic since 2008  . Memory loss 06/27/2014  . Meningioma ([place]) 2000   Dr. Ellene Route  . Osteopenia    Past Surgical History:  Procedure Laterality Date  . BACK SURGERY  1999   ruptured disk  . BRAIN TUMOR EXCISION  2000   Dr. Ellene Route  . CARPAL TUNNEL RELEASE  1980  . TEAR DUCT PROBING Bilateral 2014   [place]    No Known Allergies  Outpatient Encounter Medications as of 02/17/2017  Medication Sig  . acetaminophen (TYLENOL) 325 MG tablet Take 650 mg by mouth every 4 (four) hours as needed for mild pain or headache.  Marland Kitchen amLODipine-atorvastatin (CADUET) 5-10 MG tablet TAKE ONE TABLET DAILY FOR BLOOD PRESSURE  . docusate sodium (COLACE) 100 MG capsule Take 100 mg by mouth daily.   Marland Kitchen donepezil (ARICEPT) 10 MG tablet Take 10 mg by mouth at bedtime.  . fluticasone (FLONASE) 50 MCG/ACT nasal spray Place 2 sprays into both nostrils daily as needed.   Marland Kitchen levothyroxine (SYNTHROID,  LEVOTHROID) 50 MCG tablet Take 1 tablet (50 mcg total) by mouth daily before breakfast.  . lisinopril-hydrochlorothiazide (PRINZIDE,ZESTORETIC) 10-12.5 MG tablet TAKE 1 TABLET ONCE A DAY.  . memantine (NAMENDA) 10 MG tablet Take 10 mg by mouth 2 (two) times daily.  . metFORMIN (GLUCOPHAGE) 500 MG tablet Take 500 mg by mouth daily with breakfast.   . Multiple Vitamins-Minerals (ICAPS) CAPS Take 1 capsule by mouth 2 (two) times  daily. For eyes   No facility-administered encounter medications on file as of 02/17/2017.     Review of Systems  Constitutional: Negative for appetite change, chills, fatigue and fever.  Eyes: Negative for visual disturbance.  Respiratory: Negative for cough, chest tightness, shortness of breath and wheezing.   Cardiovascular: Negative for chest pain, palpitations and leg swelling.  Gastrointestinal: Negative for abdominal distention, abdominal pain, constipation, diarrhea, nausea and vomiting.  Endocrine: Negative for polydipsia, polyphagia and polyuria.  Genitourinary: Negative for frequency and urgency.  Musculoskeletal: Positive for gait problem.  Skin: Negative for color change, pallor and rash.  Neurological: Negative for dizziness, syncope, light-headedness and headaches.  Psychiatric/Behavioral: Negative for agitation, confusion and sleep disturbance. The patient is not nervous/anxious.     Immunization History  Administered Date(s) Administered  . Influenza-Unspecified 10/20/2013, 10/05/2014, 10/18/2015, 10/15/2016  . PPD Test 10/10/2013, 03/23/2015, 04/10/2015  . Pneumococcal Polysaccharide-23 02/06/2009  . Td 10/11/2010  . Zoster 01/08/2012   Pertinent  Health Maintenance Due  Topic Date Due  . FOOT EXAM  10/07/1932  . OPHTHALMOLOGY EXAM  10/07/1932  . HEMOGLOBIN A1C  05/06/2017  . INFLUENZA VACCINE  Completed  . DEXA SCAN  Completed  . PNA vac Low Risk Adult  Completed   Fall Risk  08/27/2016 06/12/2015 06/12/2015 03/13/2015 01/09/2015  Falls in the past year? No No No Yes No  Number falls in past yr: - - - 1 -  Injury with Fall? - - - No -    Vitals:   02/17/17 1443  BP: (!) 144/80  Pulse: 68  Resp: 20  Temp: 97.7 F (36.5 C)  SpO2: 97%  Weight: 143 lb 6.4 oz (65 kg)  Height: 5\' 1"  (1.549 m)   Body mass index is 27.1 kg/m. Physical Exam  Constitutional: She appears well-developed and well-nourished.  Elderly in no acute distress   HENT:  Head:  Normocephalic.  Mouth/Throat: Oropharynx is clear and moist. No oropharyngeal exudate.  Missing upper canine teeth   Eyes: Conjunctivae and EOM are normal. Pupils are equal, round, and reactive to light. Right eye exhibits no discharge. Left eye exhibits no discharge.  Eye glasses in place   Neck: Normal range of motion. No JVD present. No thyromegaly present.  Cardiovascular: Normal rate, regular rhythm, normal heart sounds and intact distal pulses. Exam reveals no gallop and no friction rub.  No murmur heard. Pulmonary/Chest: Effort normal and breath sounds normal. No respiratory distress. She has no wheezes. She has no rales.  Abdominal: Soft. Bowel sounds are normal. She exhibits no distension. There is no tenderness. There is no rebound and no guarding.  Musculoskeletal: She exhibits no edema or tenderness.  Moves x 4 extremities.unsteady gait uses FWW  Lymphadenopathy:    She has no cervical adenopathy.  Neurological: Coordination normal.  Alert and oriented to person and place  Skin: Skin is warm and dry. No rash noted. No erythema. No pallor.  Psychiatric: She has a normal mood and affect. Her behavior is normal.   Labs reviewed: Recent Labs    09/04/16 11/06/16  01/19/17  NA 139 139 143  K 4.2 4.0 4.2  BUN 29* 39* 40*  CREATININE 1.3* 1.4* 1.5*   Recent Labs    05/29/16 11/06/16 01/19/17  AST 15 14 12*  ALT 15 13 20   ALKPHOS 57 58 53   Recent Labs    03/03/16 05/29/16 11/06/16  WBC 8.5 7.9 9.8  NEUTROABS  --   --  6,145  HGB 12.4 12.5 11.6*  HCT 38 38 34*  PLT 273 242 241   Lab Results  Component Value Date   TSH 3.96 11/10/2016   Lab Results  Component Value Date   HGBA1C 7.2 11/06/2016   Lab Results  Component Value Date   CHOL 166 09/15/2016   HDL 47 09/15/2016   LDLCALC 93 09/15/2016   TRIG 157 09/15/2016    Significant Diagnostic Results in last 30 days:  No results found.  Assessment/Plan   Type 2 diabetes mellitus with stage 3 chronic  kidney disease, without long-term current use of insulin  Facility staff have removed candy from patient's room and family providing sugar free candy.CBG readings have improved.CBG log reviewed morning CBG's readings in the 110's-150's while afternoon CBG's readings in the 120's-low 200's.continue on metformin 500 mg tablet daily. Check CBG twice daily if remains stable will change to once daily.continue to monitor.  Family/ staff Communication: Reviewed plan of care with patient and facility Nurse.   Labs/tests ordered: None   Finlay Godbee C Torii Royse, NP

## 2017-02-19 DIAGNOSIS — I82502 Chronic embolism and thrombosis of unspecified deep veins of left lower extremity: Secondary | ICD-10-CM | POA: Diagnosis not present

## 2017-02-19 DIAGNOSIS — M6281 Muscle weakness (generalized): Secondary | ICD-10-CM | POA: Diagnosis not present

## 2017-02-19 DIAGNOSIS — G4700 Insomnia, unspecified: Secondary | ICD-10-CM | POA: Diagnosis not present

## 2017-02-19 DIAGNOSIS — E785 Hyperlipidemia, unspecified: Secondary | ICD-10-CM | POA: Diagnosis not present

## 2017-02-19 DIAGNOSIS — R4789 Other speech disturbances: Secondary | ICD-10-CM | POA: Diagnosis not present

## 2017-02-19 DIAGNOSIS — G309 Alzheimer's disease, unspecified: Secondary | ICD-10-CM | POA: Diagnosis not present

## 2017-02-20 DIAGNOSIS — I82502 Chronic embolism and thrombosis of unspecified deep veins of left lower extremity: Secondary | ICD-10-CM | POA: Diagnosis not present

## 2017-02-20 DIAGNOSIS — R4789 Other speech disturbances: Secondary | ICD-10-CM | POA: Diagnosis not present

## 2017-02-20 DIAGNOSIS — M6281 Muscle weakness (generalized): Secondary | ICD-10-CM | POA: Diagnosis not present

## 2017-02-20 DIAGNOSIS — E785 Hyperlipidemia, unspecified: Secondary | ICD-10-CM | POA: Diagnosis not present

## 2017-02-20 DIAGNOSIS — G4700 Insomnia, unspecified: Secondary | ICD-10-CM | POA: Diagnosis not present

## 2017-02-20 DIAGNOSIS — G309 Alzheimer's disease, unspecified: Secondary | ICD-10-CM | POA: Diagnosis not present

## 2017-02-23 DIAGNOSIS — G309 Alzheimer's disease, unspecified: Secondary | ICD-10-CM | POA: Diagnosis not present

## 2017-02-23 DIAGNOSIS — E785 Hyperlipidemia, unspecified: Secondary | ICD-10-CM | POA: Diagnosis not present

## 2017-02-23 DIAGNOSIS — G4700 Insomnia, unspecified: Secondary | ICD-10-CM | POA: Diagnosis not present

## 2017-02-23 DIAGNOSIS — I82502 Chronic embolism and thrombosis of unspecified deep veins of left lower extremity: Secondary | ICD-10-CM | POA: Diagnosis not present

## 2017-02-23 DIAGNOSIS — R4789 Other speech disturbances: Secondary | ICD-10-CM | POA: Diagnosis not present

## 2017-02-23 DIAGNOSIS — M6281 Muscle weakness (generalized): Secondary | ICD-10-CM | POA: Diagnosis not present

## 2017-02-25 DIAGNOSIS — G4700 Insomnia, unspecified: Secondary | ICD-10-CM | POA: Diagnosis not present

## 2017-02-25 DIAGNOSIS — E785 Hyperlipidemia, unspecified: Secondary | ICD-10-CM | POA: Diagnosis not present

## 2017-02-25 DIAGNOSIS — M6281 Muscle weakness (generalized): Secondary | ICD-10-CM | POA: Diagnosis not present

## 2017-02-25 DIAGNOSIS — R4789 Other speech disturbances: Secondary | ICD-10-CM | POA: Diagnosis not present

## 2017-02-25 DIAGNOSIS — I82502 Chronic embolism and thrombosis of unspecified deep veins of left lower extremity: Secondary | ICD-10-CM | POA: Diagnosis not present

## 2017-02-25 DIAGNOSIS — G309 Alzheimer's disease, unspecified: Secondary | ICD-10-CM | POA: Diagnosis not present

## 2017-02-27 DIAGNOSIS — E785 Hyperlipidemia, unspecified: Secondary | ICD-10-CM | POA: Diagnosis not present

## 2017-02-27 DIAGNOSIS — G309 Alzheimer's disease, unspecified: Secondary | ICD-10-CM | POA: Diagnosis not present

## 2017-02-27 DIAGNOSIS — I82502 Chronic embolism and thrombosis of unspecified deep veins of left lower extremity: Secondary | ICD-10-CM | POA: Diagnosis not present

## 2017-02-27 DIAGNOSIS — M6281 Muscle weakness (generalized): Secondary | ICD-10-CM | POA: Diagnosis not present

## 2017-02-27 DIAGNOSIS — G4700 Insomnia, unspecified: Secondary | ICD-10-CM | POA: Diagnosis not present

## 2017-02-27 DIAGNOSIS — R4789 Other speech disturbances: Secondary | ICD-10-CM | POA: Diagnosis not present

## 2017-03-02 DIAGNOSIS — G309 Alzheimer's disease, unspecified: Secondary | ICD-10-CM | POA: Diagnosis not present

## 2017-03-02 DIAGNOSIS — I82502 Chronic embolism and thrombosis of unspecified deep veins of left lower extremity: Secondary | ICD-10-CM | POA: Diagnosis not present

## 2017-03-02 DIAGNOSIS — E785 Hyperlipidemia, unspecified: Secondary | ICD-10-CM | POA: Diagnosis not present

## 2017-03-02 DIAGNOSIS — G4700 Insomnia, unspecified: Secondary | ICD-10-CM | POA: Diagnosis not present

## 2017-03-02 DIAGNOSIS — M6281 Muscle weakness (generalized): Secondary | ICD-10-CM | POA: Diagnosis not present

## 2017-03-02 DIAGNOSIS — R4789 Other speech disturbances: Secondary | ICD-10-CM | POA: Diagnosis not present

## 2017-03-03 DIAGNOSIS — R4789 Other speech disturbances: Secondary | ICD-10-CM | POA: Diagnosis not present

## 2017-03-03 DIAGNOSIS — E785 Hyperlipidemia, unspecified: Secondary | ICD-10-CM | POA: Diagnosis not present

## 2017-03-03 DIAGNOSIS — G4700 Insomnia, unspecified: Secondary | ICD-10-CM | POA: Diagnosis not present

## 2017-03-03 DIAGNOSIS — G309 Alzheimer's disease, unspecified: Secondary | ICD-10-CM | POA: Diagnosis not present

## 2017-03-03 DIAGNOSIS — M6281 Muscle weakness (generalized): Secondary | ICD-10-CM | POA: Diagnosis not present

## 2017-03-03 DIAGNOSIS — I82502 Chronic embolism and thrombosis of unspecified deep veins of left lower extremity: Secondary | ICD-10-CM | POA: Diagnosis not present

## 2017-03-05 DIAGNOSIS — E785 Hyperlipidemia, unspecified: Secondary | ICD-10-CM | POA: Diagnosis not present

## 2017-03-05 DIAGNOSIS — I10 Essential (primary) hypertension: Secondary | ICD-10-CM | POA: Diagnosis not present

## 2017-03-05 LAB — COMPLETE METABOLIC PANEL WITH GFR
ALK PHOS: 60
ALT: 12
AST: 13
Albumin: 3.8
BILIRUBIN TOTAL: 0.4
BUN: 29 — AB (ref 4–21)
CALCIUM: 9.4
Creat: 1.32
GLUCOSE: 153
Potassium: 4.2
Sodium: 139
Total Protein: 6.1 g/dL

## 2017-03-05 LAB — HEPATIC FUNCTION PANEL
ALK PHOS: 60 (ref 25–125)
ALT: 12 (ref 7–35)
AST: 13 (ref 13–35)
Bilirubin, Total: 0.4

## 2017-03-05 LAB — HEMOGLOBIN A1C
A1C: 6.9
Hemoglobin A1C: 6.9

## 2017-03-05 LAB — BASIC METABOLIC PANEL
Creatinine: 1.3 — AB (ref 0.5–1.1)
Glucose: 153
Potassium: 4.2 (ref 3.4–5.3)
Sodium: 139 (ref 137–147)

## 2017-03-05 LAB — HEMOGLOBIN: HEMOGLOBIN: 11

## 2017-03-06 ENCOUNTER — Encounter: Payer: Self-pay | Admitting: *Deleted

## 2017-03-12 DIAGNOSIS — R2681 Unsteadiness on feet: Secondary | ICD-10-CM | POA: Diagnosis not present

## 2017-03-12 DIAGNOSIS — R1311 Dysphagia, oral phase: Secondary | ICD-10-CM | POA: Diagnosis not present

## 2017-03-12 DIAGNOSIS — K219 Gastro-esophageal reflux disease without esophagitis: Secondary | ICD-10-CM | POA: Diagnosis not present

## 2017-03-12 DIAGNOSIS — G309 Alzheimer's disease, unspecified: Secondary | ICD-10-CM | POA: Diagnosis not present

## 2017-03-12 DIAGNOSIS — E785 Hyperlipidemia, unspecified: Secondary | ICD-10-CM | POA: Diagnosis not present

## 2017-03-12 DIAGNOSIS — I10 Essential (primary) hypertension: Secondary | ICD-10-CM | POA: Diagnosis not present

## 2017-03-12 DIAGNOSIS — M6281 Muscle weakness (generalized): Secondary | ICD-10-CM | POA: Diagnosis not present

## 2017-03-16 DIAGNOSIS — R2681 Unsteadiness on feet: Secondary | ICD-10-CM | POA: Diagnosis not present

## 2017-03-16 DIAGNOSIS — E785 Hyperlipidemia, unspecified: Secondary | ICD-10-CM | POA: Diagnosis not present

## 2017-03-16 DIAGNOSIS — I10 Essential (primary) hypertension: Secondary | ICD-10-CM | POA: Diagnosis not present

## 2017-03-16 DIAGNOSIS — R1311 Dysphagia, oral phase: Secondary | ICD-10-CM | POA: Diagnosis not present

## 2017-03-16 DIAGNOSIS — G309 Alzheimer's disease, unspecified: Secondary | ICD-10-CM | POA: Diagnosis not present

## 2017-03-16 DIAGNOSIS — K219 Gastro-esophageal reflux disease without esophagitis: Secondary | ICD-10-CM | POA: Diagnosis not present

## 2017-03-17 DIAGNOSIS — R2681 Unsteadiness on feet: Secondary | ICD-10-CM | POA: Diagnosis not present

## 2017-03-17 DIAGNOSIS — K219 Gastro-esophageal reflux disease without esophagitis: Secondary | ICD-10-CM | POA: Diagnosis not present

## 2017-03-17 DIAGNOSIS — R1311 Dysphagia, oral phase: Secondary | ICD-10-CM | POA: Diagnosis not present

## 2017-03-17 DIAGNOSIS — I10 Essential (primary) hypertension: Secondary | ICD-10-CM | POA: Diagnosis not present

## 2017-03-17 DIAGNOSIS — E785 Hyperlipidemia, unspecified: Secondary | ICD-10-CM | POA: Diagnosis not present

## 2017-03-17 DIAGNOSIS — G309 Alzheimer's disease, unspecified: Secondary | ICD-10-CM | POA: Diagnosis not present

## 2017-03-18 ENCOUNTER — Non-Acute Institutional Stay: Payer: Medicare Other | Admitting: Internal Medicine

## 2017-03-18 ENCOUNTER — Encounter: Payer: Self-pay | Admitting: Internal Medicine

## 2017-03-18 DIAGNOSIS — E1122 Type 2 diabetes mellitus with diabetic chronic kidney disease: Secondary | ICD-10-CM

## 2017-03-18 DIAGNOSIS — E785 Hyperlipidemia, unspecified: Secondary | ICD-10-CM | POA: Diagnosis not present

## 2017-03-18 DIAGNOSIS — R1310 Dysphagia, unspecified: Secondary | ICD-10-CM | POA: Diagnosis not present

## 2017-03-18 DIAGNOSIS — I129 Hypertensive chronic kidney disease with stage 1 through stage 4 chronic kidney disease, or unspecified chronic kidney disease: Secondary | ICD-10-CM

## 2017-03-18 DIAGNOSIS — Z794 Long term (current) use of insulin: Secondary | ICD-10-CM

## 2017-03-18 DIAGNOSIS — E440 Moderate protein-calorie malnutrition: Secondary | ICD-10-CM | POA: Diagnosis not present

## 2017-03-18 DIAGNOSIS — E039 Hypothyroidism, unspecified: Secondary | ICD-10-CM | POA: Diagnosis not present

## 2017-03-18 DIAGNOSIS — K5909 Other constipation: Secondary | ICD-10-CM | POA: Diagnosis not present

## 2017-03-18 DIAGNOSIS — G309 Alzheimer's disease, unspecified: Secondary | ICD-10-CM | POA: Diagnosis not present

## 2017-03-18 DIAGNOSIS — N184 Chronic kidney disease, stage 4 (severe): Secondary | ICD-10-CM | POA: Diagnosis not present

## 2017-03-18 DIAGNOSIS — F0280 Dementia in other diseases classified elsewhere without behavioral disturbance: Secondary | ICD-10-CM

## 2017-03-18 DIAGNOSIS — R1311 Dysphagia, oral phase: Secondary | ICD-10-CM | POA: Diagnosis not present

## 2017-03-18 DIAGNOSIS — R2681 Unsteadiness on feet: Secondary | ICD-10-CM | POA: Diagnosis not present

## 2017-03-18 DIAGNOSIS — K219 Gastro-esophageal reflux disease without esophagitis: Secondary | ICD-10-CM | POA: Diagnosis not present

## 2017-03-18 DIAGNOSIS — I10 Essential (primary) hypertension: Secondary | ICD-10-CM | POA: Diagnosis not present

## 2017-03-18 LAB — CMP 10231
ALBUMIN: 3.8
CHLORIDE: 103
CO2: 27
Calcium: 9.4
Globulin: 2.3
Total Protein: 6.1

## 2017-03-18 NOTE — Progress Notes (Signed)
Location:  [facility] Room Number: 17 Place of Service:  ALF [phone]) Provider:  Blanchie Serve MD  Blanchie Serve, MD  Patient Care Team: Blanchie Serve, MD as PCP - General (Internal Medicine) McKenzie, Lilia Argue, MD (Inactive) (Anesthesiology) Estill Dooms, MD as Consulting Physician (Internal Medicine) Kristeen Miss, MD as Consulting Physician (Neurosurgery) Shon Hough, MD as Consulting Physician (Ophthalmology) [name], [facility] ([place]) [name], MD as Consulting Physician (Dermatology) Ngetich, Nelda Bucks, NP as Nurse Practitioner (Family Medicine)  Extended Emergency Contact Information Primary Emergency Contact: [name] Address: [place], [place] Phone: [phone] Mobile Phone: [phone] Relation: Son  Code Status:  DNR  Goals of care: Advanced Directive information Advanced Directives 03/18/2017  Does Patient Have a Medical Advance Directive? Yes  Type of Paramedic of [place];Living will;Out of facility DNR (pink MOST or yellow form)  Does patient want to make changes to medical advance directive? No - Patient declined  Copy of [place] in Chart? Yes  Would patient like information on creating a medical advance directive? -  Pre-existing out of facility DNR order (yellow form or pink MOST form) Yellow form placed in chart (order not valid for inpatient use)     Chief Complaint  Patient presents with  . Medical Management of Chronic Issues    Routine Visit     HPI:  Pt is a 84 y.o. female seen today for medical management of chronic diseases.  Nursing is concerned about her needing more cueing with meals and her falling asleep during meals for 3-4 days. She is noted to close her eyes during conversation per nursing. Nurse present at bedside during this visit. Patient has history of  dementia and is on medication. She also has history of hypothyroidism and takes levothyroxine. Reviewed blood sugar with her diabetes and morning blood sugar are between 100-124. Evening blood sugar between 120-196.    Past Medical History:  Diagnosis Date  . Allergic rhinitis   . Asthma   . Carpal tunnel syndrome 1980  . Chronic low back pain   . Constipation 06/27/2014  . GERD (gastroesophageal reflux disease)   . Hearing loss    Hearing aids  . Hyperglycemia 06/27/2014  . Hyperlipidemia 06/27/2014  . Hypertension   . Insomnia 06/27/2014  . Left leg DVT ([place])    chronic since 2008  . Memory loss 06/27/2014  . Meningioma ([place]) 2000   Dr. Ellene Route  . Osteopenia    Past Surgical History:  Procedure Laterality Date  . BACK SURGERY  1999   ruptured disk  . BRAIN TUMOR EXCISION  2000   Dr. Ellene Route  . CARPAL TUNNEL RELEASE  1980  . TEAR DUCT PROBING Bilateral 2014   Saluidon    No Known Allergies  Outpatient Encounter Medications as of 03/18/2017  Medication Sig  . amLODipine-atorvastatin (CADUET) 5-10 MG tablet TAKE ONE TABLET DAILY FOR BLOOD PRESSURE  . docusate sodium (COLACE) 100 MG capsule Take 100 mg by mouth daily.   Marland Kitchen donepezil (ARICEPT) 10 MG tablet Take 10 mg by mouth at bedtime.  . fluticasone (FLONASE) 50 MCG/ACT nasal spray Place 2 sprays into both nostrils daily as needed.   Marland Kitchen levothyroxine (SYNTHROID, LEVOTHROID) 50 MCG tablet Take 1 tablet (50 mcg total) by mouth daily before breakfast.  . lisinopril-hydrochlorothiazide (PRINZIDE,ZESTORETIC) 10-12.5 MG tablet TAKE 1 TABLET ONCE  A DAY.  . memantine (NAMENDA) 10 MG tablet Take 10 mg by mouth 2 (two) times daily.  . metFORMIN (GLUCOPHAGE) 500 MG tablet Take 500 mg by mouth daily with breakfast.   . Multiple Vitamins-Minerals (ICAPS) CAPS Take 1 capsule by mouth 2 (two) times daily. For eyes  . [DISCONTINUED] acetaminophen (TYLENOL) 325 MG tablet Take 650 mg by mouth every 4 (four) hours as needed for mild pain or  headache.   No facility-administered encounter medications on file as of 03/18/2017.    Review of Systems  Constitutional: Negative for appetite change, chills, fatigue and fever.  HENT: Positive for hearing loss. Negative for congestion, ear pain, mouth sores, postnasal drip, sore throat and trouble swallowing.   Eyes: Negative for visual disturbance.  Respiratory: Negative for cough, shortness of breath and wheezing.   Cardiovascular: Negative for chest pain, palpitations and leg swelling.  Gastrointestinal: Negative for abdominal pain, blood in stool, diarrhea, nausea and vomiting.  Genitourinary: Negative for dysuria.       Has urinary incontinence at times  Musculoskeletal: Positive for gait problem. Negative for back pain.  Skin: Negative for rash.  Neurological: Negative for dizziness, syncope and headaches.  Psychiatric/Behavioral: Positive for confusion. Negative for behavioral problems.    Immunization History  Administered Date(s) Administered  . Influenza-Unspecified 10/20/2013, 10/05/2014, 10/18/2015, 10/15/2016  . PPD Test 10/10/2013, 03/23/2015, 04/10/2015  . Pneumococcal Polysaccharide-23 02/06/2009  . Td 10/11/2010  . Zoster 01/08/2012   Pertinent  Health Maintenance Due  Topic Date Due  . FOOT EXAM  10/07/1932  . OPHTHALMOLOGY EXAM  10/07/1932  . HEMOGLOBIN A1C  09/02/2017  . INFLUENZA VACCINE  Completed  . DEXA SCAN  Completed  . PNA vac Low Risk Adult  Completed   Fall Risk  08/27/2016 06/12/2015 06/12/2015 03/13/2015 01/09/2015  Falls in the past year? No No No Yes No  Number falls in past yr: - - - 1 -  Injury with Fall? - - - No -   Functional Status Survey:    Vitals:   03/18/17 1512  BP: (!) 144/52  Pulse: 68  Resp: 20  Temp: 98.4 F (36.9 C)  TempSrc: Oral  SpO2: 93%  Weight: 144 lb 6.4 oz (65.5 kg)  Height: 5\' 5"  (1.651 m)   Body mass index is 24.03 kg/m.   Physical Exam  Constitutional:  Frail, elderly female patient in no distress    HENT:  Head: Normocephalic and atraumatic.  Right Ear: External ear normal.  Left Ear: External ear normal.  Nose: Nose normal.  Mouth/Throat: Oropharynx is clear and moist. No oropharyngeal exudate.  Eyes: Conjunctivae and EOM are normal. Pupils are equal, round, and reactive to light. Right eye exhibits no discharge. Left eye exhibits no discharge.  Neck: Normal range of motion. Neck supple.  Cardiovascular: Normal rate, regular rhythm and intact distal pulses.  Pulmonary/Chest: Effort normal and breath sounds normal. She has no wheezes. She has no rales.  Abdominal: Soft. Bowel sounds are normal. There is no tenderness. There is no guarding.  Musculoskeletal: She exhibits no edema or tenderness.  Unsteady gait, uses walker to ambulate, able to move all 4 extremities  Lymphadenopathy:    She has no cervical adenopathy.  Neurological: She is alert.  Oriented to person and place but not to time  Skin: Skin is warm and dry. No rash noted. She is not diaphoretic.  Psychiatric: She has a normal mood and affect.   Labs reviewed: Recent Labs    11/06/16 01/19/17 03/05/17  NA 139 143 139  139  K 4.0 4.2 4.2  4.2  CL  --   --  103  CO2  --   --  27  BUN 39* 40* 29*  CREATININE 1.4* 1.5* 1.3*  1.32  CALCIUM  --   --  9.4  9.4   Recent Labs    01/19/17 03/05/17  AST 12* 13  13  ALT 20 12  12   ALKPHOS 53 60  60  BILITOT  --  0.4  PROT  --  6.1  6.1  ALBUMIN  --  3.8  3.8   Recent Labs    05/29/16 11/06/16 03/05/17  WBC 7.9 9.8  --   NEUTROABS  --  6,145  --   HGB 12.5 11.6* 11.0  HCT 38 34*  --   PLT 242 241  --    Lab Results  Component Value Date   TSH 3.96 11/10/2016   Lab Results  Component Value Date   HGBA1C 6.9 03/05/2017   Lab Results  Component Value Date   CHOL 166 09/15/2016   HDL 47 09/15/2016   LDLCALC 93 09/15/2016   TRIG 157 09/15/2016    Significant Diagnostic Results in last 30 days:  No results  found.  Assessment/Plan  Dysphagia Aspiration precautions, SLP to follow. Mechanical soft diet for now.   Hypertension with renal disease BP stable on review. Continue amlodipine 5 mg daily, lisinopril-hctz 10-12.5 mg daily. Monitor BMP. Check BP for now. Consider switching her to another antihypertensive given her renal impairment.   Hyperlipidemia With LDL goal <70 with history of DM. Currently on atorvastatin 10 mg daily. Check lipid panel.  Diabetes type 2 with ckd stage 4 Reviewed a1c and blood sugar suggestive of controlled diabetes. Given her advanced age and renal disease, a1c at goal of <7.5. D/c metformin for now. Check CBG daily in am x 1 month. If a1c>7.5, consider insulin.   Chronic constipation Continue colace 100 mg daily  Alzheimer's dementia With her creatinine clearance <30, decrease memantine to 5 mg bid. Continue donepezil but decrease to 5 mg daily with her weight loss. Supportive care  hypothyroidism Check TSH. Continue levothyroxine 50 mcg daily.   Protein calorie malnutrition Has been losing weight. With her dementia, decline anticipated. Encourage po intake, dietary team on board.     Family/ staff Communication: reviewed care plan with patient and charge nurse.    Labs/tests ordered:  TSH 03/19/17, BMP with GFR 03/30/17   Blanchie Serve, MD Internal Medicine [facility] [address] [place], [place] [address] Cell Phone (Monday-Friday 8 am - 5 pm): [phone] On Call: [phone] and follow prompts after 5 pm and on weekends Office Phone: [phone] Office Fax: [phone]

## 2017-03-19 DIAGNOSIS — R2681 Unsteadiness on feet: Secondary | ICD-10-CM | POA: Diagnosis not present

## 2017-03-19 DIAGNOSIS — E785 Hyperlipidemia, unspecified: Secondary | ICD-10-CM | POA: Diagnosis not present

## 2017-03-19 DIAGNOSIS — R1311 Dysphagia, oral phase: Secondary | ICD-10-CM | POA: Diagnosis not present

## 2017-03-19 DIAGNOSIS — K219 Gastro-esophageal reflux disease without esophagitis: Secondary | ICD-10-CM | POA: Diagnosis not present

## 2017-03-19 DIAGNOSIS — I10 Essential (primary) hypertension: Secondary | ICD-10-CM | POA: Diagnosis not present

## 2017-03-19 DIAGNOSIS — G309 Alzheimer's disease, unspecified: Secondary | ICD-10-CM | POA: Diagnosis not present

## 2017-03-19 LAB — TSH: TSH: 4.31

## 2017-03-20 ENCOUNTER — Encounter: Payer: Self-pay | Admitting: *Deleted

## 2017-03-20 DIAGNOSIS — R1311 Dysphagia, oral phase: Secondary | ICD-10-CM | POA: Diagnosis not present

## 2017-03-20 DIAGNOSIS — K219 Gastro-esophageal reflux disease without esophagitis: Secondary | ICD-10-CM | POA: Diagnosis not present

## 2017-03-20 DIAGNOSIS — R2681 Unsteadiness on feet: Secondary | ICD-10-CM | POA: Diagnosis not present

## 2017-03-20 DIAGNOSIS — E785 Hyperlipidemia, unspecified: Secondary | ICD-10-CM | POA: Diagnosis not present

## 2017-03-20 DIAGNOSIS — I10 Essential (primary) hypertension: Secondary | ICD-10-CM | POA: Diagnosis not present

## 2017-03-20 DIAGNOSIS — G309 Alzheimer's disease, unspecified: Secondary | ICD-10-CM | POA: Diagnosis not present

## 2017-03-23 DIAGNOSIS — G309 Alzheimer's disease, unspecified: Secondary | ICD-10-CM | POA: Diagnosis not present

## 2017-03-23 DIAGNOSIS — E785 Hyperlipidemia, unspecified: Secondary | ICD-10-CM | POA: Diagnosis not present

## 2017-03-23 DIAGNOSIS — R1311 Dysphagia, oral phase: Secondary | ICD-10-CM | POA: Diagnosis not present

## 2017-03-23 DIAGNOSIS — I10 Essential (primary) hypertension: Secondary | ICD-10-CM | POA: Diagnosis not present

## 2017-03-23 DIAGNOSIS — K219 Gastro-esophageal reflux disease without esophagitis: Secondary | ICD-10-CM | POA: Diagnosis not present

## 2017-03-23 DIAGNOSIS — R2681 Unsteadiness on feet: Secondary | ICD-10-CM | POA: Diagnosis not present

## 2017-03-24 DIAGNOSIS — R1311 Dysphagia, oral phase: Secondary | ICD-10-CM | POA: Diagnosis not present

## 2017-03-24 DIAGNOSIS — I10 Essential (primary) hypertension: Secondary | ICD-10-CM | POA: Diagnosis not present

## 2017-03-24 DIAGNOSIS — E785 Hyperlipidemia, unspecified: Secondary | ICD-10-CM | POA: Diagnosis not present

## 2017-03-24 DIAGNOSIS — R2681 Unsteadiness on feet: Secondary | ICD-10-CM | POA: Diagnosis not present

## 2017-03-24 DIAGNOSIS — K219 Gastro-esophageal reflux disease without esophagitis: Secondary | ICD-10-CM | POA: Diagnosis not present

## 2017-03-24 DIAGNOSIS — G309 Alzheimer's disease, unspecified: Secondary | ICD-10-CM | POA: Diagnosis not present

## 2017-03-25 DIAGNOSIS — G309 Alzheimer's disease, unspecified: Secondary | ICD-10-CM | POA: Diagnosis not present

## 2017-03-25 DIAGNOSIS — K219 Gastro-esophageal reflux disease without esophagitis: Secondary | ICD-10-CM | POA: Diagnosis not present

## 2017-03-25 DIAGNOSIS — I10 Essential (primary) hypertension: Secondary | ICD-10-CM | POA: Diagnosis not present

## 2017-03-25 DIAGNOSIS — R2681 Unsteadiness on feet: Secondary | ICD-10-CM | POA: Diagnosis not present

## 2017-03-25 DIAGNOSIS — R1311 Dysphagia, oral phase: Secondary | ICD-10-CM | POA: Diagnosis not present

## 2017-03-25 DIAGNOSIS — E785 Hyperlipidemia, unspecified: Secondary | ICD-10-CM | POA: Diagnosis not present

## 2017-03-26 DIAGNOSIS — R1311 Dysphagia, oral phase: Secondary | ICD-10-CM | POA: Diagnosis not present

## 2017-03-26 DIAGNOSIS — K219 Gastro-esophageal reflux disease without esophagitis: Secondary | ICD-10-CM | POA: Diagnosis not present

## 2017-03-26 DIAGNOSIS — G309 Alzheimer's disease, unspecified: Secondary | ICD-10-CM | POA: Diagnosis not present

## 2017-03-26 DIAGNOSIS — R2681 Unsteadiness on feet: Secondary | ICD-10-CM | POA: Diagnosis not present

## 2017-03-26 DIAGNOSIS — I10 Essential (primary) hypertension: Secondary | ICD-10-CM | POA: Diagnosis not present

## 2017-03-26 DIAGNOSIS — E785 Hyperlipidemia, unspecified: Secondary | ICD-10-CM | POA: Diagnosis not present

## 2017-03-27 DIAGNOSIS — I10 Essential (primary) hypertension: Secondary | ICD-10-CM | POA: Diagnosis not present

## 2017-03-27 DIAGNOSIS — R1311 Dysphagia, oral phase: Secondary | ICD-10-CM | POA: Diagnosis not present

## 2017-03-27 DIAGNOSIS — R2681 Unsteadiness on feet: Secondary | ICD-10-CM | POA: Diagnosis not present

## 2017-03-27 DIAGNOSIS — G309 Alzheimer's disease, unspecified: Secondary | ICD-10-CM | POA: Diagnosis not present

## 2017-03-27 DIAGNOSIS — K219 Gastro-esophageal reflux disease without esophagitis: Secondary | ICD-10-CM | POA: Diagnosis not present

## 2017-03-27 DIAGNOSIS — E785 Hyperlipidemia, unspecified: Secondary | ICD-10-CM | POA: Diagnosis not present

## 2017-03-30 DIAGNOSIS — R7989 Other specified abnormal findings of blood chemistry: Secondary | ICD-10-CM | POA: Diagnosis not present

## 2017-03-30 LAB — BASIC METABOLIC PANEL
BUN: 27 — AB (ref 4–21)
CREATININE: 1.2 — AB (ref 0.5–1.1)
Glucose: 138
POTASSIUM: 4.3 (ref 3.4–5.3)
Sodium: 138 (ref 137–147)

## 2017-04-01 LAB — BASIC METABOLIC PANEL
CALCIUM: 9.4
CHLORIDE: 103
CO2: 29

## 2017-06-01 DIAGNOSIS — R32 Unspecified urinary incontinence: Secondary | ICD-10-CM | POA: Diagnosis not present

## 2017-06-01 DIAGNOSIS — N3946 Mixed incontinence: Secondary | ICD-10-CM | POA: Diagnosis not present

## 2017-06-01 DIAGNOSIS — E785 Hyperlipidemia, unspecified: Secondary | ICD-10-CM | POA: Diagnosis not present

## 2017-06-01 DIAGNOSIS — M6281 Muscle weakness (generalized): Secondary | ICD-10-CM | POA: Diagnosis not present

## 2017-06-01 DIAGNOSIS — I82502 Chronic embolism and thrombosis of unspecified deep veins of left lower extremity: Secondary | ICD-10-CM | POA: Diagnosis not present

## 2017-06-01 DIAGNOSIS — G4700 Insomnia, unspecified: Secondary | ICD-10-CM | POA: Diagnosis not present

## 2017-06-03 DIAGNOSIS — N3946 Mixed incontinence: Secondary | ICD-10-CM | POA: Diagnosis not present

## 2017-06-03 DIAGNOSIS — R32 Unspecified urinary incontinence: Secondary | ICD-10-CM | POA: Diagnosis not present

## 2017-06-03 DIAGNOSIS — M6281 Muscle weakness (generalized): Secondary | ICD-10-CM | POA: Diagnosis not present

## 2017-06-03 DIAGNOSIS — E785 Hyperlipidemia, unspecified: Secondary | ICD-10-CM | POA: Diagnosis not present

## 2017-06-03 DIAGNOSIS — G4700 Insomnia, unspecified: Secondary | ICD-10-CM | POA: Diagnosis not present

## 2017-06-03 DIAGNOSIS — I82502 Chronic embolism and thrombosis of unspecified deep veins of left lower extremity: Secondary | ICD-10-CM | POA: Diagnosis not present

## 2017-06-09 DIAGNOSIS — N3946 Mixed incontinence: Secondary | ICD-10-CM | POA: Diagnosis not present

## 2017-06-09 DIAGNOSIS — R32 Unspecified urinary incontinence: Secondary | ICD-10-CM | POA: Diagnosis not present

## 2017-06-09 DIAGNOSIS — R2990 Unspecified symptoms and signs involving the nervous system: Secondary | ICD-10-CM | POA: Diagnosis not present

## 2017-06-09 DIAGNOSIS — R1319 Other dysphagia: Secondary | ICD-10-CM | POA: Diagnosis not present

## 2017-06-09 DIAGNOSIS — M6281 Muscle weakness (generalized): Secondary | ICD-10-CM | POA: Diagnosis not present

## 2017-06-09 DIAGNOSIS — R1312 Dysphagia, oropharyngeal phase: Secondary | ICD-10-CM | POA: Diagnosis not present

## 2017-06-10 ENCOUNTER — Encounter: Payer: Self-pay | Admitting: Family

## 2017-06-10 ENCOUNTER — Non-Acute Institutional Stay: Payer: Medicare Other | Admitting: Family

## 2017-06-10 DIAGNOSIS — R32 Unspecified urinary incontinence: Secondary | ICD-10-CM | POA: Diagnosis not present

## 2017-06-10 DIAGNOSIS — R05 Cough: Secondary | ICD-10-CM

## 2017-06-10 DIAGNOSIS — M6281 Muscle weakness (generalized): Secondary | ICD-10-CM | POA: Diagnosis not present

## 2017-06-10 DIAGNOSIS — R2990 Unspecified symptoms and signs involving the nervous system: Secondary | ICD-10-CM | POA: Diagnosis not present

## 2017-06-10 DIAGNOSIS — R1312 Dysphagia, oropharyngeal phase: Secondary | ICD-10-CM | POA: Diagnosis not present

## 2017-06-10 DIAGNOSIS — R059 Cough, unspecified: Secondary | ICD-10-CM

## 2017-06-10 DIAGNOSIS — R1319 Other dysphagia: Secondary | ICD-10-CM | POA: Diagnosis not present

## 2017-06-10 DIAGNOSIS — N3946 Mixed incontinence: Secondary | ICD-10-CM | POA: Diagnosis not present

## 2017-06-10 NOTE — Progress Notes (Signed)
Location:  [facility] Room Number: 17 Place of Service:  ALF [phone]) Provider:   FNP-C  Blanchie Serve, MD  Patient Care Team: Blanchie Serve, MD as PCP - General (Internal Medicine) McKenzie, Lilia Argue, MD (Inactive) (Anesthesiology) Estill Dooms, MD as Consulting Physician (Internal Medicine) Kristeen Miss, MD as Consulting Physician (Neurosurgery) Shon Hough, MD as Consulting Physician (Ophthalmology) [name], [facility] ([place]) [name], MD as Consulting Physician (Dermatology) , Nelda Bucks, NP as Nurse Practitioner (Family Medicine)  Extended Emergency Contact Information Primary Emergency Contact: [name] Address: [place], [place] of [place] Phone: [phone] Mobile Phone: [phone] Relation: Son  Code Status:  DNR Goals of care: Advanced Directive information Advanced Directives 06/10/2017  Does Patient Have a Medical Advance Directive? Yes  Type of Paramedic of [place];Living will;Out of facility DNR (pink MOST or yellow form)  Does patient want to make changes to medical advance directive? -  Copy of [place] in Chart? Yes  Would patient like information on creating a medical advance directive? -  Pre-existing out of facility DNR order (yellow form or pink MOST form) Yellow form placed in chart (order not valid for inpatient use)     Chief Complaint  Patient presents with  . Acute Visit    cough and congestion    HPI:  Pt is a 84 y.o. female seen today at [facility] for an acute visit for evaluation of cough and congestion.she is seen in her room today per facility Nurse request.Nurse reports patient was coughing during meals and seems to be more confused.Patient denies coughing or shortness of breath despite hearing her cough during this visit.Marland Kitchenshe further denies any  fever,chills or urinary tract infections symptoms.HPI and ROS limited due to cognitive impairment secondary to dementia.     Past Medical History:  Diagnosis Date  . Allergic rhinitis   . Asthma   . Carpal tunnel syndrome 1980  . Chronic low back pain   . Constipation 06/27/2014  . GERD (gastroesophageal reflux disease)   . Hearing loss    Hearing aids  . Hyperglycemia 06/27/2014  . Hyperlipidemia 06/27/2014  . Hypertension   . Insomnia 06/27/2014  . Left leg DVT ([place])    chronic since 2008  . Memory loss 06/27/2014  . Meningioma ([place]) 2000   Dr. Ellene Route  . Osteopenia    Past Surgical History:  Procedure Laterality Date  . BACK SURGERY  1999   ruptured disk  . BRAIN TUMOR EXCISION  2000   Dr. Ellene Route  . CARPAL TUNNEL RELEASE  1980  . TEAR DUCT PROBING Bilateral 2014   Saluidon    No Known Allergies  Outpatient Encounter Medications as of 06/10/2017  Medication Sig  . amLODipine-atorvastatin (CADUET) 5-10 MG tablet TAKE ONE TABLET DAILY FOR BLOOD PRESSURE  . docusate sodium (COLACE) 100 MG capsule Take 100 mg by mouth daily.   Marland Kitchen donepezil (ARICEPT) 10 MG tablet Take 5 mg by mouth at bedtime.   . fluticasone (FLONASE) 50 MCG/ACT nasal spray Place 2 sprays into both nostrils daily as needed.   Marland Kitchen ipratropium-albuterol (DUONEB) 0.5-2.5 (3) MG/3ML SOLN Take 3 mLs by nebulization every 6 (six) hours.  Marland Kitchen levothyroxine (SYNTHROID, LEVOTHROID) 50 MCG tablet Take 1 tablet (50 mcg total) by mouth daily before breakfast.  . lisinopril-hydrochlorothiazide (PRINZIDE,ZESTORETIC) 10-12.5 MG tablet TAKE 1 TABLET ONCE A  DAY.  . memantine (NAMENDA) 10 MG tablet Take 5 mg by mouth 2 (two) times daily.   . Multiple Vitamins-Minerals (ICAPS) CAPS Take 1 capsule by mouth 2 (two) times daily. For eyes  . [DISCONTINUED] metFORMIN (GLUCOPHAGE) 500 MG tablet Take 500 mg by mouth daily with breakfast.    No facility-administered encounter medications on file as of 06/10/2017.     Review of Systems    Unable to perform ROS: Dementia (additional information provided by facility Nurse )  Constitutional: Negative for chills, fatigue, fever and unexpected weight change.  HENT: Positive for hearing loss. Negative for congestion, rhinorrhea, sinus pressure, sinus pain, sneezing and sore throat.   Respiratory: Positive for cough. Negative for chest tightness, shortness of breath and wheezing.   Cardiovascular: Positive for leg swelling. Negative for chest pain and palpitations.  Gastrointestinal: Negative for abdominal distention, abdominal pain, constipation, diarrhea, nausea and vomiting.  Genitourinary: Negative for dysuria, flank pain, frequency and urgency.  Skin: Negative for color change, pallor and rash.  Psychiatric/Behavioral: Negative for agitation and sleep disturbance. The patient is not nervous/anxious.        Confused at her baseline     Immunization History  Administered Date(s) Administered  . Influenza-Unspecified 10/20/2013, 10/05/2014, 10/18/2015, 10/15/2016  . PPD Test 10/10/2013, 03/23/2015, 04/10/2015  . Pneumococcal Polysaccharide-23 02/06/2009  . Td 10/11/2010  . Zoster 01/08/2012   Pertinent  Health Maintenance Due  Topic Date Due  . FOOT EXAM  10/07/1932  . OPHTHALMOLOGY EXAM  10/07/1932  . INFLUENZA VACCINE  08/06/2017  . HEMOGLOBIN A1C  09/02/2017  . DEXA SCAN  Completed  . PNA vac Low Risk Adult  Completed   Fall Risk  08/27/2016 06/12/2015 06/12/2015 03/13/2015 01/09/2015  Falls in the past year? No No No Yes No  Number falls in past yr: - - - 1 -  Injury with Fall? - - - No -    Vitals:   06/10/17 1423  BP: 136/81  Pulse: 74  Resp: 18  Temp: 98.7 F (37.1 C)  SpO2: 94%  Weight: 156 lb (70.8 kg)  Height: 5\' 5"  (1.651 m)   Body mass index is 25.96 kg/m. Physical Exam  Constitutional: She appears well-developed and well-nourished.  Elderly in no acute distress   HENT:  Head: Normocephalic.  Mouth/Throat: Oropharynx is clear and moist. No  oropharyngeal exudate.  Eyes: Pupils are equal, round, and reactive to light. Conjunctivae and EOM are normal. Right eye exhibits no discharge. Left eye exhibits no discharge. No scleral icterus.  Neck: Normal range of motion. No JVD present. No thyromegaly present.  Cardiovascular: Normal rate, regular rhythm, normal heart sounds and intact distal pulses. Exam reveals no gallop and no friction rub.  No murmur heard. Pulmonary/Chest: Effort normal. No respiratory distress. She has decreased breath sounds in the right upper field, the right middle field and the right lower field. She has wheezes in the left upper field. She has rales in the left middle field and the left lower field.  Musculoskeletal: She exhibits no tenderness.  Moves x 4 extremities.unsteady gait ambulates with walker.bilateral lower extremities 1+ edema.   Lymphadenopathy:    She has no cervical adenopathy.  Neurological: Gait abnormal.  Alert and oriented to person and place   Skin: Skin is warm and dry. No rash noted. No erythema. No pallor.  Psychiatric: She has a normal mood and affect. Her speech is normal and behavior is normal. Judgment and thought content normal. She exhibits abnormal recent memory.  Labs reviewed: Recent Labs    01/19/17 03/05/17 03/30/17  NA 143 139  139 138  K 4.2 4.2  4.2 4.3  CL  --  103 103  CO2  --  27 29  BUN 40* 29* 27*  CREATININE 1.5* 1.3*  1.32 1.2*  CALCIUM  --  9.4  9.4 9.4   Recent Labs    01/19/17 03/05/17  AST 12* 13  13  ALT 20 12  12   ALKPHOS 53 60  60  BILITOT  --  0.4  PROT  --  6.1  6.1  ALBUMIN  --  3.8  3.8   Recent Labs    11/06/16 03/05/17  WBC 9.8  --   NEUTROABS 6,145  --   HGB 11.6* 11.0  HCT 34*  --   PLT 241  --    Lab Results  Component Value Date   TSH 4.31 03/19/2017   Lab Results  Component Value Date   HGBA1C 6.9 03/05/2017   Lab Results  Component Value Date   CHOL 166 09/15/2016   HDL 47 09/15/2016   LDLCALC 93  09/15/2016   TRIG 157 09/15/2016    Significant Diagnostic Results in last 30 days:  No results found.  Assessment/Plan  Cough Afebrile.Left upper lobe wheezes,Left mid and lower lobe rales while right lung diminished clear breath sounds noted.will obtain portable CXR 2 views to rule out pneumonia.Portable due to dementia and high risk for falls. Start DuoNebs 0.5-2.5(3)mg/3 Ml inhale 3 mls via Nebulizer x 5 days then re-evaluate.continue Robitussin 10 ml as needed for cough.monitor vital signs every shift x 5 days then resume previous facility protocol.Notify provide if symptoms worsen or not improved.   Family/ staff Communication: Reviewed plan of care with patient and facility Nurse.  Labs/tests ordered:  portable CXR 2 views to rule out pneumonia.   Sandrea Hughs, NP

## 2017-06-15 ENCOUNTER — Encounter: Payer: Self-pay | Admitting: Family

## 2017-06-15 ENCOUNTER — Non-Acute Institutional Stay: Payer: Medicare Other | Admitting: Family

## 2017-06-15 DIAGNOSIS — E039 Hypothyroidism, unspecified: Secondary | ICD-10-CM

## 2017-06-15 DIAGNOSIS — N183 Chronic kidney disease, stage 3 (moderate): Secondary | ICD-10-CM

## 2017-06-15 DIAGNOSIS — F0280 Dementia in other diseases classified elsewhere without behavioral disturbance: Secondary | ICD-10-CM

## 2017-06-15 DIAGNOSIS — R609 Edema, unspecified: Secondary | ICD-10-CM

## 2017-06-15 DIAGNOSIS — E785 Hyperlipidemia, unspecified: Secondary | ICD-10-CM

## 2017-06-15 DIAGNOSIS — B372 Candidiasis of skin and nail: Secondary | ICD-10-CM

## 2017-06-15 DIAGNOSIS — I129 Hypertensive chronic kidney disease with stage 1 through stage 4 chronic kidney disease, or unspecified chronic kidney disease: Secondary | ICD-10-CM | POA: Diagnosis not present

## 2017-06-15 DIAGNOSIS — G309 Alzheimer's disease, unspecified: Secondary | ICD-10-CM

## 2017-06-15 DIAGNOSIS — E1122 Type 2 diabetes mellitus with diabetic chronic kidney disease: Secondary | ICD-10-CM

## 2017-06-15 NOTE — Progress Notes (Signed)
Location:  [facility] Room Number: 17 Place of Service:  ALF [phone]) Provider: Dinah Ngetich FNP-C   Blanchie Serve, MD  Patient Care Team: Blanchie Serve, MD as PCP - General (Internal Medicine) McKenzie, Lilia Argue, MD (Inactive) (Anesthesiology) Estill Dooms, MD as Consulting Physician (Internal Medicine) Kristeen Miss, MD as Consulting Physician (Neurosurgery) Shon Hough, MD as Consulting Physician (Ophthalmology) [name], [facility] ([place]) [name], MD as Consulting Physician (Dermatology) Ngetich, Nelda Bucks, NP as Nurse Practitioner (Family Medicine)  Extended Emergency Contact Information Primary Emergency Contact: [name] Address: [place], [place] Phone: [phone] Mobile Phone: [phone] Relation: Son  Code Status:  DNR  Goals of care: Advanced Directive information Advanced Directives 06/15/2017  Does Patient Have a Medical Advance Directive? Yes  Type of Paramedic of [place];Out of facility DNR (pink MOST or yellow form);Living will  Does patient want to make changes to medical advance directive? -  Copy of [place] in Chart? Yes  Would patient like information on creating a medical advance directive? -  Pre-existing out of facility DNR order (yellow form or pink MOST form) Yellow form placed in chart (order not valid for inpatient use)     Chief Complaint  Patient presents with  . Medical Management of Chronic Issues    HPI:  Pt is a 84 y.o. female seen today [facility] for medical management of chronic diseases.she has a medical history of HTN,Hyperlipidemia,Type 2 DM,CKD stage 3,Hypothyroidism,Alzheimer's dementia without behavioral disturbance among other conditions.she is seen in her room today with facility Nurse present at bedside.she denies any acute issues during  visit though HPI limited due to her cognitive impairment.Facility Nurse reports patient had increased edema on the legs over the weekend 06/13/2017 but no shortness of breath on call provider notified and knee high Ted hose were ordered.Nurse also states patient has had 2 lbs weight gain over one month.Recent seen on 06/10/2017 for cough congestion and wheezing but symptoms improved with breathing treatment.Her chest X-ray ( 06/10/2017) showed no acute pulmonary disease but had chronic lung changes.    Past Medical History:  Diagnosis Date  . Allergic rhinitis   . Asthma   . Carpal tunnel syndrome 1980  . Chronic low back pain   . Constipation 06/27/2014  . GERD (gastroesophageal reflux disease)   . Hearing loss    Hearing aids  . Hyperglycemia 06/27/2014  . Hyperlipidemia 06/27/2014  . Hypertension   . Insomnia 06/27/2014  . Left leg DVT ([place])    chronic since 2008  . Memory loss 06/27/2014  . Meningioma ([place]) 2000   Dr. Ellene Route  . Osteopenia    Past Surgical History:  Procedure Laterality Date  . BACK SURGERY  1999   ruptured disk  . BRAIN TUMOR EXCISION  2000   Dr. Ellene Route  . CARPAL TUNNEL RELEASE  1980  . TEAR DUCT PROBING Bilateral 2014   Saluidon    No Known Allergies  Allergies as of 06/15/2017   No Known Allergies     Medication List        Accurate as of 06/15/17  2:18 PM. Always use your most recent med list.          amLODipine-atorvastatin 5-10 MG tablet Commonly known as:  CADUET TAKE ONE TABLET DAILY FOR BLOOD PRESSURE   docusate sodium 100 MG capsule  Commonly known as:  COLACE Take 100 mg by mouth daily.   donepezil 10 MG tablet Commonly known as:  ARICEPT Take 5 mg by mouth at bedtime.   fluticasone 50 MCG/ACT nasal spray Commonly known as:  FLONASE Place 2 sprays into both nostrils daily as needed.   ICAPS Caps Take 1 capsule by mouth 2 (two) times daily. For eyes   ipratropium-albuterol 0.5-2.5 (3) MG/3ML Soln Commonly known as:  DUONEB Take 3  mLs by nebulization every 6 (six) hours.   levothyroxine 50 MCG tablet Commonly known as:  SYNTHROID, LEVOTHROID Take 1 tablet (50 mcg total) by mouth daily before breakfast.   lisinopril-hydrochlorothiazide 10-12.5 MG tablet Commonly known as:  PRINZIDE,ZESTORETIC TAKE 1 TABLET ONCE A DAY.   memantine 10 MG tablet Commonly known as:  NAMENDA Take 5 mg by mouth 2 (two) times daily.       Review of Systems  Unable to perform ROS: Dementia (additional information provided by facility Nurse )  Constitutional: Negative for activity change, appetite change, chills, fatigue and fever.  HENT: Positive for hearing loss. Negative for congestion, rhinorrhea, sinus pressure, sinus pain, sneezing, sore throat and trouble swallowing.   Eyes: Negative for discharge, redness, itching and visual disturbance.  Respiratory: Negative for cough, chest tightness, shortness of breath and wheezing.   Cardiovascular: Positive for leg swelling. Negative for chest pain and palpitations.  Gastrointestinal: Negative for abdominal distention, abdominal pain, constipation, diarrhea, nausea and vomiting.  Endocrine: Negative for cold intolerance, heat intolerance, polydipsia, polyphagia and polyuria.  Genitourinary: Negative for dysuria, flank pain, frequency and urgency.  Musculoskeletal: Positive for gait problem.  Skin: Negative for color change, pallor, rash and wound.  Neurological: Negative for dizziness, weakness, light-headedness and headaches.  Hematological: Does not bruise/bleed easily.  Psychiatric/Behavioral: Positive for confusion. Negative for agitation and sleep disturbance. The patient is not nervous/anxious.     Immunization History  Administered Date(s) Administered  . Influenza-Unspecified 10/20/2013, 10/05/2014, 10/18/2015, 10/15/2016  . PPD Test 10/10/2013, 03/23/2015, 04/10/2015  . Pneumococcal Polysaccharide-23 02/06/2009  . Td 10/11/2010  . Zoster 01/08/2012   Pertinent  Health  Maintenance Due  Topic Date Due  . FOOT EXAM  10/07/1932  . INFLUENZA VACCINE  08/06/2017  . HEMOGLOBIN A1C  09/02/2017  . OPHTHALMOLOGY EXAM  02/27/2018  . DEXA SCAN  Completed  . PNA vac Low Risk Adult  Completed   Fall Risk  08/27/2016 06/12/2015 06/12/2015 03/13/2015 01/09/2015  Falls in the past year? No No No Yes No  Number falls in past yr: - - - 1 -  Injury with Fall? - - - No -    Vitals:   06/15/17 1057  BP: (!) 117/50  Pulse: 85  Resp: 20  Temp: (!) 97 F (36.1 C)  SpO2: 90%  Weight: 156 lb (70.8 kg)  Height: 5\' 5"  (1.651 m)   Body mass index is 25.96 kg/m.   Weight 148.8 lbs ( 04/15/2017) Weight 154.5 lbs ( 05/13/2017) Weight 156.0 lbs ( 06/10/2017)  Physical Exam  Constitutional: She appears well-developed and well-nourished.  Elderly in no acute distress   HENT:  Head: Normocephalic.  Right Ear: External ear normal.  Left Ear: External ear normal.  Mouth/Throat: Oropharynx is clear and moist. No oropharyngeal exudate.  Eyes: Pupils are equal, round, and reactive to light. Conjunctivae and EOM are normal. Right eye exhibits no discharge. Left eye exhibits no discharge. No scleral icterus.  Neck: Normal range of motion. No JVD present. No thyromegaly present.  Cardiovascular: Normal  rate, regular rhythm, normal heart sounds and intact distal pulses. Exam reveals no gallop and no friction rub.  No murmur heard. Pulmonary/Chest: Effort normal and breath sounds normal. No stridor. No respiratory distress. She has no wheezes. She has no rales.  Abdominal: Soft. Bowel sounds are normal. She exhibits no distension and no mass. There is no tenderness. There is no rebound and no guarding.  Musculoskeletal: She exhibits no tenderness.  Unsteady gait ambulates with Front wheel walker.bilateral lower extremities 1+ edema  Lymphadenopathy:    She has no cervical adenopathy.  Neurological: She has normal strength. Gait abnormal.  Alert and oriented to person and place but not  date,day or year.  Skin: Skin is warm and dry. No rash noted. No pallor.  Beefy redness between gluteal folds noted.No signs of infections.  Psychiatric: She has a normal mood and affect. Her speech is normal and behavior is normal. Judgment and thought content normal. She exhibits abnormal recent memory.  Nursing note and vitals reviewed.   Labs reviewed: Recent Labs    01/19/17 03/05/17 03/30/17  NA 143 139  139 138  K 4.2 4.2  4.2 4.3  CL  --  103 103  CO2  --  27 29  BUN 40* 29* 27*  CREATININE 1.5* 1.3*  1.32 1.2*  CALCIUM  --  9.4  9.4 9.4   Recent Labs    01/19/17 03/05/17  AST 12* 13  13  ALT 20 12  12   ALKPHOS 53 60  60  BILITOT  --  0.4  PROT  --  6.1  6.1  ALBUMIN  --  3.8  3.8   Recent Labs    11/06/16 03/05/17  WBC 9.8  --   NEUTROABS 6,145  --   HGB 11.6* 11.0  HCT 34*  --   PLT 241  --    Lab Results  Component Value Date   TSH 4.31 03/19/2017   Lab Results  Component Value Date   HGBA1C 6.9 03/05/2017   Lab Results  Component Value Date   CHOL 166 09/15/2016   HDL 47 09/15/2016   LDLCALC 93 09/15/2016   TRIG 157 09/15/2016    Significant Diagnostic Results in last 30 days:  No results found.  Assessment/Plan 1. Edema Progressive weight gain noted.recent cough,congestion and wheezing has resolved.Chest X-ray negative for acute pulmonary disease except for chronic lung changes noted. Weight 148.8 lbs ( 04/15/2017) Weight 154.5 lbs ( 05/13/2017) Weight 156.0 lbs ( 06/10/2017)No shortness of breath on exam.continue with bilateral knee high ted hose on in the morning and off at bedtime as order by on call provider.monitor weight three times per week on Monday,wednesday and Friday x 1 week then provide results for provider to evaluate.will add low dose furosemide or increase her HZCT to 25 mg tablet daily.check CMP 06/18/2017.continue to monitor.  2. Type 2 diabetes mellitus with stage 3 chronic kidney disease, without long-term current use of  insulin  Lab Results  Component Value Date   HGBA1C 6.9 03/05/2017   CBG  reviewed readings ranging in the 140's-180's with few sporadic readings in the 200's possible snacks.on ACE inhibitor and Statin.off ASA due to high risk for falls.Last eye exam done 02/27/2017 no diabetic eye changes noted.Due for foot exam.Refer to Podiatrist for diabetic foot exam.continue to monitor.check Hgb A1C 06/18/2017.  3. Benign hypertension with CKD (chronic kidney disease) stage III B/p readings reviewed 110'/50's-150's/70's.Few soft readings but with her leg edema.Will discontinue her amlodipine if leg edema  worsen.continue on lisinopril-HZCT 10-12.5 mg tablet daily and amlodipine-atorvastatin  5-10 mg daily.continue to monitor.check CBC and CMP 06/18/2017.   4. Hyperlipidemia LDL goal <70 LDL not at goal.  Lab Results  Component Value Date   CHOL 166 09/15/2016   HDL 47 09/15/2016   LDLCALC 93 09/15/2016   TRIG 157 09/15/2016  Continue on Amlodipine-atorvastatin 5-10 mg tablet daily.check Fasting lipid panel 06/18/2017.   5. Hypothyroidism Lab Results  Component Value Date   TSH 4.31 03/19/2017  Continue on levothyroxine 50 mcg tablet daily.check TSH level 06/18/2017.   6. Alzheimer's dementia without behavioral disturbance, unspecified timing of dementia onset No new behavioral issues reported.continue on Namenda 5 mg tablet twice daily and donepezil 5 mg tablet daily at bedtime.continue with supportive care.Fall and safety precautions.   7. Candidiasis  Beefy redness noted between gluteal folds.apply Nystatin 100,000 units cream twice daily X 14 days.continue to cleanse area with soap and water and pat dry.   Family/ staff Communication: Reviewed plan of care with patient and facility Nurse.   Labs/tests ordered: CBC,CMP,TSH level,Hgb A1C and fasting lipid panel.   Sandrea Hughs, NP

## 2017-06-15 NOTE — Addendum Note (Signed)
Addended byMarlowe Sax C on: 06/15/2017 04:35 PM   Modules accepted: Level of Service

## 2017-06-16 DIAGNOSIS — R2990 Unspecified symptoms and signs involving the nervous system: Secondary | ICD-10-CM | POA: Diagnosis not present

## 2017-06-16 DIAGNOSIS — N3946 Mixed incontinence: Secondary | ICD-10-CM | POA: Diagnosis not present

## 2017-06-16 DIAGNOSIS — R1319 Other dysphagia: Secondary | ICD-10-CM | POA: Diagnosis not present

## 2017-06-16 DIAGNOSIS — R32 Unspecified urinary incontinence: Secondary | ICD-10-CM | POA: Diagnosis not present

## 2017-06-16 DIAGNOSIS — M6281 Muscle weakness (generalized): Secondary | ICD-10-CM | POA: Diagnosis not present

## 2017-06-16 DIAGNOSIS — R1312 Dysphagia, oropharyngeal phase: Secondary | ICD-10-CM | POA: Diagnosis not present

## 2017-06-18 DIAGNOSIS — E119 Type 2 diabetes mellitus without complications: Secondary | ICD-10-CM | POA: Diagnosis not present

## 2017-06-18 DIAGNOSIS — R197 Diarrhea, unspecified: Secondary | ICD-10-CM | POA: Diagnosis not present

## 2017-06-18 DIAGNOSIS — E785 Hyperlipidemia, unspecified: Secondary | ICD-10-CM | POA: Diagnosis not present

## 2017-06-18 DIAGNOSIS — E039 Hypothyroidism, unspecified: Secondary | ICD-10-CM | POA: Diagnosis not present

## 2017-06-18 LAB — COMPLETE METABOLIC PANEL WITH GFR
ALBUMIN: 3.9
ALT: 16
AST: 13
Alkaline Phosphatase: 64
BILIRUBIN TOTAL: 0.4
BUN: 27 — AB (ref 4–21)
Calcium: 9.2
Creat: 1.26
GLUCOSE: 222
POTASSIUM: 4.5
SODIUM: 141
TOTAL PROTEIN: 6.4 g/dL

## 2017-06-18 LAB — CBC
HCT: 34.7
HGB: 11.8
MCV: 92.3
PLATELET COUNT: 261
WBC: 8

## 2017-06-18 LAB — LIPID PANEL
CHOLESTEROL: 172 (ref 0–200)
Cholesterol: 172
HDL: 41
HDL: 41 (ref 35–70)
LDL CALC: 100
LDL CALC: 100
TRIGLYCERIDES: 193
TRIGLYCERIDES: 193 — AB (ref 40–160)

## 2017-06-18 LAB — CBC AND DIFFERENTIAL
HEMATOCRIT: 35 — AB (ref 36–46)
HEMOGLOBIN: 11.8 — AB (ref 12.0–16.0)
Neutrophils Absolute: 4016
PLATELETS: 261 (ref 150–399)
WBC: 8

## 2017-06-18 LAB — HEMOGLOBIN A1C
A1c: 7.7
HEMOGLOBIN A1C: 7.7

## 2017-06-18 LAB — TSH
TSH: 5.1
TSH: 5.1 (ref 0.41–5.90)

## 2017-06-18 LAB — BASIC METABOLIC PANEL
CREATININE: 1.3 — AB (ref 0.5–1.1)
Glucose: 22
Potassium: 4.5 (ref 3.4–5.3)
Sodium: 141 (ref 137–147)

## 2017-06-19 ENCOUNTER — Encounter: Payer: Self-pay | Admitting: Internal Medicine

## 2017-06-19 ENCOUNTER — Non-Acute Institutional Stay: Payer: Medicare Other | Admitting: Internal Medicine

## 2017-06-19 ENCOUNTER — Encounter: Payer: Self-pay | Admitting: *Deleted

## 2017-06-19 DIAGNOSIS — N183 Chronic kidney disease, stage 3 unspecified: Secondary | ICD-10-CM

## 2017-06-19 DIAGNOSIS — E1129 Type 2 diabetes mellitus with other diabetic kidney complication: Secondary | ICD-10-CM | POA: Insufficient documentation

## 2017-06-19 DIAGNOSIS — R05 Cough: Secondary | ICD-10-CM | POA: Diagnosis not present

## 2017-06-19 DIAGNOSIS — E039 Hypothyroidism, unspecified: Secondary | ICD-10-CM | POA: Diagnosis not present

## 2017-06-19 DIAGNOSIS — R1312 Dysphagia, oropharyngeal phase: Secondary | ICD-10-CM | POA: Diagnosis not present

## 2017-06-19 DIAGNOSIS — N3946 Mixed incontinence: Secondary | ICD-10-CM | POA: Diagnosis not present

## 2017-06-19 DIAGNOSIS — R1319 Other dysphagia: Secondary | ICD-10-CM | POA: Diagnosis not present

## 2017-06-19 DIAGNOSIS — M6281 Muscle weakness (generalized): Secondary | ICD-10-CM | POA: Diagnosis not present

## 2017-06-19 DIAGNOSIS — R32 Unspecified urinary incontinence: Secondary | ICD-10-CM | POA: Diagnosis not present

## 2017-06-19 DIAGNOSIS — E1122 Type 2 diabetes mellitus with diabetic chronic kidney disease: Secondary | ICD-10-CM | POA: Diagnosis not present

## 2017-06-19 DIAGNOSIS — R2990 Unspecified symptoms and signs involving the nervous system: Secondary | ICD-10-CM | POA: Diagnosis not present

## 2017-06-19 DIAGNOSIS — R059 Cough, unspecified: Secondary | ICD-10-CM

## 2017-06-19 NOTE — Progress Notes (Signed)
Location:  [facility] Room Number: 17 Place of Service:  ALF [phone]) Provider:  Blanchie Serve, MD  Blanchie Serve, MD  Patient Care Team: Blanchie Serve, MD as PCP - General (Internal Medicine) McKenzie, Lilia Argue, MD (Inactive) (Anesthesiology) Estill Dooms, MD as Consulting Physician (Internal Medicine) Kristeen Miss, MD as Consulting Physician (Neurosurgery) Shon Hough, MD as Consulting Physician (Ophthalmology) [name], [facility] ([place] [facility] [place]) [name], MD as Consulting Physician (Dermatology) Ngetich, Nelda Bucks, NP as Nurse Practitioner (Family Medicine)  Extended Emergency Contact Information Primary Emergency Contact: [name] Address: [place], [place] Phone: [phone] Mobile Phone: [phone] Relation: Son  Code Status:  DNR  Goals of care: Advanced Directive information Advanced Directives 06/19/2017  Does Patient Have a Medical Advance Directive? Yes  Type of Paramedic of [place];Out of facility DNR (pink MOST or yellow form);Living will  Does patient want to make changes to medical advance directive? -  Copy of [place] in Chart? Yes  Would patient like information on creating a medical advance directive? -  Pre-existing out of facility DNR order (yellow form or pink MOST form) Yellow form placed in chart (order not valid for inpatient use)     Chief Complaint  Patient presents with  . Acute Visit    cough, congestion, abnormal lab    HPI:  Pt is a 84 y.o. female seen today for an acute visit. She is seen with nursing supervisor present.   Cough- per SBAR worsening cough and congestion. Pt denies any. Per nursing, coughing episode with non productive cough noted during meals. Pt denies any cough, chest congestion, URI symptom this visit.  Abnormal TSH- LAB 6/13 with TSH 5.10.  Currently on levothyroxine 50 mcg daily. Denies constipation or cold intolerance  Dm type 2- a1c resulted 7.7 compared to 6.9 in past. On lisinopril for renal protection. Not on any oral hypoglycemic. On chart review fasting blood sugar is >150. Weighed about 145 lb in 4/19 and now weighs 156 lb   Past Medical History:  Diagnosis Date  . Allergic rhinitis   . Asthma   . Carpal tunnel syndrome 1980  . Chronic low back pain   . Constipation 06/27/2014  . GERD (gastroesophageal reflux disease)   . Hearing loss    Hearing aids  . Hyperglycemia 06/27/2014  . Hyperlipidemia 06/27/2014  . Hypertension   . Insomnia 06/27/2014  . Left leg DVT ([facility])    chronic since 2008  . Memory loss 06/27/2014  . Meningioma ([facility]) 2000   Dr. Ellene Route  . Osteopenia    Past Surgical History:  Procedure Laterality Date  . BACK SURGERY  1999   ruptured disk  . BRAIN TUMOR EXCISION  2000   Dr. Ellene Route  . CARPAL TUNNEL RELEASE  1980  . TEAR DUCT PROBING Bilateral 2014   Saluidon    No Known Allergies  Outpatient Encounter Medications as of 06/19/2017  Medication Sig  . amLODipine-atorvastatin (CADUET) 5-10 MG tablet TAKE ONE TABLET DAILY FOR BLOOD PRESSURE  . docusate sodium (COLACE) 100 MG capsule Take 100 mg by mouth daily.   Marland Kitchen donepezil (ARICEPT) 10 MG tablet Take 5 mg by mouth at bedtime.   . fluticasone (FLONASE) 50 MCG/ACT nasal spray Place 2 sprays into both nostrils daily as needed.   Marland Kitchen levothyroxine (SYNTHROID, LEVOTHROID) 50 MCG tablet Take 1 tablet (  50 mcg total) by mouth daily before breakfast.  . lisinopril-hydrochlorothiazide (PRINZIDE,ZESTORETIC) 10-12.5 MG tablet TAKE 1 TABLET ONCE A DAY.  . memantine (NAMENDA) 10 MG tablet Take 5 mg by mouth 2 (two) times daily.   . Multiple Vitamins-Minerals (ICAPS) CAPS Take 1 capsule by mouth 2 (two) times daily. For eyes  . nystatin cream (MYCOSTATIN) Apply 1 application topically 2 (two) times daily.  Marland Kitchen ipratropium-albuterol (DUONEB) 0.5-2.5 (3)  MG/3ML SOLN Take 3 mLs by nebulization every 6 (six) hours.   No facility-administered encounter medications on file as of 06/19/2017.     Review of Systems  Constitutional: Negative for appetite change, chills, fatigue and fever.  HENT: Positive for hearing loss. Negative for ear pain, rhinorrhea, sinus pressure, sore throat, trouble swallowing and voice change.   Respiratory: Negative for choking, shortness of breath and wheezing.   Cardiovascular: Negative for chest pain and palpitations.  Gastrointestinal: Negative for abdominal pain, constipation, diarrhea, nausea and vomiting.  Endocrine: Negative for cold intolerance.  Genitourinary: Negative for dysuria.  Musculoskeletal: Positive for gait problem.  Skin: Negative for rash.  Neurological: Negative for dizziness and headaches.  Psychiatric/Behavioral: Positive for confusion. Negative for behavioral problems.    Immunization History  Administered Date(s) Administered  . Influenza-Unspecified 10/20/2013, 10/05/2014, 10/18/2015, 10/15/2016  . PPD Test 10/10/2013, 03/23/2015, 04/10/2015  . Pneumococcal Polysaccharide-23 02/06/2009  . Td 10/11/2010  . Zoster 01/08/2012   Pertinent  Health Maintenance Due  Topic Date Due  . FOOT EXAM  10/07/1932  . INFLUENZA VACCINE  08/06/2017  . HEMOGLOBIN A1C  09/02/2017  . OPHTHALMOLOGY EXAM  02/27/2018  . DEXA SCAN  Completed  . PNA vac Low Risk Adult  Completed   Fall Risk  08/27/2016 06/12/2015 06/12/2015 03/13/2015 01/09/2015  Falls in the past year? No No No Yes No  Number falls in past yr: - - - 1 -  Injury with Fall? - - - No -   Functional Status Survey:    Vitals:   06/19/17 0944  BP: 131/62  Pulse: 84  Resp: 20  Temp: 97.6 F (36.4 C)  SpO2: 98%  Weight: 156 lb (70.8 kg)  Height: 5\' 5"  (1.651 m)   Body mass index is 25.96 kg/m.   Wt Readings from Last 3 Encounters:  06/19/17 156 lb (70.8 kg)  06/15/17 156 lb (70.8 kg)  06/10/17 156 lb (70.8 kg)   Physical Exam    Constitutional: She appears well-developed and well-nourished. No distress.  HENT:  Head: Normocephalic and atraumatic.  Mouth/Throat: Oropharynx is clear and moist.  Eyes: Conjunctivae and EOM are normal.  Neck: Normal range of motion. Neck supple.  Cardiovascular: Normal rate and regular rhythm.  Pulmonary/Chest: Effort normal and breath sounds normal. No respiratory distress. She has no wheezes. She has no rales.  Abdominal: Soft. Bowel sounds are normal. There is no tenderness. There is no guarding.  Musculoskeletal: She exhibits edema.  Able to move all 4 extremities, uses walker for ambulation  Lymphadenopathy:    She has no cervical adenopathy.  Neurological: She is alert.  Oriented to person and place  Skin: Skin is warm and dry. She is not diaphoretic.  Psychiatric: She has a normal mood and affect. Her behavior is normal.    Labs reviewed: Recent Labs    01/19/17 03/05/17 03/30/17  NA 143 139  139 138  K 4.2 4.2  4.2 4.3  CL  --  103 103  CO2  --  27 29  BUN 40* 29* 27*  CREATININE 1.5* 1.3*  1.32 1.2*  CALCIUM  --  9.4  9.4 9.4   Recent Labs    01/19/17 03/05/17  AST 12* 13  13  ALT 20 12  12   ALKPHOS 53 60  60  BILITOT  --  0.4  PROT  --  6.1  6.1  ALBUMIN  --  3.8  3.8   Recent Labs    11/06/16 03/05/17  WBC 9.8  --   NEUTROABS 6,145  --   HGB 11.6* 11.0  HCT 34*  --   PLT 241  --    Lab Results  Component Value Date   TSH 4.31 03/19/2017   Lab Results  Component Value Date   HGBA1C 6.9 03/05/2017   Lab Results  Component Value Date   CHOL 166 09/15/2016   HDL 47 09/15/2016   LDLCALC 93 09/15/2016   TRIG 157 09/15/2016    Significant Diagnostic Results in last 30 days:  No results found.  Assessment/Plan  1. Acquired hypothyroidism Change levothyroxine to 75 mcg daily for now, check TSH in 3 months.   2. Type 2 diabetes mellitus with stage 3 chronic kidney disease, without long-term current use of insulin (HCC) a1c  suggestive of worsening glycemic control. Was on metformin before. With her renal impairment and controlled a1c, this was discontinuedAlso has gained weight. Continue ACEI and statin. Start tradjenta 5 mg daily with goal a1c <7.5. Check cbg daily for now. a1c recheck in 3 months  3. Cough No coughing this visit. Per nursing had cough last week that has subsided this week and noted mainly with meals. Has poor dentition and dementia, thus high risk for aspiration, obtain SLP consult   Family/ staff Communication: reviewed care plan with patient and charge nurse.    Labs/tests ordered:  A1c, bmp, TSH in 3 months  Blanchie Serve, MD Internal Medicine [facility] [address] [place], [place] [address] Cell Phone (Monday-Friday 8 am - 5 pm): [phone] On Call: [phone] and follow prompts after 5 pm and on weekends Office Phone: [phone] Office Fax: [phone]

## 2017-06-22 DIAGNOSIS — R1319 Other dysphagia: Secondary | ICD-10-CM | POA: Diagnosis not present

## 2017-06-22 DIAGNOSIS — R1312 Dysphagia, oropharyngeal phase: Secondary | ICD-10-CM | POA: Diagnosis not present

## 2017-06-22 DIAGNOSIS — M6281 Muscle weakness (generalized): Secondary | ICD-10-CM | POA: Diagnosis not present

## 2017-06-22 DIAGNOSIS — R2990 Unspecified symptoms and signs involving the nervous system: Secondary | ICD-10-CM | POA: Diagnosis not present

## 2017-06-22 DIAGNOSIS — R32 Unspecified urinary incontinence: Secondary | ICD-10-CM | POA: Diagnosis not present

## 2017-06-22 DIAGNOSIS — N3946 Mixed incontinence: Secondary | ICD-10-CM | POA: Diagnosis not present

## 2017-06-23 DIAGNOSIS — R1319 Other dysphagia: Secondary | ICD-10-CM | POA: Diagnosis not present

## 2017-06-23 DIAGNOSIS — R32 Unspecified urinary incontinence: Secondary | ICD-10-CM | POA: Diagnosis not present

## 2017-06-23 DIAGNOSIS — R2990 Unspecified symptoms and signs involving the nervous system: Secondary | ICD-10-CM | POA: Diagnosis not present

## 2017-06-23 DIAGNOSIS — N3946 Mixed incontinence: Secondary | ICD-10-CM | POA: Diagnosis not present

## 2017-06-23 DIAGNOSIS — R1312 Dysphagia, oropharyngeal phase: Secondary | ICD-10-CM | POA: Diagnosis not present

## 2017-06-23 DIAGNOSIS — M6281 Muscle weakness (generalized): Secondary | ICD-10-CM | POA: Diagnosis not present

## 2017-06-25 DIAGNOSIS — M6281 Muscle weakness (generalized): Secondary | ICD-10-CM | POA: Diagnosis not present

## 2017-06-25 DIAGNOSIS — R2990 Unspecified symptoms and signs involving the nervous system: Secondary | ICD-10-CM | POA: Diagnosis not present

## 2017-06-25 DIAGNOSIS — R1312 Dysphagia, oropharyngeal phase: Secondary | ICD-10-CM | POA: Diagnosis not present

## 2017-06-25 DIAGNOSIS — R32 Unspecified urinary incontinence: Secondary | ICD-10-CM | POA: Diagnosis not present

## 2017-06-25 DIAGNOSIS — R1319 Other dysphagia: Secondary | ICD-10-CM | POA: Diagnosis not present

## 2017-06-25 DIAGNOSIS — N3946 Mixed incontinence: Secondary | ICD-10-CM | POA: Diagnosis not present

## 2017-06-29 DIAGNOSIS — R1312 Dysphagia, oropharyngeal phase: Secondary | ICD-10-CM | POA: Diagnosis not present

## 2017-06-29 DIAGNOSIS — M6281 Muscle weakness (generalized): Secondary | ICD-10-CM | POA: Diagnosis not present

## 2017-06-29 DIAGNOSIS — R1319 Other dysphagia: Secondary | ICD-10-CM | POA: Diagnosis not present

## 2017-06-29 DIAGNOSIS — R2990 Unspecified symptoms and signs involving the nervous system: Secondary | ICD-10-CM | POA: Diagnosis not present

## 2017-06-29 DIAGNOSIS — N3946 Mixed incontinence: Secondary | ICD-10-CM | POA: Diagnosis not present

## 2017-06-29 DIAGNOSIS — R32 Unspecified urinary incontinence: Secondary | ICD-10-CM | POA: Diagnosis not present

## 2017-07-15 ENCOUNTER — Encounter: Payer: Medicare Other | Admitting: Internal Medicine

## 2017-07-20 ENCOUNTER — Encounter: Payer: Self-pay | Admitting: Internal Medicine

## 2017-07-20 ENCOUNTER — Non-Acute Institutional Stay: Payer: Medicare Other | Admitting: Internal Medicine

## 2017-07-20 VITALS — BP 132/62 | HR 71 | Temp 97.6°F | Resp 16 | Ht 65.0 in | Wt 156.4 lb

## 2017-07-20 DIAGNOSIS — I10 Essential (primary) hypertension: Secondary | ICD-10-CM | POA: Diagnosis not present

## 2017-07-20 DIAGNOSIS — K219 Gastro-esophageal reflux disease without esophagitis: Secondary | ICD-10-CM | POA: Diagnosis not present

## 2017-07-20 DIAGNOSIS — E1122 Type 2 diabetes mellitus with diabetic chronic kidney disease: Secondary | ICD-10-CM

## 2017-07-20 DIAGNOSIS — N183 Chronic kidney disease, stage 3 unspecified: Secondary | ICD-10-CM

## 2017-07-20 DIAGNOSIS — R32 Unspecified urinary incontinence: Secondary | ICD-10-CM

## 2017-07-20 DIAGNOSIS — R059 Cough, unspecified: Secondary | ICD-10-CM

## 2017-07-20 DIAGNOSIS — J849 Interstitial pulmonary disease, unspecified: Secondary | ICD-10-CM | POA: Diagnosis not present

## 2017-07-20 DIAGNOSIS — R05 Cough: Secondary | ICD-10-CM | POA: Diagnosis not present

## 2017-07-20 LAB — CMP 10231
ALBUMIN: 3.9
CALCIUM: 9.2
CO2: 29
Chloride: 102
EGFR (Non-African Amer.): 37
GLOBULIN: 2.5
Total Protein: 6.4

## 2017-07-20 MED ORDER — LISINOPRIL 10 MG PO TABS: 10.0000 mg | ORAL_TABLET | Freq: Every day | ORAL | 3 refills | Status: DC

## 2017-07-20 MED ORDER — OMEPRAZOLE 20 MG PO CPDR: 20.0000 mg | DELAYED_RELEASE_CAPSULE | Freq: Every day | ORAL | 0 refills | Status: DC

## 2017-07-20 MED ORDER — MIRABEGRON ER 25 MG PO TB24: 25.0000 mg | ORAL_TABLET | Freq: Every day | ORAL | 0 refills | Status: DC

## 2017-07-20 MED ORDER — IPRATROPIUM-ALBUTEROL 0.5-2.5 (3) MG/3ML IN SOLN: 3.0000 mL | Freq: Two times a day (BID) | RESPIRATORY_TRACT | 0 refills | Status: DC

## 2017-07-20 NOTE — Patient Instructions (Addendum)
  We will discontinue lisinopril-hydrochlorothiazide combination and have you take lisinopril 10 mg daily.   I want you to take omeprazole 20 mg daily for now to see if it will help with cough (thinking it to be from reflux)  I am starting mirabegron 25 mg daily to help with your urine leakage.  I want you to use breathing treatment twice a day.  I will see you in your room in a week.

## 2017-07-20 NOTE — Progress Notes (Signed)
Location:  [facility] of Service:  Clinic (12) Provider:  Blanchie Serve, MD  Blanchie Serve, MD  Patient Care Team: Blanchie Serve, MD as PCP - General (Internal Medicine) McKenzie, Lilia Argue, MD (Inactive) (Anesthesiology) Estill Dooms, MD as Consulting Physician (Internal Medicine) Kristeen Miss, MD as Consulting Physician (Neurosurgery) Shon Hough, MD as Consulting Physician (Ophthalmology) [name], [facility] ([place]) [name], MD as Consulting Physician (Dermatology) Ngetich, Nelda Bucks, NP as Nurse Practitioner (Family Medicine)  Extended Emergency Contact Information Primary Emergency Contact: [name] Address: [address]          [name] Phone: [phone] Mobile Phone: [phone] Relation: Son Secondary Emergency Contact: Laren, [place] Mobile Phone: [phone] Relation: Daughter Preferred language: English Interpreter needed? No  Code Status:  DNR  Goals of care: Advanced Directive information Advanced Directives 07/20/2017  Does Patient Have a Medical Advance Directive? Yes  Type of Paramedic of [place];Living will;Out of facility DNR (pink MOST or yellow form)  Does patient want to make changes to medical advance directive? No - Patient declined  Copy of [place] in Chart? Yes  Would patient like information on creating a medical advance directive? -  Pre-existing out of facility DNR order (yellow form or pink MOST form) Yellow form placed in chart (order not valid for inpatient use)     Chief Complaint  Patient presents with  . Acute Visit    urine incontinence, cough, blood sugar, medication list review    HPI:  Pt is a 84 y.o. female seen today for an acute visit for multiple reason. She resides in ALF at present. Over last few weeks, she is requiring increased assistance with ADL with  urinary incontinence episodes. Staff recommend her moving to SNF be taken into consideration. Her son and daughter in law are present this visit.   Cough- ongoing for about 2 months, wet quality cough, no known history of reflux disease or hiatal hernia. Family mentions her being a smoker in past. Reviewed chest xray from 06/2017 showing increased interstitial markings bilaterally. No recent fever reported. Pt denies dyspnea. She walked from ALF to clinic without having to take break in between.   Type 2 diabetes- blood sugar below 150, no hypoglycemic episode, on tradjenta 5 mg daily. Tolerating it well. Appetite is good. Has urinary incontinence.    Hypertension- controlled reading on review, on lisinopril-hctz and amlodipine at present. Has increased urinary frequency.   Dementia without behavioral disturbance- on namenda and donepezil. No behavioral changes reported. Has worked with SLP with cough, they have cleared her.   Urinary incontinence- for few weeks. With her dementia, detailed and specific history is limited. No groin rash or itching reported.      Past Medical History:  Diagnosis Date  . Allergic rhinitis   . Asthma   . Carpal tunnel syndrome 1980  . Chronic low back pain   . Constipation 06/27/2014  . GERD (gastroesophageal reflux disease)   . Hearing loss    Hearing aids  . Hyperglycemia 06/27/2014  . Hyperlipidemia 06/27/2014  . Hypertension   . Insomnia 06/27/2014  . Left leg DVT ([place])    chronic since 2008  . Memory loss 06/27/2014  . Meningioma ([place]) 2000   Dr. Ellene Route  . Osteopenia    Past Surgical History:  Procedure Laterality Date  . BACK SURGERY  1999   ruptured disk  .  BRAIN TUMOR EXCISION  2000   Dr. Ellene Route  . CARPAL TUNNEL RELEASE  1980  . TEAR DUCT PROBING Bilateral 2014   Saluidon    No Known Allergies  Outpatient Encounter Medications as of 07/20/2017  Medication Sig  . amLODipine-atorvastatin (CADUET) 5-10 MG tablet TAKE ONE TABLET DAILY FOR  BLOOD PRESSURE  . docusate sodium (COLACE) 100 MG capsule Take 100 mg by mouth daily.   Marland Kitchen donepezil (ARICEPT) 10 MG tablet Take 5 mg by mouth at bedtime.   . fluticasone (FLONASE) 50 MCG/ACT nasal spray Place 2 sprays into both nostrils daily as needed.   Marland Kitchen levothyroxine (SYNTHROID, LEVOTHROID) 75 MCG tablet Take 75 mcg by mouth daily before breakfast.  . linagliptin (TRADJENTA) 5 MG TABS tablet Take 5 mg by mouth daily.  . memantine (NAMENDA) 10 MG tablet Take 5 mg by mouth 2 (two) times daily.   . Multiple Vitamins-Minerals (ICAPS) CAPS Take 1 capsule by mouth 2 (two) times daily. For eyes  . [DISCONTINUED] lisinopril-hydrochlorothiazide (PRINZIDE,ZESTORETIC) 10-12.5 MG tablet TAKE 1 TABLET ONCE A DAY.  Marland Kitchen ipratropium-albuterol (DUONEB) 0.5-2.5 (3) MG/3ML SOLN Take 3 mLs by nebulization 2 (two) times daily.  Marland Kitchen lisinopril (PRINIVIL,ZESTRIL) 10 MG tablet Take 1 tablet (10 mg total) by mouth daily.  . mirabegron ER (MYRBETRIQ) 25 MG TB24 tablet Take 1 tablet (25 mg total) by mouth daily.  Marland Kitchen omeprazole (PRILOSEC) 20 MG capsule Take 1 capsule (20 mg total) by mouth daily.  . [DISCONTINUED] ipratropium-albuterol (DUONEB) 0.5-2.5 (3) MG/3ML SOLN Take 3 mLs by nebulization every 6 (six) hours.  . [DISCONTINUED] levothyroxine (SYNTHROID, LEVOTHROID) 50 MCG tablet Take 1 tablet (50 mcg total) by mouth daily before breakfast.   No facility-administered encounter medications on file as of 07/20/2017.     Review of Systems  Constitutional: Negative for appetite change, chills, fatigue and fever.  HENT: Positive for hearing loss. Negative for congestion, ear discharge, ear pain, rhinorrhea and trouble swallowing.   Respiratory: Positive for cough. Negative for choking and shortness of breath.   Cardiovascular: Negative for chest pain and palpitations.  Gastrointestinal: Negative for abdominal pain, constipation, diarrhea, nausea and vomiting.  Genitourinary: Positive for frequency and urgency. Negative  for dysuria, hematuria and pelvic pain.  Musculoskeletal: Positive for gait problem. Negative for back pain.  Neurological: Negative for weakness.  Psychiatric/Behavioral: Positive for confusion. Negative for behavioral problems.    Immunization History  Administered Date(s) Administered  . Influenza-Unspecified 10/20/2013, 10/05/2014, 10/18/2015, 10/15/2016  . PPD Test 10/10/2013, 03/23/2015, 04/10/2015  . Pneumococcal Polysaccharide-23 02/06/2009  . Td 10/11/2010  . Zoster 01/08/2012   Pertinent  Health Maintenance Due  Topic Date Due  . FOOT EXAM  10/07/1932  . INFLUENZA VACCINE  08/06/2017  . HEMOGLOBIN A1C  12/18/2017  . OPHTHALMOLOGY EXAM  02/27/2018  . DEXA SCAN  Completed  . PNA vac Low Risk Adult  Completed   Fall Risk  08/27/2016 06/12/2015 06/12/2015 03/13/2015 01/09/2015  Falls in the past year? No No No Yes No  Number falls in past yr: - - - 1 -  Injury with Fall? - - - No -   Functional Status Survey:    Vitals:   07/20/17 1339  BP: 132/62  Pulse: 71  Resp: 16  Temp: 97.6 F (36.4 C)  TempSrc: Oral  SpO2: 99%  Weight: 156 lb 6.4 oz (70.9 kg)  Height: 5\' 5"  (1.651 m)   Body mass index is 26.03 kg/m.   Wt Readings from Last 3 Encounters:  07/20/17 156 lb  6.4 oz (70.9 kg)  06/19/17 156 lb (70.8 kg)  06/15/17 156 lb (70.8 kg)   Physical Exam  Constitutional: No distress.  Overweight elderly female  HENT:  Head: Normocephalic and atraumatic.  Right Ear: External ear normal.  Left Ear: External ear normal.  Nose: Nose normal.  Mouth/Throat: Oropharynx is clear and moist. No oropharyngeal exudate.  Minimal cerumen to both ears  Eyes: Pupils are equal, round, and reactive to light. Conjunctivae are normal.  Neck: Normal range of motion. Neck supple.  Cardiovascular: Normal rate and regular rhythm.  Pulmonary/Chest: Effort normal. No respiratory distress. She has wheezes. She has no rales. She exhibits no tenderness.  Poor air movement  Abdominal: Soft.  Bowel sounds are normal. There is no tenderness. There is no rebound and no guarding.  Genitourinary:  Genitourinary Comments: No suprapubic or CVA tenderness  Musculoskeletal: Normal range of motion. She exhibits no edema.  Can move all 4 extremities, unsteady gait, uses walker for ambulation  Lymphadenopathy:    She has no cervical adenopathy.  Neurological: She is alert.  Oriented to person and place, poor short term memory  Skin: Skin is warm and dry. She is not diaphoretic.  Psychiatric: She has a normal mood and affect.  Pleasantly confused    Labs reviewed: Recent Labs    03/05/17 03/30/17 06/18/17  NA 139  139 138 141  141  K 4.2  4.2 4.3 4.5  4.5  CL 103 103 102  CO2 27 29 29   BUN 29* 27* 27*  CREATININE 1.3*  1.32 1.2* 1.3*  1.26  CALCIUM 9.4  9.4 9.4 9.2  9.2   Recent Labs    03/05/17 06/18/17  AST 13  13 13   ALT 12  12 16   ALKPHOS 60  60 64  BILITOT 0.4 0.4  PROT 6.1  6.1 6.4  6.4  ALBUMIN 3.8  3.8 3.9  3.9   Recent Labs    11/06/16 03/05/17 06/18/17  WBC 9.8  --  8.0  8  NEUTROABS 6,145  --  4,016  HGB 11.6* 11.0 11.8*  11.8  HCT 34*  --  35*  34.7  MCV  --   --  92.3  PLT 241  --  261   Lab Results  Component Value Date   TSH 5.10 06/18/2017   TSH 5.10 06/18/2017   Lab Results  Component Value Date   HGBA1C 7.7 06/18/2017   Lab Results  Component Value Date   CHOL 172 06/18/2017   CHOL 172 06/18/2017   HDL 41 06/18/2017   LDLCALC 100 06/18/2017   LDLCALC 100 06/18/2017   TRIG 193 06/18/2017   TRIG 193 (A) 06/18/2017     Significant Diagnostic Results in last 30 days:  No results found.  Assessment/Plan  1. Urinary incontinence, unspecified type Perineal care. D/c hctz for now. Start mirabegron and monitor - mirabegron ER (MYRBETRIQ) 25 MG TB24 tablet; Take 1 tablet (25 mg total) by mouth daily.  Dispense: 30 tablet; Refill: 0  2. Cough Concern for reflux symptom along with ILD. Start omeprazole 20 mg daily and  duoneb bid for now. If no improvement, consider d/c of ACEI.  - ipratropium-albuterol (DUONEB) 0.5-2.5 (3) MG/3ML SOLN; Take 3 mLs by nebulization 2 (two) times daily.  Dispense: 360 mL; Refill: 0  3. Gastroesophageal reflux disease without esophagitis - omeprazole (PRILOSEC) 20 MG capsule; Take 1 capsule (20 mg total) by mouth daily.  Dispense: 30 capsule; Refill: 0  4. Essential hypertension Continue  amlodipine and lisinopril current regimen, d/c hctz with her urinary incontinence. bp bid x 2 weeks - lisinopril (PRINIVIL,ZESTRIL) 10 MG tablet; Take 1 tablet (10 mg total) by mouth daily.  Dispense: 90 tablet; Refill: 3  5. Type 2 diabetes mellitus with stage 3 chronic kidney disease, without long-term current use of insulin ([place]) Continue tradjenta and monitor renal function and blood sugar  6. Chronic interstitial lung disease ([place]) Noted on chest xray, start duoneb with her wheezing.  - ipratropium-albuterol (DUONEB) 0.5-2.5 (3) MG/3ML SOLN; Take 3 mLs by nebulization 2 (two) times daily.  Dispense: 360 mL; Refill: 0    Family/ staff Communication: reviewed care plan with patient and charge nurse.    Labs/tests ordered:  None  F/u in 1 week in patient's room  Blanchie Serve, MD Internal Medicine [facility] [place], [place] [address] Cell Phone (Monday-Friday 8 am - 5 pm): [phone] On Call: [phone] and follow prompts after 5 pm and on weekends Office Phone: [phone] Office Fax: [phone]

## 2017-08-05 ENCOUNTER — Non-Acute Institutional Stay: Payer: Medicare Other | Admitting: Family

## 2017-08-05 ENCOUNTER — Encounter: Payer: Self-pay | Admitting: Family

## 2017-08-05 DIAGNOSIS — R609 Edema, unspecified: Secondary | ICD-10-CM | POA: Diagnosis not present

## 2017-08-05 DIAGNOSIS — J849 Interstitial pulmonary disease, unspecified: Secondary | ICD-10-CM

## 2017-08-05 DIAGNOSIS — I10 Essential (primary) hypertension: Secondary | ICD-10-CM | POA: Diagnosis not present

## 2017-08-05 DIAGNOSIS — K219 Gastro-esophageal reflux disease without esophagitis: Secondary | ICD-10-CM

## 2017-08-05 NOTE — Addendum Note (Signed)
Addended byMarlowe Sax C on: 08/05/2017 03:50 PM   Modules accepted: Level of Service

## 2017-08-05 NOTE — Progress Notes (Addendum)
Location:  [facility] Room Number: 17 Place of Service:  ALF [phone]) Provider: Dinah Ngetich FNP-C  Blanchie Serve, MD  Patient Care Team: Blanchie Serve, MD as PCP - General (Internal Medicine) McKenzie, Lilia Argue, MD (Inactive) (Anesthesiology) Estill Dooms, MD as Consulting Physician (Internal Medicine) Kristeen Miss, MD as Consulting Physician (Neurosurgery) Shon Hough, MD as Consulting Physician (Ophthalmology) [name], [facility] ([place] and [place]) [name], MD as Consulting Physician (Dermatology) Ngetich, Nelda Bucks, NP as Nurse Practitioner (Family Medicine)  Extended Emergency Contact Information Primary Emergency Contact: [name] Address: [address]          [name] Phone: [phone] Mobile Phone: [phone] Relation: Son Secondary Emergency Contact: Tequita, [place] Mobile Phone: [phone] Relation: Daughter Preferred language: English Interpreter needed? No  Code Status:  DNR Goals of care: Advanced Directive information Advanced Directives 08/05/2017  Does Patient Have a Medical Advance Directive? Yes  Type of Paramedic of [place];Living will;Out of facility DNR (pink MOST or yellow form)  Does patient want to make changes to medical advance directive? No - Patient declined  Copy of [place] in Chart? Yes  Would patient like information on creating a medical advance directive? -  Pre-existing out of facility DNR order (yellow form or pink MOST form) Yellow form placed in chart (order not valid for inpatient use)     Chief Complaint  Patient presents with  . Acute Visit    Cough, wheezing and shortness of breath     HPI:  Pt is a 84 y.o. female seen today at [facility] for an acute visit for evaluation of cough,wheezing and shortness of breath.she is seen in her room today with facility  charge nurse present at bedside.Nurse reports patient had shortness of breath on exertion,dry cough and wheezing.Duoneb treatment ordered with much improvement. Patient denies any cough,wheezing or shortness of breath during visit though HPI and ROS limited due to patient's cognitive impairment.she also denies any fever or chills.Nurse reports patient's vital signs stable.   Past Medical History:  Diagnosis Date  . Allergic rhinitis   . Asthma   . Carpal tunnel syndrome 1980  . Chronic low back pain   . Constipation 06/27/2014  . GERD (gastroesophageal reflux disease)   . Hearing loss    Hearing aids  . Hyperglycemia 06/27/2014  . Hyperlipidemia 06/27/2014  . Hypertension   . Insomnia 06/27/2014  . Left leg DVT ([place])    chronic since 2008  . Memory loss 06/27/2014  . Meningioma ([place]) 2000   Dr. Ellene Route  . Osteopenia    Past Surgical History:  Procedure Laterality Date  . BACK SURGERY  1999   ruptured disk  . BRAIN TUMOR EXCISION  2000   Dr. Ellene Route  . CARPAL TUNNEL RELEASE  1980  . TEAR DUCT PROBING Bilateral 2014   Saluidon    No Known Allergies  Outpatient Encounter Medications as of 08/05/2017  Medication Sig  . amLODipine-atorvastatin (CADUET) 5-10 MG tablet TAKE ONE TABLET DAILY FOR BLOOD PRESSURE  . docusate sodium (COLACE) 100 MG capsule Take 100 mg by mouth daily.   Marland Kitchen donepezil (ARICEPT) 10 MG tablet Take 5 mg by mouth at bedtime.   . fluticasone (FLONASE) 50 MCG/ACT nasal spray Place 2 sprays into both nostrils daily as needed.   Marland Kitchen ipratropium-albuterol (DUONEB) 0.5-2.5 (3) MG/3ML SOLN Take 3 mLs by nebulization every 4 (four) hours as needed.  Marland Kitchen  levothyroxine (SYNTHROID, LEVOTHROID) 75 MCG tablet Take 75 mcg by mouth daily before breakfast.  . linagliptin (TRADJENTA) 5 MG TABS tablet Take 5 mg by mouth daily.  Marland Kitchen lisinopril (PRINIVIL,ZESTRIL) 10 MG tablet Take 1 tablet (10 mg total) by mouth daily.  . memantine (NAMENDA) 10 MG tablet Take 5 mg by mouth 2 (two) times  daily.   . mirabegron ER (MYRBETRIQ) 25 MG TB24 tablet Take 1 tablet (25 mg total) by mouth daily.  . Multiple Vitamins-Minerals (ICAPS) CAPS Take 1 capsule by mouth 2 (two) times daily. For eyes  . omeprazole (PRILOSEC) 20 MG capsule Take 1 capsule (20 mg total) by mouth daily.  . [DISCONTINUED] ipratropium-albuterol (DUONEB) 0.5-2.5 (3) MG/3ML SOLN Take 3 mLs by nebulization 2 (two) times daily.   No facility-administered encounter medications on file as of 08/05/2017.     Review of Systems  Constitutional: Negative for appetite change, chills, fatigue, fever and unexpected weight change.  HENT: Negative for congestion, rhinorrhea, sinus pressure, sinus pain, sneezing and sore throat.   Eyes: Negative for discharge, redness and itching.  Respiratory: Negative for chest tightness.        Dry cough,wheezes and shortness of breath reported by Nurse.  Cardiovascular: Positive for leg swelling. Negative for chest pain and palpitations.  Gastrointestinal: Negative for abdominal distention, abdominal pain, constipation, diarrhea, nausea and vomiting.  Musculoskeletal: Positive for gait problem.  Skin: Negative for color change, pallor and rash.  Neurological: Negative for dizziness, light-headedness and headaches.  Psychiatric/Behavioral: Negative for agitation and sleep disturbance. The patient is not nervous/anxious.        Confused at her baseline     Immunization History  Administered Date(s) Administered  . Influenza-Unspecified 10/20/2013, 10/05/2014, 10/18/2015, 10/15/2016  . PPD Test 10/10/2013, 03/23/2015, 04/10/2015  . Pneumococcal Polysaccharide-23 02/06/2009  . Td 10/11/2010  . Zoster 01/08/2012   Pertinent  Health Maintenance Due  Topic Date Due  . FOOT EXAM  10/07/1932  . INFLUENZA VACCINE  08/06/2017  . HEMOGLOBIN A1C  12/18/2017  . OPHTHALMOLOGY EXAM  02/27/2018  . DEXA SCAN  Completed  . PNA vac Low Risk Adult  Completed   Fall Risk  08/27/2016 06/12/2015 06/12/2015  03/13/2015 01/09/2015  Falls in the past year? No No No Yes No  Number falls in past yr: - - - 1 -  Injury with Fall? - - - No -   Functional Status Survey:    Vitals:   08/05/17 1131  BP: (!) 134/57  Pulse: 88  Resp: (!) 21  Temp: 98 F (36.7 C)  TempSrc: Oral  SpO2: 95%  Weight: 159 lb 6.4 oz (72.3 kg)  Height: 5\' 4"  (1.626 m)   Body mass index is 27.36 kg/m. Physical Exam  Constitutional:   Elderly in no acute distress   HENT:  Head: Normocephalic.  Mouth/Throat: Oropharynx is clear and moist. No oropharyngeal exudate.  Eyes: Pupils are equal, round, and reactive to light. Conjunctivae and EOM are normal. Right eye exhibits no discharge. Left eye exhibits no discharge. No scleral icterus.  Neck: Normal range of motion. No JVD present. No thyromegaly present.  Cardiovascular: Normal rate, regular rhythm, normal heart sounds and intact distal pulses. Exam reveals no gallop and no friction rub.  No murmur heard. Pulmonary/Chest: Effort normal. No respiratory distress. She has no rales.  Bilateral lung expiration wheezes  Abdominal: Soft. Bowel sounds are normal. She exhibits no distension and no mass. There is no tenderness. There is no rebound and no guarding.  Musculoskeletal:  She exhibits no tenderness.  Unsteady gait ambulates with Front wheel walker.Bilateral knee high ted hose in place trace edema noted.   Lymphadenopathy:    She has no cervical adenopathy.  Neurological: Gait abnormal.  Pleasantly confused at her baseline   Skin: Skin is warm and dry. Capillary refill takes 2 to 3 seconds. No rash noted. No erythema. No pallor.  Psychiatric: She has a normal mood and affect. Her speech is normal and behavior is normal. Judgment and thought content normal. Cognition and memory are impaired.  Nursing note and vitals reviewed.   Labs reviewed: Recent Labs    03/05/17 03/30/17 06/18/17  NA 139  139 138 141  141  K 4.2  4.2 4.3 4.5  4.5  CL 103 103 102  CO2 27 29  29   BUN 29* 27* 27*  CREATININE 1.3*  1.32 1.2* 1.3*  1.26  CALCIUM 9.4  9.4 9.4 9.2  9.2   Recent Labs    03/05/17 06/18/17  AST 13  13 13   ALT 12  12 16   ALKPHOS 60  60 64  BILITOT 0.4 0.4  PROT 6.1  6.1 6.4  6.4  ALBUMIN 3.8  3.8 3.9  3.9   Recent Labs    11/06/16 03/05/17 06/18/17  WBC 9.8  --  8.0  8  NEUTROABS 6,145  --  4,016  HGB 11.6* 11.0 11.8*  11.8  HCT 34*  --  35*  34.7  MCV  --   --  92.3  PLT 241  --  261   Lab Results  Component Value Date   TSH 5.10 06/18/2017   TSH 5.10 06/18/2017   Lab Results  Component Value Date   HGBA1C 7.7 06/18/2017   Lab Results  Component Value Date   CHOL 172 06/18/2017   CHOL 172 06/18/2017   HDL 41 06/18/2017   LDLCALC 100 06/18/2017   LDLCALC 100 06/18/2017   TRIG 193 06/18/2017   TRIG 193 (A) 06/18/2017    Significant Diagnostic Results in last 30 days:  No results found.  Assessment/Plan  Chronic lung disease  Afebrile.bilateral lung expiration wheezes noted.No shortness of breath noted.start on Duoneb 0.5-2.5 (3)mg/3 ml inhale 3 mls via nebulizer every 4 hours as needed for cough,shortness of breath or wheezing.continue on cough syrup Robitussin as needed for cough.Continue to monitor.  HTN B/p reviewed and results discussed with patient's POA readings in the 100's/50's-150's/70's with occational SBP 160's-170's.continue on Caduet 5-10 mg tablet daily and lisinopril 10 mg tablet.continue to monitor.   Edema    No abrupt weight changes since HZCT was discontinued by MD on 07/20/2017.Bilateral lower extremities trace edema.continue on knee high ted hose on in the morning and off at bedtime.continue to monitor weight.  GERD  Asymptomatic.continue on Omeprazole 20 mg capsule daily. monitor mg and vit B12 level.   Family/ staff Communication: Reviewed plan of care with patient,patient's POA Verdie Shire and facility charge Nurse  Labs/tests ordered: None   Addendum: Patient's Daughter in [employer] call returned per Dr.Pandey''s Spoke with patient's POA Verdie Shire on the phone with facility charge Nurse present.POA wanted to follow up on patient's B/P,leg edema,GERD and urine incontinence since MD discontinued her HZCT and started on mirabegron 25 mg tablet daily and omeprazole.POA updated that Patient has had no abrupt weight changes or worsening edema since recent MD visit on 07/20/2017.Nurse reports no issues with acid reflux.Nurse states incontinence has not improved but patient doesn't void on the floor since starting  on Mirabegron.     Sandrea Hughs, NP

## 2017-08-14 ENCOUNTER — Telehealth: Payer: Self-pay

## 2017-08-14 ENCOUNTER — Non-Acute Institutional Stay: Payer: Medicare Other | Admitting: Family

## 2017-08-14 ENCOUNTER — Encounter: Payer: Self-pay | Admitting: Family

## 2017-08-14 DIAGNOSIS — I10 Essential (primary) hypertension: Secondary | ICD-10-CM

## 2017-08-14 DIAGNOSIS — R32 Unspecified urinary incontinence: Secondary | ICD-10-CM

## 2017-08-14 NOTE — Progress Notes (Signed)
Location:  [facility] Room Number: 17 Place of Service:  ALF [phone]) Provider: Deniz Hannan FNP-C  Kayla Serve, MD  Patient Care Team: Kayla Serve, MD as PCP - General (Internal Medicine) McKenzie, Kayla Argue, MD (Inactive) (Anesthesiology) Estill Dooms, MD as Consulting Physician (Internal Medicine) Kristeen Miss, MD as Consulting Physician (Neurosurgery) Shon Hough, MD as Consulting Physician (Ophthalmology) [name], [facility] ([place] [facility] [place]) [name], MD as Consulting Physician (Dermatology) Vallen Calabrese, Nelda Bucks, NP as Nurse Practitioner (Family Medicine)  Extended Emergency Contact Information Primary Emergency Contact: Kayla Weaver Address: [address]          [name] Phone: [phone] Mobile Phone: [phone] Relation: Son Secondary Emergency Contact: [name], [name] Mobile Phone: [phone] Relation: Daughter Preferred language: English Interpreter needed? No  Code Status:  DNR Goals of care: Advanced Directive information Advanced Directives 08/14/2017  Does Patient Have a Medical Advance Directive? Yes  Type of Paramedic of [place];Out of facility DNR (pink MOST or yellow form);Living will  Does patient want to make changes to medical advance directive? -  Copy of [place] in Chart? Yes  Would patient like information on creating a medical advance directive? -  Pre-existing out of facility DNR order (yellow form or pink MOST form) Yellow form placed in chart (order not valid for inpatient use)     Chief Complaint  Patient presents with  . Acute Visit    medication management    HPI:  Pt is a 83 y.o. female seen today at [facility] for an acute visit for evaluation of medication per patient's POA Kayla Weaver is seen in her room today with facility nurse present at bedside.Patient's  POA called provider's office requested Kayla Weaver tablet to be discontinued.POA states medication not helping with patient's incontinence and has also noticed slight confusion in patient.POA also states spoke with son who is a pharmacist recommended Kayla Weaver to be discontinued.patient denies any fever,chills or signs and symptoms of urinary tract infections.Facility Nurse reports no agitation and pleasantly confused at her baseline.     Past Medical History:  Diagnosis Date  . Allergic rhinitis   . Asthma   . Carpal tunnel syndrome 1980  . Chronic low back pain   . Constipation 06/27/2014  . GERD (gastroesophageal reflux disease)   . Hearing loss    Hearing aids  . Hyperglycemia 06/27/2014  . Hyperlipidemia 06/27/2014  . Hypertension   . Insomnia 06/27/2014  . Left leg DVT ([place])    chronic since 2008  . Memory loss 06/27/2014  . Meningioma ([place]) 2000   Dr. Ellene Route  . Osteopenia    Past Surgical History:  Procedure Laterality Date  . BACK SURGERY  1999   ruptured disk  . BRAIN TUMOR EXCISION  2000   Dr. Ellene Route  . CARPAL TUNNEL RELEASE  1980  . TEAR DUCT PROBING Bilateral 2014   Saluidon    No Known Allergies  Outpatient Encounter Medications as of 08/14/2017  Medication Sig  . amLODipine-atorvastatin (CADUET) 5-10 MG tablet TAKE ONE TABLET DAILY FOR BLOOD PRESSURE  . docusate sodium (COLACE) 100 MG capsule Take 100 mg by mouth daily.   Marland Kitchen donepezil (ARICEPT) 10 MG tablet Take 5 mg by mouth at bedtime.   . fluticasone (FLONASE) 50 MCG/ACT nasal spray Place 2 sprays into both nostrils daily as needed.   Marland Kitchen ipratropium-albuterol (DUONEB) 0.5-2.5 (3) MG/3ML SOLN Take 3 mLs by  nebulization every 4 (four) hours as needed.  Marland Kitchen levothyroxine (SYNTHROID, LEVOTHROID) 75 MCG tablet Take 75 mcg by mouth daily before breakfast.  . linagliptin (TRADJENTA) 5 MG TABS tablet Take 5 mg by mouth daily.  Marland Kitchen lisinopril (PRINIVIL,ZESTRIL) 10 MG tablet Take 1 tablet (10 mg total) by mouth daily.  .  memantine (NAMENDA) 10 MG tablet Take 5 mg by mouth 2 (two) times daily.   . mirabegron ER (Kayla Weaver) 25 MG TB24 tablet Take 1 tablet (25 mg total) by mouth daily.  . Multiple Vitamins-Minerals (ICAPS) CAPS Take 1 capsule by mouth 2 (two) times daily. For eyes  . omeprazole (PRILOSEC) 20 MG capsule Take 1 capsule (20 mg total) by mouth daily.   No facility-administered encounter medications on file as of 08/14/2017.     Review of Systems  Unable to perform ROS: Dementia (additional information provided bt facility nurse)  Constitutional: Negative for appetite change, chills, fatigue and fever.  Respiratory: Negative for cough and shortness of breath.        On DuoNeb treatment for chronic wheezing  Gastrointestinal: Negative for abdominal distention, abdominal pain, constipation, diarrhea, nausea and vomiting.  Genitourinary: Negative for dysuria, flank pain and urgency.  Skin: Negative for color change, pallor and rash.  Neurological: Negative for dizziness, light-headedness and headaches.  Psychiatric/Behavioral: Negative for agitation, confusion and sleep disturbance. The patient is not nervous/anxious.     Immunization History  Administered Date(s) Administered  . Influenza-Unspecified 10/20/2013, 10/05/2014, 10/18/2015, 10/15/2016  . PPD Test 10/10/2013, 03/23/2015, 04/10/2015  . Pneumococcal Polysaccharide-23 02/06/2009  . Td 10/11/2010  . Zoster 01/08/2012   Pertinent  Health Maintenance Due  Topic Date Due  . FOOT EXAM  10/07/1932  . INFLUENZA VACCINE  08/06/2017  . HEMOGLOBIN A1C  12/18/2017  . OPHTHALMOLOGY EXAM  02/27/2018  . DEXA SCAN  Completed  . PNA vac Low Risk Adult  Completed   Fall Risk  08/27/2016 06/12/2015 06/12/2015 03/13/2015 01/09/2015  Falls in the past year? No No No Yes No  Number falls in past yr: - - - 1 -  Injury with Fall? - - - No -   Functional Status Survey:    Vitals:   08/14/17 1347  BP: (!) 160/74  Pulse: 85  Resp: 20  Temp: (!) 97.2 F  (36.2 C)  SpO2: 94%  Weight: 160 lb 12.8 oz (72.9 kg)  Height: 5\' 4"  (1.626 m)   Body mass index is 27.6 kg/m. Physical Exam  Constitutional: She appears well-developed and well-nourished.  Elderly in no acute distress  HENT:  Head: Normocephalic.  Mouth/Throat: Oropharynx is clear and moist. No oropharyngeal exudate.  Eyes: Pupils are equal, round, and reactive to light. Conjunctivae are normal. Right eye exhibits no discharge. Left eye exhibits no discharge. No scleral icterus.  Neck: Normal range of motion. No JVD present.  Cardiovascular: Normal rate, regular rhythm, normal heart sounds and intact distal pulses. Exam reveals no gallop and no friction rub.  No murmur heard. Pulmonary/Chest: Effort normal. No respiratory distress. She has no rales.  Diminished bilateral expiration wheezes   Abdominal: Soft. Bowel sounds are normal. She exhibits no distension and no mass. There is no tenderness. There is no rebound and no guarding.  Musculoskeletal: She exhibits no tenderness.  Unsteady gait ambulates with FWW but forgets it at times.bilateral lower extremities trace edema.Knee high ted hose in place.   Lymphadenopathy:    She has no cervical adenopathy.  Neurological:  Alert and oriented to person and place  Skin: Skin is warm and dry. No rash noted. No erythema. No pallor.  Psychiatric: She has a normal mood and affect. Her speech is normal and behavior is normal. Judgment and thought content normal. She exhibits abnormal recent memory.  Nursing note and vitals reviewed.   Labs reviewed: Recent Labs    03/05/17 03/30/17 06/18/17  NA 139  139 138 141  141  K 4.2  4.2 4.3 4.5  4.5  CL 103 103 102  CO2 27 29 29   BUN 29* 27* 27*  CREATININE 1.3*  1.32 1.2* 1.3*  1.26  CALCIUM 9.4  9.4 9.4 9.2  9.2   Recent Labs    03/05/17 06/18/17  AST 13  13 13   ALT 12  12 16   ALKPHOS 60  60 64  BILITOT 0.4 0.4  PROT 6.1  6.1 6.4  6.4  ALBUMIN 3.8  3.8 3.9  3.9    Recent Labs    11/06/16 03/05/17 06/18/17  WBC 9.8  --  8.0  8  NEUTROABS 6,145  --  4,016  HGB 11.6* 11.0 11.8*  11.8  HCT 34*  --  35*  34.7  MCV  --   --  92.3  PLT 241  --  261   Lab Results  Component Value Date   TSH 5.10 06/18/2017   TSH 5.10 06/18/2017   Lab Results  Component Value Date   HGBA1C 7.7 06/18/2017   Lab Results  Component Value Date   CHOL 172 06/18/2017   CHOL 172 06/18/2017   HDL 41 06/18/2017   LDLCALC 100 06/18/2017   LDLCALC 100 06/18/2017   TRIG 193 06/18/2017   TRIG 193 (A) 06/18/2017    Significant Diagnostic Results in last 30 days:  No results found.  Assessment/Plan 1. Urinary incontinence,  Kayla Weaver initiated 07/20/2017 by MD ineffective per POA with slight confusion reported.will discontinue Kayla Weaver per POA request.continue to peri-care to prevent skin breakdown.continue to monitor.   2. Essential hypertension Latest SBP elevated 160.Blood pressure reviewed overall blood pressure readings in the 130's/50's-150's/60's.Kayla Weaver could be contributing to elevated readings.continue current medication and discontinue Kayla Weaver as above.continue to monitor B/p.   Family/ staff Communication: Reviewed plan of care with patient and facility charge Nurse   Labs/tests ordered: None   Sandrea Hughs, NP

## 2017-08-14 NOTE — Telephone Encounter (Signed)
Will evaluate patient for confusion today then discontinue myrbetriq per family request.

## 2017-08-14 NOTE — Telephone Encounter (Signed)
Will forward to [place]. Dr Bubba Camp not in office today

## 2017-08-14 NOTE — Telephone Encounter (Signed)
Message left on clinical intake voicemail:   Mardene Celeste (daughter-in-law) left message requesting return call. Patient was started on a medication for bladder control, patient now with confusion.   I called Mardene Celeste back, patient was placed on Myrbetriq. Myrbetriq is not helping that much and patient with slight confusion (no specific incident). Patient not as keen. Mardene Celeste thinks patient needs to d/c medication. Patricia's son whom is a pharmacist thinks patient should d/c medication as well.   Mardene Celeste would like a call back with Dr.Pandey's response/plan of care

## 2017-08-19 ENCOUNTER — Encounter: Payer: Self-pay | Admitting: Internal Medicine

## 2017-08-26 ENCOUNTER — Encounter: Payer: Medicare Other | Admitting: Internal Medicine

## 2017-08-31 ENCOUNTER — Encounter: Payer: Self-pay | Admitting: Internal Medicine

## 2017-08-31 ENCOUNTER — Non-Acute Institutional Stay: Payer: Medicare Other | Admitting: Internal Medicine

## 2017-08-31 VITALS — BP 136/76 | HR 74 | Temp 97.8°F | Resp 24 | Ht 64.0 in | Wt 160.2 lb

## 2017-08-31 DIAGNOSIS — R062 Wheezing: Secondary | ICD-10-CM | POA: Diagnosis not present

## 2017-08-31 DIAGNOSIS — J309 Allergic rhinitis, unspecified: Secondary | ICD-10-CM

## 2017-08-31 NOTE — Progress Notes (Signed)
[facility]  Provider: Blanchie Serve MD   Location:  [place] Room Number: 17 Place of Service:  Clinic (12)  PCP: Blanchie Serve, MD Patient Care Team: Blanchie Serve, MD as PCP - General (Internal Medicine) McKenzie, Lilia Argue, MD (Inactive) (Anesthesiology) Estill Dooms, MD as Consulting Physician (Internal Medicine) Kristeen Miss, MD as Consulting Physician (Neurosurgery) Shon Hough, MD as Consulting Physician (Ophthalmology) [name], [facility] ([place]) [name], MD as Consulting Physician (Dermatology) Ngetich, Nelda Bucks, NP as Nurse Practitioner (Family Medicine)  Extended Emergency Contact Information Primary Emergency Contact: [name] Address: [address]          [name] Phone: [phone] Mobile Phone: [phone] Relation: Son Secondary Emergency Contact: Schelly, [place] Mobile Phone: [phone] Relation: Daughter Preferred language: English Interpreter needed? No   Goals of Care: Advanced Directive information Advanced Directives 08/31/2017  Does Patient Have a Medical Advance Directive? Yes  Type of Paramedic of [place];Out of facility DNR (pink MOST or yellow form);Living will  Does patient want to make changes to medical advance directive? -  Copy of [place] in Chart? Yes  Would patient like information on creating a medical advance directive? -  Pre-existing out of facility DNR order (yellow form or pink MOST form) Yellow form placed in chart (order not valid for inpatient use)      Chief Complaint  Patient presents with  . Acute Visit    cough--asks about flonase and nebulizer    HPI: Patient is a 84 y.o. female seen today for acute visit. She is here with her son, daughter in law and caregiver. She denies any complaint. She is pleasantly confused. Family  and caregiver have noticed some cough with occasional yellow phlegm and increased wheezing. During the conversation, I here patient to be wheezing.   Past Medical History:  Diagnosis Date  . Allergic rhinitis   . Asthma   . Carpal tunnel syndrome 1980  . Chronic low back pain   . Constipation 06/27/2014  . GERD (gastroesophageal reflux disease)   . Hearing loss    Hearing aids  . Hyperglycemia 06/27/2014  . Hyperlipidemia 06/27/2014  . Hypertension   . Insomnia 06/27/2014  . Left leg DVT ([place])    chronic since 2008  . Memory loss 06/27/2014  . Meningioma ([place]) 2000   Dr. Ellene Route  . Osteopenia    Past Surgical History:  Procedure Laterality Date  . BACK SURGERY  1999   ruptured disk  . BRAIN TUMOR EXCISION  2000   Dr. Ellene Route  . CARPAL TUNNEL RELEASE  1980  . TEAR DUCT PROBING Bilateral 2014   Saluidon    reports that she has never smoked. She has never used smokeless tobacco. She reports that she does not drink alcohol or use drugs. Social History   Socioeconomic History  . Marital status: Unknown    Spouse name: Not on file  . Number of children: Not on file  . Years of education: Not on file  . Highest education level: Not on file  Occupational History  . Occupation: housewife  Social Needs  . Financial resource strain: Not on file  . Food insecurity:    Worry: Not on file    Inability: Not on file  . Transportation needs:    Medical: Not on file    Non-medical: Not on file  Tobacco Use  .  Smoking status: Never Smoker  . Smokeless tobacco: Never Used  Substance and Sexual Activity  . Alcohol use: No  . Drug use: No  . Sexual activity: Never  Lifestyle  . Physical activity:    Days per week: Not on file    Minutes per session: Not on file  . Stress: Not on file  Relationships  . Social connections:    Talks on phone: Not on file    Gets together: Not on file    Attends religious service: Not on file    Active member of club or organization: Not on file      Attends meetings of clubs or organizations: Not on file    Relationship status: Not on file  . Intimate partner violence:    Fear of current or ex partner: Not on file    Emotionally abused: Not on file    Physically abused: Not on file    Forced sexual activity: Not on file  Other Topics Concern  . Not on file  Social History Narrative   Lives at [facility] since 12/05/2010, moved to [place] 03/23/15   Widowed   Never smoked   Alcohol none   Exercise -walking daily, walks with walker    Living Will, POA     Family History  Problem Relation Age of Onset  . CVA Mother   . Cancer Father        prostate     Health Maintenance  Topic Date Due  . FOOT EXAM  10/07/1932  . INFLUENZA VACCINE  08/06/2017  . HEMOGLOBIN A1C  12/18/2017  . OPHTHALMOLOGY EXAM  02/27/2018  . TETANUS/TDAP  10/10/2020  . DEXA SCAN  Completed  . PNA vac Low Risk Adult  Completed    No Known Allergies  Outpatient Encounter Medications as of 08/31/2017  Medication Sig  . amLODipine-atorvastatin (CADUET) 5-10 MG tablet TAKE ONE TABLET DAILY FOR BLOOD PRESSURE  . docusate sodium (COLACE) 100 MG capsule Take 100 mg by mouth daily.   Marland Kitchen donepezil (ARICEPT) 10 MG tablet Take 5 mg by mouth at bedtime.   . fluticasone (FLONASE) 50 MCG/ACT nasal spray Place 2 sprays into both nostrils daily as needed.   Marland Kitchen ipratropium-albuterol (DUONEB) 0.5-2.5 (3) MG/3ML SOLN Take 3 mLs by nebulization every 4 (four) hours as needed.  Marland Kitchen levothyroxine (SYNTHROID, LEVOTHROID) 75 MCG tablet Take 75 mcg by mouth daily before breakfast.  . linagliptin (TRADJENTA) 5 MG TABS tablet Take 5 mg by mouth daily.  Marland Kitchen lisinopril (PRINIVIL,ZESTRIL) 10 MG tablet Take 1 tablet (10 mg total) by mouth daily.  . memantine (NAMENDA) 10 MG tablet Take 5 mg by mouth 2 (two) times daily.   . Multiple Vitamins-Minerals (ICAPS) CAPS Take 1 capsule by mouth 2 (two) times daily. For eyes  . omeprazole (PRILOSEC) 20 MG capsule Take 1 capsule (20 mg  total) by mouth daily.  . [DISCONTINUED] mirabegron ER (MYRBETRIQ) 25 MG TB24 tablet Take 1 tablet (25 mg total) by mouth daily. (Patient not taking: Reported on 08/31/2017)   No facility-administered encounter medications on file as of 08/31/2017.     Review of Systems  Constitutional: Negative for appetite change, chills and fever.  HENT: Positive for rhinorrhea. Negative for congestion, ear pain, mouth sores, sinus pressure, sinus pain, sneezing, sore throat and trouble swallowing.   Eyes: Negative for pain, discharge and itching.  Respiratory: Positive for cough and wheezing. Negative for shortness of breath.   Cardiovascular: Positive for leg swelling. Negative for  chest pain and palpitations.  Gastrointestinal: Negative for abdominal pain, nausea and vomiting.  Genitourinary: Negative for dysuria and flank pain.  Musculoskeletal: Positive for gait problem. Negative for back pain.  Skin: Negative for rash.  Neurological: Negative for dizziness and headaches.  Psychiatric/Behavioral: Positive for confusion.    Vitals:   08/31/17 1508  BP: 136/76  Pulse: 74  Resp: (!) 24  Temp: 97.8 F (36.6 C)  TempSrc: Oral  SpO2: 90%  Weight: 160 lb 3.2 oz (72.7 kg)  Height: 5\' 4"  (1.626 m)   Body mass index is 27.5 kg/m.   Wt Readings from Last 3 Encounters:  08/31/17 160 lb 3.2 oz (72.7 kg)  08/14/17 160 lb 12.8 oz (72.9 kg)  08/05/17 159 lb 6.4 oz (72.3 kg)   Physical Exam  Constitutional: She appears well-developed. No distress.  overweight  HENT:  Head: Normocephalic and atraumatic.  Mouth/Throat: Oropharynx is clear and moist. No oropharyngeal exudate.  Eyes: Pupils are equal, round, and reactive to light. Conjunctivae and EOM are normal. Right eye exhibits no discharge. Left eye exhibits no discharge.  Neck: Normal range of motion. Neck supple.  Cardiovascular: Normal rate and regular rhythm.  Pulmonary/Chest: No respiratory distress. She has wheezes. She has no rales. She  exhibits no tenderness.  Effort normal, poor air movement  Abdominal: Soft. Bowel sounds are normal. There is no tenderness. There is no guarding.  Musculoskeletal: Normal range of motion. She exhibits edema.  Lymphadenopathy:    She has no cervical adenopathy.  Neurological: She is alert.  Oriented to person only  Skin: Skin is warm and dry. She is not diaphoretic.    Labs reviewed: Basic Metabolic Panel: Recent Labs    03/05/17 03/30/17 06/18/17  NA 139  139 138 141  141  K 4.2  4.2 4.3 4.5  4.5  CL 103 103 102  CO2 27 29 29   BUN 29* 27* 27*  CREATININE 1.3*  1.32 1.2* 1.3*  1.26  CALCIUM 9.4  9.4 9.4 9.2  9.2   Liver Function Tests: Recent Labs    03/05/17 06/18/17  AST 13  13 13   ALT 12  12 16   ALKPHOS 60  60 64  BILITOT 0.4 0.4  PROT 6.1  6.1 6.4  6.4  ALBUMIN 3.8  3.8 3.9  3.9   No results for input(s): LIPASE, AMYLASE in the last 8760 hours. No results for input(s): AMMONIA in the last 8760 hours. CBC: Recent Labs    11/06/16 03/05/17 06/18/17  WBC 9.8  --  8.0  8  NEUTROABS 6,145  --  4,016  HGB 11.6* 11.0 11.8*  11.8  HCT 34*  --  35*  34.7  MCV  --   --  92.3  PLT 241  --  261   Cardiac Enzymes: No results for input(s): CKTOTAL, CKMB, CKMBINDEX, TROPONINI in the last 8760 hours. BNP: Invalid input(s): POCBNP Lab Results  Component Value Date   HGBA1C 7.7 06/18/2017   Lab Results  Component Value Date   TSH 5.10 06/18/2017   TSH 5.10 06/18/2017   No results found for: VITAMINB12 No results found for: FOLATE No results found for: IRON, TIBC, FERRITIN  Lipid Panel: Recent Labs    09/15/16 06/18/17  CHOL 166 172  172  HDL 47 41  LDLCALC 93 100  100  TRIG 157 193*  193   Lab Results  Component Value Date   HGBA1C 7.7 06/18/2017    Procedures since last visit: No results  found.  Assessment/Plan  1. Wheezing Old chest xray s/o COPD changes. History of smoking. duoneb tid for now besides prn order. Reassess in few  days, if no improvement consider chest xray. Continue PPI.   2. Allergic rhinitis, unspecified seasonality, unspecified trigger flonase daily for now and monitor    Labs/tests ordered:  none  Next appointment: 1 week in room and 4 weeks in clinic  Communication: reviewed care plan with patient, her family and charge nurse.     Blanchie Serve, MD Internal Medicine [facility] [address] [place], [place] [address] Cell Phone (Monday-Friday 8 am - 5 pm): [phone] On Call: [phone] and follow prompts after 5 pm and on weekends Office Phone: [phone] Office Fax: [phone]

## 2017-09-07 ENCOUNTER — Non-Acute Institutional Stay: Payer: Medicare Other | Admitting: Internal Medicine

## 2017-09-07 DIAGNOSIS — J309 Allergic rhinitis, unspecified: Secondary | ICD-10-CM

## 2017-09-07 DIAGNOSIS — J439 Emphysema, unspecified: Secondary | ICD-10-CM | POA: Diagnosis not present

## 2017-09-07 NOTE — Progress Notes (Signed)
Location:  [place] of Service:  ALF [phone]) Provider:  Blanchie Serve, MD  Blanchie Serve, MD  Patient Care Team: Blanchie Serve, MD as PCP - General (Internal Medicine) McKenzie, Lilia Argue, MD (Inactive) (Anesthesiology) Estill Dooms, MD as Consulting Physician (Internal Medicine) Kristeen Miss, MD as Consulting Physician (Neurosurgery) Shon Hough, MD as Consulting Physician (Ophthalmology) [name], [facility] ([place] and [place]) [name], MD as Consulting Physician (Dermatology) Ngetich, Nelda Bucks, NP as Nurse Practitioner (Family Medicine)  Extended Emergency Contact Information Primary Emergency Contact: Kayla Weaver Address: [address]          [name] Phone: [phone] Mobile Phone: [phone] Relation: Son Secondary Emergency Contact: [name], Horn Mobile Phone: [phone] Relation: Daughter Preferred language: English Interpreter needed? No  Code Status:  DNR  Goals of care: Advanced Directive information Advanced Directives 08/31/2017  Does Patient Have a Medical Advance Directive? Yes  Type of Paramedic of [place];Out of facility DNR (pink MOST or yellow form);Living will  Does patient want to make changes to medical advance directive? -  Copy of [place] in Chart? Yes  Would patient like information on creating a medical advance directive? -  Pre-existing out of facility DNR order (yellow form or pink MOST form) Yellow form placed in chart (order not valid for inpatient use)     Chief Complaint  Patient presents with  . Acute Visit    follow up on wheezing    HPI:  Pt is a 84 y.o. female seen today for an acute visit for follow up on her wheezing for which she was seen in the clinic with her family present. Today pt is seen in her room with charge nurse present. She is pleasantly confused but denies any  acute concern. She mentions that her breathing is fine. Per nursing, she goes to dining area for meals and scheduled duoneb as helped with her breathing. No wheezing reported by nursing. No fever reported. Po intake is fair per staff. No cough reported.    Past Medical History:  Diagnosis Date  . Allergic rhinitis   . Asthma   . Carpal tunnel syndrome 1980  . Chronic low back pain   . Constipation 06/27/2014  . GERD (gastroesophageal reflux disease)   . Hearing loss    Hearing aids  . Hyperglycemia 06/27/2014  . Hyperlipidemia 06/27/2014  . Hypertension   . Insomnia 06/27/2014  . Left leg DVT ([place])    chronic since 2008  . Memory loss 06/27/2014  . Meningioma ([place]) 2000   Dr. Ellene Route  . Osteopenia    Past Surgical History:  Procedure Laterality Date  . BACK SURGERY  1999   ruptured disk  . BRAIN TUMOR EXCISION  2000   Dr. Ellene Route  . CARPAL TUNNEL RELEASE  1980  . TEAR DUCT PROBING Bilateral 2014   Saluidon    No Known Allergies  Outpatient Encounter Medications as of 09/07/2017  Medication Sig  . amLODipine-atorvastatin (CADUET) 5-10 MG tablet TAKE ONE TABLET DAILY FOR BLOOD PRESSURE  . docusate sodium (COLACE) 100 MG capsule Take 100 mg by mouth daily.   Marland Kitchen donepezil (ARICEPT) 10 MG tablet Take 5 mg by mouth at bedtime.   . fluticasone (FLONASE) 50 MCG/ACT nasal spray Place 2 sprays into both nostrils daily.  Marland Kitchen ipratropium-albuterol (DUONEB) 0.5-2.5 (3) MG/3ML SOLN Take 3 mLs by nebulization 3 (three) times daily. Three times a day    .  levothyroxine (SYNTHROID, LEVOTHROID) 75 MCG tablet Take 75 mcg by mouth daily before breakfast.  . linagliptin (TRADJENTA) 5 MG TABS tablet Take 5 mg by mouth daily.  Marland Kitchen lisinopril (PRINIVIL,ZESTRIL) 10 MG tablet Take 1 tablet (10 mg total) by mouth daily.  . memantine (NAMENDA) 10 MG tablet Take 5 mg by mouth 2 (two) times daily.   . Multiple Vitamins-Minerals (ICAPS) CAPS Take 1 capsule by mouth 2 (two) times daily. For eyes  . omeprazole  (PRILOSEC) 20 MG capsule Take 1 capsule (20 mg total) by mouth daily.   No facility-administered encounter medications on file as of 09/07/2017.     Review of Systems  Constitutional: Negative for appetite change, chills and fever.  HENT: Positive for hearing loss. Negative for congestion, mouth sores, rhinorrhea and sinus pressure.   Respiratory: Negative for cough, shortness of breath and wheezing.   Cardiovascular: Negative for chest pain and palpitations.  Gastrointestinal: Negative for abdominal pain.  Neurological: Negative for dizziness and headaches.  Psychiatric/Behavioral: Positive for confusion. Negative for agitation and behavioral problems.    Immunization History  Administered Date(s) Administered  . Influenza-Unspecified 10/20/2013, 10/05/2014, 10/18/2015, 10/15/2016  . PPD Test 10/10/2013, 03/23/2015, 04/10/2015  . Pneumococcal Polysaccharide-23 02/06/2009  . Td 10/11/2010  . Zoster 01/08/2012   Pertinent  Health Maintenance Due  Topic Date Due  . FOOT EXAM  10/07/1932  . INFLUENZA VACCINE  08/06/2017  . HEMOGLOBIN A1C  12/18/2017  . OPHTHALMOLOGY EXAM  02/27/2018  . DEXA SCAN  Completed  . PNA vac Low Risk Adult  Completed   Fall Risk  08/27/2016 06/12/2015 06/12/2015 03/13/2015 01/09/2015  Falls in the past year? No No No Yes No  Number falls in past yr: - - - 1 -  Injury with Fall? - - - No -   Functional Status Survey:    Vitals:   09/07/17 1031  BP: (!) 154/73  Pulse: 80  Resp: 18  Temp: 98.4 F (36.9 C)  SpO2: 98%  Weight: 160 lb 3.2 oz (72.7 kg)  Height: 5\' 4"  (1.626 m)   Body mass index is 27.5 kg/m. Physical Exam  Constitutional: She appears well-developed and well-nourished. No distress.  HENT:  Head: Normocephalic and atraumatic.  Mouth/Throat: Oropharynx is clear and moist.  Eyes: Conjunctivae are normal.  Neck: Normal range of motion. Neck supple.  Cardiovascular: Normal rate and regular rhythm.  Pulmonary/Chest: Effort normal. No  respiratory distress. She has no wheezes. She has no rales.  Poor air movement but no added sound  Abdominal: Soft. Bowel sounds are normal. There is no tenderness. There is no guarding.  Musculoskeletal: Normal range of motion. She exhibits edema.  Using walker for ambulation  Neurological: She exhibits normal muscle tone.  Pleasantly confused  Skin: Skin is warm and dry. She is not diaphoretic.  Psychiatric: She has a normal mood and affect.    Labs reviewed: Recent Labs    03/05/17 03/30/17 06/18/17  NA 139  139 138 141  141  K 4.2  4.2 4.3 4.5  4.5  CL 103 103 102  CO2 27 29 29   BUN 29* 27* 27*  CREATININE 1.3*  1.32 1.2* 1.3*  1.26  CALCIUM 9.4  9.4 9.4 9.2  9.2   Recent Labs    03/05/17 06/18/17  AST 13  13 13   ALT 12  12 16   ALKPHOS 60  60 64  BILITOT 0.4 0.4  PROT 6.1  6.1 6.4  6.4  ALBUMIN 3.8  3.8 3.9  3.9   Recent Labs    11/06/16 03/05/17 06/18/17  WBC 9.8  --  8.0  8  NEUTROABS 6,145  --  4,016  HGB 11.6* 11.0 11.8*  11.8  HCT 34*  --  35*  34.7  MCV  --   --  92.3  PLT 241  --  261   Lab Results  Component Value Date   TSH 5.10 06/18/2017   TSH 5.10 06/18/2017   Lab Results  Component Value Date   HGBA1C 7.7 06/18/2017   Lab Results  Component Value Date   CHOL 172 06/18/2017   CHOL 172 06/18/2017   HDL 41 06/18/2017   LDLCALC 100 06/18/2017   LDLCALC 100 06/18/2017   TRIG 193 06/18/2017   TRIG 193 (A) 06/18/2017    Significant Diagnostic Results in last 30 days:  No results found.  Assessment/Plan  1. Pulmonary emphysema, unspecified emphysema type ([place]) Old chest xray showing hyperinflation. Today wheezing has resolved. With have her on duoneb three times a day for now for maintenance bronchodilator dosing, I am scheduling her dosing as pt has dementia and does not remember to ask for her duoneb treatment. If symptoms recur or worsens, will consider adding a LAMA/ LABA. Continue prn robitussin. Defer chest xray for  now.   2. Allergic rhinitis, unspecified seasonality, unspecified trigger Denies nasal congestion and breathing is stable. Continue flonase.     Family/ staff Communication: reviewed care plan with patient and her daughter in law Gilmore Laroche over the phone and charge nurse.    Labs/tests ordered:  none  Blanchie Serve, MD Internal Medicine [facility] [address] [place], [place] [address] Cell Phone (Monday-Friday 8 am - 5 pm): [phone] On Call: [phone] and follow prompts after 5 pm and on weekends Office Phone: [phone] Office Fax: [phone]

## 2017-09-10 ENCOUNTER — Non-Acute Institutional Stay: Payer: Medicare Other

## 2017-09-10 DIAGNOSIS — Z0000 Encounter for general adult medical examination without abnormal findings: Secondary | ICD-10-CM | POA: Diagnosis not present

## 2017-09-10 NOTE — Patient Instructions (Addendum)
Kayla Weaver , Thank you for taking time to come for your Medicare Wellness Visit. I appreciate your ongoing commitment to your health goals. Please review the following plan we discussed and let me know if I can assist you in the future.   Screening recommendations/referrals: Colonoscopy excluded, over age 82 Mammogram excluded, over age 9 Bone Density up to date Recommended yearly ophthalmology/optometry visit for glaucoma screening and checkup Recommended yearly dental visit for hygiene and checkup  Vaccinations: Influenza vaccine due, will receive at [facility] Pneumococcal vaccine up to date, completed Tdap vaccine up to date, due 10/10/2020 Shingles vaccine not in past records    Advanced directives: in chart  Conditions/risks identified: none  Next appointment: Dr. Bubba Camp makes rounds   Preventive Care 33 Years and Older, Female Preventive care refers to lifestyle choices and visits with your health care provider that can promote health and wellness. What does preventive care include?  A yearly physical exam. This is also called an annual well check.  Dental exams once or twice a year.  Routine eye exams. Ask your health care provider how often you should have your eyes checked.  Personal lifestyle choices, including:  Daily care of your teeth and gums.  Regular physical activity.  Eating a healthy diet.  Avoiding tobacco and drug use.  Limiting alcohol use.  Practicing safe sex.  Taking low-dose aspirin every day.  Taking vitamin and mineral supplements as recommended by your health care provider. What happens during an annual well check? The services and screenings done by your health care provider during your annual well check will depend on your age, overall health, lifestyle risk factors, and family history of disease. Counseling  Your health care provider may ask you questions about your:  Alcohol use.  Tobacco use.  Drug use.  Emotional  well-being.  Home and relationship well-being.  Sexual activity.  Eating habits.  History of falls.  Memory and ability to understand (cognition).  Work and work Statistician.  Reproductive health. Screening  You may have the following tests or measurements:  Height, weight, and BMI.  Blood pressure.  Lipid and cholesterol levels. These may be checked every 5 years, or more frequently if you are over 5 years old.  Skin check.  Lung cancer screening. You may have this screening every year starting at age 55 if you have a 30-pack-year history of smoking and currently smoke or have quit within the past 15 years.  Fecal occult blood test (FOBT) of the stool. You may have this test every year starting at age 62.  Flexible sigmoidoscopy or colonoscopy. You may have a sigmoidoscopy every 5 years or a colonoscopy every 10 years starting at age 67.  Hepatitis C blood test.  Hepatitis B blood test.  Sexually transmitted disease (STD) testing.  Diabetes screening. This is done by checking your blood sugar (glucose) after you have not eaten for a while (fasting). You may have this done every 1-3 years.  Bone density scan. This is done to screen for osteoporosis. You may have this done starting at age 45.  Mammogram. This may be done every 1-2 years. Talk to your health care provider about how often you should have regular mammograms. Talk with your health care provider about your test results, treatment options, and if necessary, the need for more tests. Vaccines  Your health care provider may recommend certain vaccines, such as:  Influenza vaccine. This is recommended every year.  Tetanus, diphtheria, and acellular pertussis (Tdap, Td) vaccine.  You may need a Td booster every 10 years.  Zoster vaccine. You may need this after age 22.  Pneumococcal 13-valent conjugate (PCV13) vaccine. One dose is recommended after age 76.  Pneumococcal polysaccharide (PPSV23) vaccine. One  dose is recommended after age 69. Talk to your health care provider about which screenings and vaccines you need and how often you need them. This information is not intended to replace advice given to you by your health care provider. Make sure you discuss any questions you have with your health care provider. Document Released: 01/19/2015 Document Revised: 09/12/2015 Document Reviewed: 10/24/2014 Elsevier Interactive Patient Education  2017 [place] Prevention in the Home Falls can cause injuries. They can happen to people of all ages. There are many things you can do to make your home safe and to help prevent falls. What can I do on the outside of my home?  Regularly fix the edges of walkways and driveways and fix any cracks.  Remove anything that might make you trip as you walk through a door, such as a raised step or threshold.  Trim any bushes or trees on the path to your home.  Use bright outdoor lighting.  Clear any walking paths of anything that might make someone trip, such as rocks or tools.  Regularly check to see if handrails are loose or broken. Make sure that both sides of any steps have handrails.  Any raised decks and porches should have guardrails on the edges.  Have any leaves, snow, or ice cleared regularly.  Use sand or salt on walking paths during winter.  Clean up any spills in your garage right away. This includes oil or grease spills. What can I do in the bathroom?  Use night lights.  Install grab bars by the toilet and in the tub and shower. Do not use towel bars as grab bars.  Use non-skid mats or decals in the tub or shower.  If you need to sit down in the shower, use a plastic, non-slip stool.  Keep the floor dry. Clean up any water that spills on the floor as soon as it happens.  Remove soap buildup in the tub or shower regularly.  Attach bath mats securely with double-sided non-slip rug tape.  Do not have throw rugs and other  things on the floor that can make you trip. What can I do in the bedroom?  Use night lights.  Make sure that you have a light by your bed that is easy to reach.  Do not use any sheets or blankets that are too big for your bed. They should not hang down onto the floor.  Have a firm chair that has side arms. You can use this for support while you get dressed.  Do not have throw rugs and other things on the floor that can make you trip. What can I do in the kitchen?  Clean up any spills right away.  Avoid walking on wet floors.  Keep items that you use a lot in easy-to-reach places.  If you need to reach something above you, use a strong step stool that has a grab bar.  Keep electrical cords out of the way.  Do not use floor polish or wax that makes floors slippery. If you must use wax, use non-skid floor wax.  Do not have throw rugs and other things on the floor that can make you trip. What can I do with my stairs?  Do not leave any  items on the stairs.  Make sure that there are handrails on both sides of the stairs and use them. Fix handrails that are broken or loose. Make sure that handrails are as long as the stairways.  Check any carpeting to make sure that it is firmly attached to the stairs. Fix any carpet that is loose or worn.  Avoid having throw rugs at the top or bottom of the stairs. If you do have throw rugs, attach them to the floor with carpet tape.  Make sure that you have a light switch at the top of the stairs and the bottom of the stairs. If you do not have them, ask someone to add them for you. What else can I do to help prevent falls?  Wear shoes that:  Do not have high heels.  Have rubber bottoms.  Are comfortable and fit you well.  Are closed at the toe. Do not wear sandals.  If you use a stepladder:  Make sure that it is fully opened. Do not climb a closed stepladder.  Make sure that both sides of the stepladder are locked into place.  Ask  someone to hold it for you, if possible.  Clearly mark and make sure that you can see:  Any grab bars or handrails.  First and last steps.  Where the edge of each step is.  Use tools that help you move around (mobility aids) if they are needed. These include:  Canes.  Walkers.  Scooters.  Crutches.  Turn on the lights when you go into a dark area. Replace any light bulbs as soon as they burn out.  Set up your furniture so you have a clear path. Avoid moving your furniture around.  If any of your floors are uneven, fix them.  If there are any pets around you, be aware of where they are.  Review your medicines with your doctor. Some medicines can make you feel dizzy. This can increase your chance of falling. Ask your doctor what other things that you can do to help prevent falls. This information is not intended to replace advice given to you by your health care provider. Make sure you discuss any questions you have with your health care provider. Document Released: 10/19/2008 Document Revised: 05/31/2015 Document Reviewed: 01/27/2014 Elsevier Interactive Patient Education  2017 [employer].

## 2017-09-10 NOTE — Progress Notes (Signed)
Subjective:   Kayla Weaver is a 84 y.o. female who presents for Medicare Annual (Subsequent) preventive examination at [place] AWV-08/27/2016    Objective:     Vitals: BP 136/73 (BP Location: Left Arm, Patient Position: Sitting)   Pulse 76   Temp 97.9 F (36.6 C) (Oral)   Ht 5\' 4"  (1.626 m)   Wt 160 lb (72.6 kg)   BMI 27.46 kg/m   Body mass index is 27.46 kg/m.  Advanced Directives 09/10/2017 08/31/2017 08/14/2017 08/05/2017 07/20/2017 06/19/2017 06/15/2017  Does Patient Have a Medical Advance Directive? Yes Yes Yes Yes Yes Yes Yes  Type of Paramedic of [place];Out of facility DNR (pink MOST or yellow form);Living will [place];Out of facility DNR (pink MOST or yellow form);Living will [place];Out of facility DNR (pink MOST or yellow form);Living will [place];Living will;Out of facility DNR (pink MOST or yellow form) [place];Living will;Out of facility DNR (pink MOST or yellow form) [place];Out of facility DNR (pink MOST or yellow form);Living will [place];Out of facility DNR (pink MOST or yellow form);Living will  Does patient want to make changes to medical advance directive? No - Patient declined - - No - Patient declined No - Patient declined - -  Copy of [place] in Chart? Yes Yes Yes Yes Yes Yes Yes  Would patient like information on creating a medical advance directive? - - - - - - -  Pre-existing out of facility DNR order (yellow form or pink MOST form) Yellow form placed in chart (order not valid for inpatient use) Yellow form placed in chart (order not valid for inpatient use) Yellow form placed in chart (order not valid for inpatient use) Yellow form placed in chart (order not valid for inpatient use) Yellow form placed in chart (order not valid for inpatient use) Yellow form  placed in chart (order not valid for inpatient use) Yellow form placed in chart (order not valid for inpatient use)    Tobacco Social History   Tobacco Use  Smoking Status Never Smoker  Smokeless Tobacco Never Used     Counseling given: Not Answered   Clinical Intake:  Pre-visit preparation completed: No  Pain : No/denies pain     Nutritional Risks: None Diabetes: Yes CBG done?: No Did pt. bring in CBG monitor from home?: No  How often do you need to have someone help you when you read instructions, pamphlets, or other written materials from your doctor or pharmacy?: 2 - Rarely What is the last grade level you completed in school?: college  Interpreter Needed?: No  Information entered by :: Tyson Dense, RN  Past Medical History:  Diagnosis Date  . Allergic rhinitis   . Asthma   . Carpal tunnel syndrome 1980  . Chronic low back pain   . Constipation 06/27/2014  . GERD (gastroesophageal reflux disease)   . Hearing loss    Hearing aids  . Hyperglycemia 06/27/2014  . Hyperlipidemia 06/27/2014  . Hypertension   . Insomnia 06/27/2014  . Left leg DVT ([place])    chronic since 2008  . Memory loss 06/27/2014  . Meningioma ([place]) 2000   Dr. Ellene Route  . Osteopenia    Past Surgical History:  Procedure Laterality Date  . BACK SURGERY  1999   ruptured disk  . BRAIN TUMOR EXCISION  2000   Dr. Ellene Route  .  CARPAL TUNNEL RELEASE  1980  . TEAR DUCT PROBING Bilateral 2014   [place]   Family History  Problem Relation Age of Onset  . CVA Mother   . Cancer Father        prostate    Social History   Socioeconomic History  . Marital status: Widowed    Spouse name: Not on file  . Number of children: Not on file  . Years of education: Not on file  . Highest education level: Not on file  Occupational History  . Occupation: housewife  Social Needs  . Financial resource strain: Not hard at all  . Food insecurity:    Worry: Never true    Inability: Never true  .  Transportation needs:    Medical: No    Non-medical: No  Tobacco Use  . Smoking status: Never Smoker  . Smokeless tobacco: Never Used  Substance and Sexual Activity  . Alcohol use: No  . Drug use: No  . Sexual activity: Never  Lifestyle  . Physical activity:    Days per week: 7 days    Minutes per session: 30 min  . Stress: Not at all  Relationships  . Social connections:    Talks on phone: More than three times a week    Gets together: Never    Attends religious service: Never    Active member of club or organization: No    Attends meetings of clubs or organizations: Never    Relationship status: Widowed  Other Topics Concern  . Not on file  Social History Narrative   Lives at [facility] since 12/05/2010, moved to [place] 03/23/15   Widowed   Never smoked   Alcohol none   Exercise -walking daily, walks with walker    Living Will, POA    Outpatient Encounter Medications as of 09/10/2017  Medication Sig  . amLODipine-atorvastatin (CADUET) 5-10 MG tablet TAKE ONE TABLET DAILY FOR BLOOD PRESSURE  . docusate sodium (COLACE) 100 MG capsule Take 100 mg by mouth daily.   Marland Kitchen donepezil (ARICEPT) 10 MG tablet Take 5 mg by mouth at bedtime.   . fluticasone (FLONASE) 50 MCG/ACT nasal spray Place 2 sprays into both nostrils daily.  Marland Kitchen ipratropium-albuterol (DUONEB) 0.5-2.5 (3) MG/3ML SOLN Take 3 mLs by nebulization 3 (three) times daily. Three times a day    . levothyroxine (SYNTHROID, LEVOTHROID) 75 MCG tablet Take 75 mcg by mouth daily before breakfast.  . linagliptin (TRADJENTA) 5 MG TABS tablet Take 5 mg by mouth daily.  Marland Kitchen lisinopril (PRINIVIL,ZESTRIL) 10 MG tablet Take 1 tablet (10 mg total) by mouth daily.  . memantine (NAMENDA) 10 MG tablet Take 5 mg by mouth 2 (two) times daily.   . Multiple Vitamins-Minerals (ICAPS) CAPS Take 1 capsule by mouth 2 (two) times daily. For eyes  . omeprazole (PRILOSEC) 20 MG capsule Take 1 capsule (20 mg total) by mouth daily.   No  facility-administered encounter medications on file as of 09/10/2017.     Activities of Daily Living In your present state of health, do you have any difficulty performing the following activities: 09/10/2017  Hearing? Y  Vision? N  Difficulty concentrating or making decisions? Y  Walking or climbing stairs? Y  Dressing or bathing? Y  Doing errands, shopping? Y  Preparing Food and eating ? Y  Using the Toilet? N  In the past six months, have you accidently leaked urine? N  Do you have problems with loss of bowel control? N  Managing your Medications? Y  Managing your Finances? Y  Housekeeping or managing your Housekeeping? Y  Some recent data might be hidden    Patient Care Team: Blanchie Serve, MD as PCP - General (Internal Medicine) McKenzie, Lilia Argue, MD (Inactive) (Anesthesiology) Estill Dooms, MD as Consulting Physician (Internal Medicine) Kristeen Miss, MD as Consulting Physician (Neurosurgery) Shon Hough, MD as Consulting Physician (Ophthalmology) [name], [facility] ([place] and [place]) [name], MD as Consulting Physician (Dermatology) Ngetich, Nelda Bucks, NP as Nurse Practitioner (Family Medicine)    Assessment:   This is a routine wellness examination for [place].  Exercise Activities and Dietary recommendations Current Exercise Habits: Home exercise routine, Type of exercise: walking, Time (Minutes): 30, Frequency (Times/Week): 7, Weekly Exercise (Minutes/Week): 210, Intensity: Mild, Exercise limited by: orthopedic condition(s)  Goals   None     Fall Risk Fall Risk  09/10/2017 08/27/2016 06/12/2015 06/12/2015 03/13/2015  Falls in the past year? No No No No Yes  Number falls in past yr: - - - - 1  Injury with Fall? - - - - No   Is the patient's home free of loose throw rugs in walkways, pet beds, electrical cords, etc?   yes      Grab bars in the bathroom? yes      Handrails on the stairs?   yes      Adequate lighting?    yes  Depression Screen PHQ 2/9 Scores 09/10/2017 08/27/2016 06/27/2014  PHQ - 2 Score 0 0 0     Cognitive Function MMSE - Mini Mental State Exam 09/10/2017 08/27/2016 10/03/2014  Orientation to time 0 2 1  Orientation to Place 0 4 5  Registration 3 3 3   Attention/ Calculation 4 4 5   Recall 0 0 2  Language- name 2 objects 2 2 2   Language- repeat 1 1 1   Language- follow 3 step command 3 3 3   Language- read & follow direction 1 1 1   Write a sentence 1 1 1   Copy design 0 0 1  Total score 15 21 25         Immunization History  Administered Date(s) Administered  . Influenza-Unspecified 10/20/2013, 10/05/2014, 10/18/2015, 10/15/2016  . PPD Test 10/10/2013, 03/23/2015, 04/10/2015  . Pneumococcal Polysaccharide-23 02/06/2009  . Td 10/11/2010  . Zoster 01/08/2012    Qualifies for Shingles Vaccine? Not in past records  Screening Tests Health Maintenance  Topic Date Due  . FOOT EXAM  10/07/1932  . INFLUENZA VACCINE  08/06/2017  . HEMOGLOBIN A1C  12/18/2017  . OPHTHALMOLOGY EXAM  02/27/2018  . TETANUS/TDAP  10/10/2020  . DEXA SCAN  Completed  . PNA vac Low Risk Adult  Completed    Cancer Screenings: Lung: Low Dose CT Chest recommended if Age 70-80 years, 30 pack-year currently smoking OR have quit w/in 15years. Patient does not qualify. Breast:  Up to date on Mammogram? Yes   Up to date of Bone Density/Dexa? Yes Colorectal: up to date  Additional Screenings:  Hepatitis C Screening: declined Flu vaccine due: will receive at [facility]:    I have personally reviewed and addressed the Medicare Annual Wellness questionnaire and have noted the following in the patient's chart:  A. Medical and social history B. Use of alcohol, tobacco or illicit drugs  C. Current medications and supplements D. Functional ability and status E.  Nutritional status F.  Physical activity G. Advance directives H. List of other physicians I.  Hospitalizations, surgeries, and ER  visits in previous  12 months J.  [place] to include hearing, vision, cognitive, depression L. Referrals and appointments - none  In addition, I have reviewed and discussed with patient certain preventive protocols, quality metrics, and best practice recommendations. A written personalized care plan for preventive services as well as general preventive health recommendations were provided to patient.  See attached scanned questionnaire for additional information.   Signed,   Tyson Dense, RN Nurse Health Advisor  Patient Concerns: None

## 2017-09-14 ENCOUNTER — Non-Acute Institutional Stay: Payer: Medicare Other | Admitting: Internal Medicine

## 2017-09-14 ENCOUNTER — Encounter: Payer: Self-pay | Admitting: Internal Medicine

## 2017-09-14 DIAGNOSIS — J309 Allergic rhinitis, unspecified: Secondary | ICD-10-CM | POA: Diagnosis not present

## 2017-09-14 DIAGNOSIS — N183 Chronic kidney disease, stage 3 (moderate): Secondary | ICD-10-CM | POA: Diagnosis not present

## 2017-09-14 DIAGNOSIS — K5901 Slow transit constipation: Secondary | ICD-10-CM | POA: Diagnosis not present

## 2017-09-14 DIAGNOSIS — G309 Alzheimer's disease, unspecified: Secondary | ICD-10-CM | POA: Diagnosis not present

## 2017-09-14 DIAGNOSIS — I10 Essential (primary) hypertension: Secondary | ICD-10-CM

## 2017-09-14 DIAGNOSIS — M19012 Primary osteoarthritis, left shoulder: Secondary | ICD-10-CM | POA: Diagnosis not present

## 2017-09-14 DIAGNOSIS — E039 Hypothyroidism, unspecified: Secondary | ICD-10-CM

## 2017-09-14 DIAGNOSIS — K219 Gastro-esophageal reflux disease without esophagitis: Secondary | ICD-10-CM

## 2017-09-14 DIAGNOSIS — J439 Emphysema, unspecified: Secondary | ICD-10-CM | POA: Diagnosis not present

## 2017-09-14 DIAGNOSIS — F0280 Dementia in other diseases classified elsewhere without behavioral disturbance: Secondary | ICD-10-CM | POA: Diagnosis not present

## 2017-09-14 DIAGNOSIS — E1122 Type 2 diabetes mellitus with diabetic chronic kidney disease: Secondary | ICD-10-CM | POA: Diagnosis not present

## 2017-09-14 NOTE — Progress Notes (Signed)
Location:  [facility] Room Number: 17 Place of Service:  ALF [phone]) Provider:  Blanchie Serve MD  Blanchie Serve, MD  Patient Care Team: Blanchie Serve, MD as PCP - General (Internal Medicine) McKenzie, Lilia Argue, MD (Inactive) (Anesthesiology) Estill Dooms, MD as Consulting Physician (Internal Medicine) Kristeen Miss, MD as Consulting Physician (Neurosurgery) Shon Hough, MD as Consulting Physician (Ophthalmology) [name], [facility] ([place] and [place]) [name], MD as Consulting Physician (Dermatology) Ngetich, Nelda Bucks, NP as Nurse Practitioner (Family Medicine)  Extended Emergency Contact Information Primary Emergency Contact: [name] Address: [address]          [name] Phone: [phone] Mobile Phone: [phone] Relation: Son Secondary Emergency Contact: [place], [place] Mobile Phone: [phone] Relation: Daughter Preferred language: [place] Interpreter needed? No  Code Status:  DNR  Goals of care: Advanced Directive information Advanced Directives 09/14/2017  Does Patient Have a Medical Advance Directive? Yes  Type of Paramedic of [place];Out of facility DNR (pink MOST or yellow form);Living will  Does patient want to make changes to medical advance directive? -  Copy of [place] in Chart? Yes  Would patient like information on creating a medical advance directive? -  Pre-existing out of facility DNR order (yellow form or pink MOST form) Yellow form placed in chart (order not valid for inpatient use)     Chief Complaint  Patient presents with  . Medical Management of Chronic Issues    quarterly routine visit    HPI:  Pt is a 84 y.o. female seen today for medical management of chronic diseases. She is seen in her room with charge nurse at bedside. She is pleasantly confused but interactive. She denies any  acute concern this visit. No acute concern from nursing. She is taking amlodipine and lisinopril for her blood pressure and tolerating it fine. Has some swelling to her legs and wears ted hose. She takes levothyroxine for her hypothyroidism. Tolerating this well. Currently on atorvastatin for her hyperlipidemia. Denies muscle aches or pain. Tolerating her donepezil and memantine well. Pleasantly confused but staff denies any acute behavioral changes. Her breathing has improved with scheduled duoneb and staff and pt denies cough or wheezing. She is on tradjenta for her diabetes. No hypoglycemia reported by nursing. Denies any reflux symptom or trouble swallowing and is taking prilosec.    Past Medical History:  Diagnosis Date  . Allergic rhinitis   . Asthma   . Carpal tunnel syndrome 1980  . Chronic low back pain   . Constipation 06/27/2014  . GERD (gastroesophageal reflux disease)   . Hearing loss    Hearing aids  . Hyperglycemia 06/27/2014  . Hyperlipidemia 06/27/2014  . Hypertension   . Insomnia 06/27/2014  . Left leg DVT ([place])    chronic since 2008  . Memory loss 06/27/2014  . Meningioma ([place]) 2000   Dr. Ellene Route  . Osteopenia    Past Surgical History:  Procedure Laterality Date  . BACK SURGERY  1999   ruptured disk  . BRAIN TUMOR EXCISION  2000   Dr. Ellene Route  . CARPAL TUNNEL RELEASE  1980  . TEAR DUCT PROBING Bilateral 2014   [place]    No Known Allergies  Outpatient Encounter Medications as of 09/14/2017  Medication Sig  . amLODipine-atorvastatin (CADUET) 5-10 MG tablet TAKE ONE TABLET DAILY FOR BLOOD PRESSURE  . docusate sodium (COLACE) 100 MG capsule Take 100 mg by  mouth daily.   Marland Kitchen donepezil (ARICEPT) 10 MG tablet Take 5 mg by mouth at bedtime.   . fluticasone (FLONASE) 50 MCG/ACT nasal spray Place 2 sprays into both nostrils daily.  Marland Kitchen ipratropium-albuterol (DUONEB) 0.5-2.5 (3) MG/3ML SOLN Take 3 mLs by nebulization 3 (three) times daily. Three times a day    . levothyroxine  (SYNTHROID, LEVOTHROID) 75 MCG tablet Take 75 mcg by mouth daily before breakfast.  . linagliptin (TRADJENTA) 5 MG TABS tablet Take 5 mg by mouth daily.  Marland Kitchen lisinopril (PRINIVIL,ZESTRIL) 10 MG tablet Take 1 tablet (10 mg total) by mouth daily.  . memantine (NAMENDA) 10 MG tablet Take 5 mg by mouth 2 (two) times daily.   . Multiple Vitamins-Minerals (ICAPS) CAPS Take 1 capsule by mouth 2 (two) times daily. For eyes  . omeprazole (PRILOSEC) 20 MG capsule Take 1 capsule (20 mg total) by mouth daily.   No facility-administered encounter medications on file as of 09/14/2017.     Review of Systems  Constitutional: Negative for appetite change, chills, diaphoresis, fatigue and fever.  HENT: Positive for hearing loss. Negative for congestion, mouth sores, postnasal drip, rhinorrhea, sinus pressure, sinus pain, sore throat and trouble swallowing.        Takes flonase for nasal congestion and denies any congestion this visit  Respiratory: Negative for cough, shortness of breath and wheezing.   Cardiovascular: Positive for leg swelling. Negative for chest pain and palpitations.  Gastrointestinal: Negative for abdominal pain, blood in stool, constipation, diarrhea, nausea and vomiting.  Genitourinary: Negative for dysuria and flank pain.  Musculoskeletal: Positive for arthralgias and gait problem. Negative for back pain, myalgias and neck pain.       Complaints of pain to left shoulder  Neurological: Negative for dizziness, weakness, numbness and headaches.  Hematological: Bruises/bleeds easily.  Psychiatric/Behavioral: Positive for confusion and decreased concentration. Negative for behavioral problems and hallucinations. The patient is not nervous/anxious.     Immunization History  Administered Date(s) Administered  . Influenza-Unspecified 10/20/2013, 10/05/2014, 10/18/2015, 10/15/2016  . PPD Test 10/10/2013, 03/23/2015, 04/10/2015  . Pneumococcal Polysaccharide-23 02/06/2009  . Td 10/11/2010  .  Zoster 01/08/2012   Pertinent  Health Maintenance Due  Topic Date Due  . FOOT EXAM  10/07/1932  . INFLUENZA VACCINE  08/06/2017  . HEMOGLOBIN A1C  12/18/2017  . OPHTHALMOLOGY EXAM  02/27/2018  . DEXA SCAN  Completed  . PNA vac Low Risk Adult  Completed   Fall Risk  09/10/2017 08/27/2016 06/12/2015 06/12/2015 03/13/2015  Falls in the past year? No No No No Yes  Number falls in past yr: - - - - 1  Injury with Fall? - - - - No   Functional Status Survey:    Vitals:   09/14/17 1037  BP: (!) 149/66  Pulse: 77  Resp: 16  Temp: 98.9 F (37.2 C)  SpO2: 94%  Weight: 160 lb 3.2 oz (72.7 kg)  Height: 5\' 4"  (1.626 m)   Body mass index is 27.5 kg/m.   Wt Readings from Last 3 Encounters:  09/14/17 160 lb 3.2 oz (72.7 kg)  09/10/17 160 lb (72.6 kg)  09/07/17 160 lb 3.2 oz (72.7 kg)   Physical Exam  Constitutional: No distress.  Overweight elderly female  HENT:  Head: Normocephalic and atraumatic.  Nose: Nose normal.  Mouth/Throat: Oropharynx is clear and moist. No oropharyngeal exudate.  Eyes: Pupils are equal, round, and reactive to light. Conjunctivae and EOM are normal. Right eye exhibits no discharge. Left eye exhibits no discharge.  Neck: Normal range of motion. Neck supple.  Cardiovascular: Normal rate and regular rhythm.  Pulmonary/Chest: Effort normal. No respiratory distress. She has no rales.  Diminished breath sounds, occasional expiratory wheezing  Abdominal: Soft. Bowel sounds are normal. There is no tenderness. There is no rebound and no guarding.  Musculoskeletal: She exhibits edema.  Trace leg edema, unsteady gait and uses walker, limited overhead abduction of left shoulder with pain to posterior upper back area.  Lymphadenopathy:    She has no cervical adenopathy.  Neurological: She is alert. She exhibits normal muscle tone.  Oriented to self and place  Skin: Skin is warm and dry. She is not diaphoretic.  Psychiatric: She has a normal mood and affect.    Labs  reviewed: Recent Labs    03/05/17 03/30/17 06/18/17  NA 139  139 138 141  141  K 4.2  4.2 4.3 4.5  4.5  CL 103 103 102  CO2 27 29 29   BUN 29* 27* 27*  CREATININE 1.3*  1.32 1.2* 1.3*  1.26  CALCIUM 9.4  9.4 9.4 9.2  9.2   Recent Labs    03/05/17 06/18/17  AST 13  13 13   ALT 12  12 16   ALKPHOS 60  60 64  BILITOT 0.4 0.4  PROT 6.1  6.1 6.4  6.4  ALBUMIN 3.8  3.8 3.9  3.9   Recent Labs    11/06/16 03/05/17 06/18/17  WBC 9.8  --  8.0  8  NEUTROABS 6,145  --  4,016  HGB 11.6* 11.0 11.8*  11.8  HCT 34*  --  35*  34.7  MCV  --   --  92.3  PLT 241  --  261   Lab Results  Component Value Date   TSH 5.10 06/18/2017   TSH 5.10 06/18/2017   Lab Results  Component Value Date   HGBA1C 7.7 06/18/2017   Lab Results  Component Value Date   CHOL 172 06/18/2017   CHOL 172 06/18/2017   HDL 41 06/18/2017   LDLCALC 100 06/18/2017   LDLCALC 100 06/18/2017   TRIG 193 06/18/2017   TRIG 193 (A) 06/18/2017    Significant Diagnostic Results in last 30 days:  No results found.  Assessment/Plan  1. Type 2 diabetes mellitus with stage 3 chronic kidney disease, without long-term current use of insulin ([place]) Reviewed blood sugar readings between 110-150. Continue tradjenta for now. Check a1c. Continue lisinopril for renal protection. Continue statin  2. Essential hypertension Reviewed BP reading and most SBP are below 140. Continue current regimen of lisinopril and amlodipine. Check renal function  3. Allergic rhinitis, unspecified seasonality, unspecified trigger Improved with flonase, continue this  4. Pulmonary emphysema, unspecified emphysema type ([place]) Add tiotropium inhaler once a day for maintenance regimen to help subside inflammation and help with breathing. Change duoneb to bid for now.   5. Gastroesophageal reflux disease without esophagitis Continue PPI and monitor  6. Acquired hypothyroidism Check TSH. Continue levothyroxine  7. Alzheimer's  dementia without behavioral disturbance, unspecified timing of dementia onset Continue donepezil and memantine. Supportive care. Fall precautions  8. Slow transit constipation Continue colace, maintain hydration  9. Osteoarthritis of left shoulder, unspecified osteoarthritis type Add biofreeze bid to left shoulder and monitor    Family/ staff Communication: reviewed care plan with patient, her daughter in law over the phone and charge nurse.    Labs/tests ordered:  Cbc, cmp, TSH, A1c 09/17/17   Blanchie Serve, MD Internal Medicine [facility] [place], [place] [address] Cell Phone (Monday-Friday 8 am - 5 pm): [phone] On Call: [phone] and follow prompts after 5 pm and on weekends Office Phone: [phone] Office Fax: [phone]

## 2017-09-17 DIAGNOSIS — E039 Hypothyroidism, unspecified: Secondary | ICD-10-CM | POA: Diagnosis not present

## 2017-09-17 DIAGNOSIS — E1122 Type 2 diabetes mellitus with diabetic chronic kidney disease: Secondary | ICD-10-CM | POA: Diagnosis not present

## 2017-09-17 DIAGNOSIS — N183 Chronic kidney disease, stage 3 (moderate): Secondary | ICD-10-CM | POA: Diagnosis not present

## 2017-09-17 LAB — BASIC METABOLIC PANEL
BUN: 22 — AB (ref 4–21)
CREATININE: 1.34
Calcium: 9
EGFR (Non-African Amer.): 34
GLUCOSE: 133
Potassium: 4.4
Sodium: 138

## 2017-09-17 LAB — TSH: TSH: 3.41

## 2017-09-17 LAB — HEMOGLOBIN A1C: A1c: 7.4

## 2017-09-18 ENCOUNTER — Encounter: Payer: Self-pay | Admitting: *Deleted

## 2017-09-24 DIAGNOSIS — E1122 Type 2 diabetes mellitus with diabetic chronic kidney disease: Secondary | ICD-10-CM | POA: Diagnosis not present

## 2017-09-24 DIAGNOSIS — E039 Hypothyroidism, unspecified: Secondary | ICD-10-CM | POA: Diagnosis not present

## 2017-09-24 DIAGNOSIS — F0390 Unspecified dementia without behavioral disturbance: Secondary | ICD-10-CM | POA: Diagnosis not present

## 2017-09-24 DIAGNOSIS — N183 Chronic kidney disease, stage 3 (moderate): Secondary | ICD-10-CM | POA: Diagnosis not present

## 2017-09-24 LAB — BASIC METABOLIC PANEL
BUN: 19 (ref 4–21)
Creatinine: 1.2 — AB (ref 0.5–1.1)
Glucose: 149
SODIUM: 139 (ref 137–147)

## 2017-09-24 LAB — TSH: TSH: 4.31 (ref 0.41–5.90)

## 2017-09-24 LAB — HEPATIC FUNCTION PANEL
ALK PHOS: 66 (ref 25–125)
ALT: 15 (ref 7–35)
AST: 14 (ref 13–35)
BILIRUBIN, TOTAL: 0.3

## 2017-09-24 LAB — CBC AND DIFFERENTIAL
HEMATOCRIT: 37 (ref 36–46)
HEMOGLOBIN: 12.3 (ref 12.0–16.0)
NEUTROS ABS: 4618
Platelets: 273 (ref 150–399)
WBC: 9.2

## 2017-09-24 LAB — HEMOGLOBIN A1C: HEMOGLOBIN A1C: 7.4

## 2017-09-25 ENCOUNTER — Other Ambulatory Visit: Payer: Self-pay | Admitting: Family

## 2017-09-25 NOTE — Progress Notes (Signed)
Labs: CMP,CBC/diff,TSH  And Hgb A1C abstracted.

## 2017-09-28 ENCOUNTER — Encounter: Payer: Medicare Other | Admitting: Internal Medicine

## 2017-10-08 ENCOUNTER — Encounter: Payer: Self-pay | Admitting: Family

## 2017-10-08 ENCOUNTER — Non-Acute Institutional Stay: Payer: Medicare Other | Admitting: Family

## 2017-10-08 DIAGNOSIS — R05 Cough: Secondary | ICD-10-CM

## 2017-10-08 DIAGNOSIS — R1312 Dysphagia, oropharyngeal phase: Secondary | ICD-10-CM

## 2017-10-08 DIAGNOSIS — J189 Pneumonia, unspecified organism: Secondary | ICD-10-CM | POA: Diagnosis not present

## 2017-10-08 DIAGNOSIS — R058 Other specified cough: Secondary | ICD-10-CM

## 2017-10-08 NOTE — Progress Notes (Signed)
Location:  [place] Room Number: [place] of Service:  ALF [phone]) Provider: Dinah Ngetich FNP-C  Wardell Honour, MD  Patient Care Team: Wardell Honour, MD as PCP - General (Family Medicine) McKenzie, Lilia Argue, MD (Inactive) (Anesthesiology) Estill Dooms, MD as Consulting Physician (Internal Medicine) Kristeen Miss, MD as Consulting Physician (Neurosurgery) Shon Hough, MD as Consulting Physician (Ophthalmology) [name], [facility] ([place]) [name], MD as Consulting Physician (Dermatology) Ngetich, Nelda Bucks, NP as Nurse Practitioner (Family Medicine)  Extended Emergency Contact Information Primary Emergency Contact: [name] Address: [address]          [name] Phone: [phone] Mobile Phone: [phone] Relation: Son Secondary Emergency Contact: Tae, Robak Mobile Phone: [phone] Relation: Daughter Preferred language: English Interpreter needed? No  Code Status:  DNR Goals of care: Advanced Directive information Advanced Directives 10/08/2017  Does Patient Have a Medical Advance Directive? Yes  Type of Paramedic of [place];Living will;Out of facility DNR (pink MOST or yellow form)  Does patient want to make changes to medical advance directive? No - Patient declined  Copy of [place] in Chart? Yes  Would patient like information on creating a medical advance directive? -  Pre-existing out of facility DNR order (yellow form or pink MOST form) Yellow form placed in chart (order not valid for inpatient use)     Chief Complaint  Patient presents with  . Acute Visit    Choking on food    HPI:  Pt is a 84 y.o. female seen today at [facility] for an acute visit for evaluation of choking during mealtime.she is seen in her room today per facility Nurse request.Nurse reports patient had  a severe episode of choking on food during her lunch meal 10/07/2017.she states patient was able to clear her throat.No choking episodes on her meals today.Patient unable to provide HPI and ROS due to her cognitive impairment.she continues to have episodes of cough though has a significant medical history of pulmonary emphysema.    Past Medical History:  Diagnosis Date  . Allergic rhinitis   . Asthma   . Carpal tunnel syndrome 1980  . Chronic low back pain   . Constipation 06/27/2014  . GERD (gastroesophageal reflux disease)   . Hearing loss    Hearing aids  . Hyperglycemia 06/27/2014  . Hyperlipidemia 06/27/2014  . Hypertension   . Insomnia 06/27/2014  . Left leg DVT ([place])    chronic since 2008  . Memory loss 06/27/2014  . Meningioma ([place]) 2000   Dr. Ellene Route  . Osteopenia    Past Surgical History:  Procedure Laterality Date  . BACK SURGERY  1999   ruptured disk  . BRAIN TUMOR EXCISION  2000   Dr. Ellene Route  . CARPAL TUNNEL RELEASE  1980  . TEAR DUCT PROBING Bilateral 2014   Saluidon    No Known Allergies  Outpatient Encounter Medications as of 10/08/2017  Medication Sig  . amLODipine-atorvastatin (CADUET) 5-10 MG tablet TAKE ONE TABLET DAILY FOR BLOOD PRESSURE  . Dextromethorphan-guaiFENesin 10-200 MG/5ML LIQD Take 10 mLs by mouth every 4 (four) hours as needed.  . docusate sodium (COLACE) 100 MG capsule Take 100 mg by mouth daily.   Marland Kitchen donepezil (ARICEPT) 10 MG tablet Take 5 mg by mouth at bedtime.   . fluticasone (FLONASE) 50 MCG/ACT nasal spray Place 2 sprays into both nostrils daily.   Marland Kitchen  ipratropium-albuterol (DUONEB) 0.5-2.5 (3) MG/3ML SOLN Take 3 mLs by nebulization 2 (two) times daily. while awake for wheezing  . levothyroxine (SYNTHROID, LEVOTHROID) 75 MCG tablet Take 75 mcg by mouth daily before breakfast.  . linagliptin (TRADJENTA) 5 MG TABS tablet Take 5 mg by mouth every morning.   Marland Kitchen lisinopril (PRINIVIL,ZESTRIL) 10 MG tablet Take 1 tablet (10 mg total) by mouth daily.   . memantine (NAMENDA) 10 MG tablet Take 5 mg by mouth 2 (two) times daily.   . Menthol, Topical Analgesic, (BIOFREEZE) 4 % GEL Apply to left shoulder topically 2 times daily  . Multiple Vitamins-Minerals (ICAPS AREDS 2) CAPS Take 1 tablet by mouth 2 (two) times daily.  Marland Kitchen omeprazole (PRILOSEC) 20 MG capsule Take 1 capsule (20 mg total) by mouth daily.  Marland Kitchen umeclidinium-vilanterol (ANORO ELLIPTA) 62.5-25 MCG/INH AEPB Inhale 1 puff into the lungs daily.  . [DISCONTINUED] Multiple Vitamins-Minerals (ICAPS) CAPS Take 1 capsule by mouth 2 (two) times daily. For eyes   No facility-administered encounter medications on file as of 10/08/2017.     Review of Systems  Unable to perform ROS: Dementia (additional information provided by facility Nurse)    Immunization History  Administered Date(s) Administered  . Influenza-Unspecified 10/20/2013, 10/05/2014, 10/18/2015, 10/15/2016  . PPD Test 10/10/2013, 03/23/2015, 04/10/2015  . Pneumococcal Polysaccharide-23 02/06/2009  . Td 10/11/2010  . Zoster 01/08/2012   Pertinent  Health Maintenance Due  Topic Date Due  . FOOT EXAM  10/07/1932  . INFLUENZA VACCINE  08/06/2017  . OPHTHALMOLOGY EXAM  02/27/2018  . HEMOGLOBIN A1C  03/25/2018  . DEXA SCAN  Completed  . PNA vac Low Risk Adult  Completed   Fall Risk  09/10/2017 08/27/2016 06/12/2015 06/12/2015 03/13/2015  Falls in the past year? No No No No Yes  Number falls in past yr: - - - - 1  Injury with Fall? - - - - No    Vitals:   10/08/17 1200  BP: 116/80  Pulse: 78  Resp: 20  Temp: 98.2 F (36.8 C)  TempSrc: Oral  SpO2: 92%  Weight: 161 lb (73 kg)  Height: 5\' 5"  (1.651 m)   Body mass index is 26.79 kg/m. Physical Exam  Constitutional:  Pleasantly confused elderly in no acute distress   HENT:  Head: Normocephalic.  Mouth/Throat: Oropharynx is clear and moist. No oropharyngeal exudate.  Eyes: Pupils are equal, round, and reactive to light. Conjunctivae are normal. Right eye exhibits no  discharge. Left eye exhibits no discharge. No scleral icterus.  Neck: Normal range of motion. No JVD present. No thyromegaly present.  Cardiovascular: Normal rate, regular rhythm, normal heart sounds and intact distal pulses. Exam reveals no gallop and no friction rub.  No murmur heard. Pulmonary/Chest: Effort normal. No respiratory distress. She has no wheezes. She has no rales.  Right upper lobe rhonchi and poor air entry bilaterally.   Abdominal: Soft. Bowel sounds are normal. She exhibits no distension and no mass. There is no tenderness. There is no rebound and no guarding.  Musculoskeletal: Normal range of motion. She exhibits no tenderness.  Unsteady gait ambulates with walker.Bilaetral knee high ted hose in place trace edema noted.   Lymphadenopathy:    She has no cervical adenopathy.  Neurological: She is alert. Gait abnormal.  Pleasantly confused at her baseline   Skin: Skin is warm and dry. No rash noted. No erythema. No pallor.  Psychiatric: She has a normal mood and affect. Her speech is normal and behavior is normal. Judgment and thought  content normal. She exhibits abnormal recent memory.  Nursing note and vitals reviewed.   Labs reviewed: Recent Labs    03/05/17 03/30/17 06/18/17 09/17/17 09/24/17  NA 139  139 138 141  141 138 139  K 4.2  4.2 4.3 4.5  4.5 4.4  --   CL 103 103 102  --   --   CO2 27 29 29   --   --   BUN 29* 27* 27* 22* 19  CREATININE 1.3*  1.32 1.2* 1.3*  1.26 1.34 1.2*  CALCIUM 9.4  9.4 9.4 9.2  9.2 9.0  --    Recent Labs    03/05/17 06/18/17 09/24/17  AST 13  13 13 14   ALT 12  12 16 15   ALKPHOS 60  60 64 66  BILITOT 0.4 0.4  --   PROT 6.1  6.1 6.4  6.4  --   ALBUMIN 3.8  3.8 3.9  3.9  --    Recent Labs    11/06/16  06/18/17 09/24/17  WBC 9.8  --  8.0  8 9.2  NEUTROABS 6,145  --  4,016 4,618  HGB 11.6*   < > 11.8*  11.8 12.3  HCT 34*  --  35*  34.7 37  MCV  --   --  92.3  --   PLT 241  --  261 273   < > = values in this  interval not displayed.   Lab Results  Component Value Date   TSH 4.31 09/24/2017   Lab Results  Component Value Date   HGBA1C 7.4 09/24/2017   Lab Results  Component Value Date   CHOL 172 06/18/2017   CHOL 172 06/18/2017   HDL 41 06/18/2017   LDLCALC 100 06/18/2017   LDLCALC 100 06/18/2017   TRIG 193 06/18/2017   TRIG 193 (A) 06/18/2017    Significant Diagnostic Results in last 30 days:  No results found.  Assessment/Plan 1. Oropharyngeal dysphagia Afebrile.choked on her food during lunch time 10/07/2017 per facility Nurse.no further choking episodes since then.Will  speech therapy evaluate her swallowing.continue on Aspiration precaution.   2. Nonproductive cough Afebrile.right upper lobe rhonchi suspect possible aspiration from her episode of choking with food during lunch 10/07/2017 though also has hx of pulmonary emphysema.continue on Duoneb,Anoro and Dextromethorphan.Obtain portable chest X-ray 2 views to rule out aspiration pneumonia.Portable X-ray due to her Dementia and high risk for falls.continue to monitor.    Family/ staff Communication: Reviewed plan of care with patient and facility Nurse   Labs/tests ordered: portable chest X-ray 2 views to rule out aspiration pneumonia.  Sandrea Hughs, NP

## 2017-10-09 DIAGNOSIS — I82502 Chronic embolism and thrombosis of unspecified deep veins of left lower extremity: Secondary | ICD-10-CM | POA: Diagnosis not present

## 2017-10-09 DIAGNOSIS — G309 Alzheimer's disease, unspecified: Secondary | ICD-10-CM | POA: Diagnosis not present

## 2017-10-09 DIAGNOSIS — M6281 Muscle weakness (generalized): Secondary | ICD-10-CM | POA: Diagnosis not present

## 2017-10-09 DIAGNOSIS — K219 Gastro-esophageal reflux disease without esophagitis: Secondary | ICD-10-CM | POA: Diagnosis not present

## 2017-10-09 DIAGNOSIS — R1312 Dysphagia, oropharyngeal phase: Secondary | ICD-10-CM | POA: Diagnosis not present

## 2017-10-09 DIAGNOSIS — E785 Hyperlipidemia, unspecified: Secondary | ICD-10-CM | POA: Diagnosis not present

## 2017-10-09 DIAGNOSIS — G4700 Insomnia, unspecified: Secondary | ICD-10-CM | POA: Diagnosis not present

## 2017-10-10 DIAGNOSIS — G309 Alzheimer's disease, unspecified: Secondary | ICD-10-CM | POA: Diagnosis not present

## 2017-10-10 DIAGNOSIS — G4700 Insomnia, unspecified: Secondary | ICD-10-CM | POA: Diagnosis not present

## 2017-10-10 DIAGNOSIS — E785 Hyperlipidemia, unspecified: Secondary | ICD-10-CM | POA: Diagnosis not present

## 2017-10-10 DIAGNOSIS — I82502 Chronic embolism and thrombosis of unspecified deep veins of left lower extremity: Secondary | ICD-10-CM | POA: Diagnosis not present

## 2017-10-10 DIAGNOSIS — K219 Gastro-esophageal reflux disease without esophagitis: Secondary | ICD-10-CM | POA: Diagnosis not present

## 2017-10-10 DIAGNOSIS — R1312 Dysphagia, oropharyngeal phase: Secondary | ICD-10-CM | POA: Diagnosis not present

## 2017-10-12 DIAGNOSIS — G309 Alzheimer's disease, unspecified: Secondary | ICD-10-CM | POA: Diagnosis not present

## 2017-10-12 DIAGNOSIS — I82502 Chronic embolism and thrombosis of unspecified deep veins of left lower extremity: Secondary | ICD-10-CM | POA: Diagnosis not present

## 2017-10-12 DIAGNOSIS — R1312 Dysphagia, oropharyngeal phase: Secondary | ICD-10-CM | POA: Diagnosis not present

## 2017-10-12 DIAGNOSIS — E785 Hyperlipidemia, unspecified: Secondary | ICD-10-CM | POA: Diagnosis not present

## 2017-10-12 DIAGNOSIS — K219 Gastro-esophageal reflux disease without esophagitis: Secondary | ICD-10-CM | POA: Diagnosis not present

## 2017-10-12 DIAGNOSIS — G4700 Insomnia, unspecified: Secondary | ICD-10-CM | POA: Diagnosis not present

## 2017-10-14 DIAGNOSIS — G309 Alzheimer's disease, unspecified: Secondary | ICD-10-CM | POA: Diagnosis not present

## 2017-10-14 DIAGNOSIS — E785 Hyperlipidemia, unspecified: Secondary | ICD-10-CM | POA: Diagnosis not present

## 2017-10-14 DIAGNOSIS — I82502 Chronic embolism and thrombosis of unspecified deep veins of left lower extremity: Secondary | ICD-10-CM | POA: Diagnosis not present

## 2017-10-14 DIAGNOSIS — R1312 Dysphagia, oropharyngeal phase: Secondary | ICD-10-CM | POA: Diagnosis not present

## 2017-10-14 DIAGNOSIS — K219 Gastro-esophageal reflux disease without esophagitis: Secondary | ICD-10-CM | POA: Diagnosis not present

## 2017-10-14 DIAGNOSIS — G4700 Insomnia, unspecified: Secondary | ICD-10-CM | POA: Diagnosis not present

## 2017-10-16 DIAGNOSIS — R1312 Dysphagia, oropharyngeal phase: Secondary | ICD-10-CM | POA: Diagnosis not present

## 2017-10-16 DIAGNOSIS — G309 Alzheimer's disease, unspecified: Secondary | ICD-10-CM | POA: Diagnosis not present

## 2017-10-16 DIAGNOSIS — G4700 Insomnia, unspecified: Secondary | ICD-10-CM | POA: Diagnosis not present

## 2017-10-16 DIAGNOSIS — E785 Hyperlipidemia, unspecified: Secondary | ICD-10-CM | POA: Diagnosis not present

## 2017-10-16 DIAGNOSIS — I82502 Chronic embolism and thrombosis of unspecified deep veins of left lower extremity: Secondary | ICD-10-CM | POA: Diagnosis not present

## 2017-10-16 DIAGNOSIS — K219 Gastro-esophageal reflux disease without esophagitis: Secondary | ICD-10-CM | POA: Diagnosis not present

## 2017-10-19 DIAGNOSIS — G309 Alzheimer's disease, unspecified: Secondary | ICD-10-CM | POA: Diagnosis not present

## 2017-10-19 DIAGNOSIS — E785 Hyperlipidemia, unspecified: Secondary | ICD-10-CM | POA: Diagnosis not present

## 2017-10-19 DIAGNOSIS — R1312 Dysphagia, oropharyngeal phase: Secondary | ICD-10-CM | POA: Diagnosis not present

## 2017-10-19 DIAGNOSIS — G4700 Insomnia, unspecified: Secondary | ICD-10-CM | POA: Diagnosis not present

## 2017-10-19 DIAGNOSIS — K219 Gastro-esophageal reflux disease without esophagitis: Secondary | ICD-10-CM | POA: Diagnosis not present

## 2017-10-19 DIAGNOSIS — I82502 Chronic embolism and thrombosis of unspecified deep veins of left lower extremity: Secondary | ICD-10-CM | POA: Diagnosis not present

## 2017-10-20 DIAGNOSIS — G4700 Insomnia, unspecified: Secondary | ICD-10-CM | POA: Diagnosis not present

## 2017-10-20 DIAGNOSIS — E785 Hyperlipidemia, unspecified: Secondary | ICD-10-CM | POA: Diagnosis not present

## 2017-10-20 DIAGNOSIS — K219 Gastro-esophageal reflux disease without esophagitis: Secondary | ICD-10-CM | POA: Diagnosis not present

## 2017-10-20 DIAGNOSIS — R1312 Dysphagia, oropharyngeal phase: Secondary | ICD-10-CM | POA: Diagnosis not present

## 2017-10-20 DIAGNOSIS — G309 Alzheimer's disease, unspecified: Secondary | ICD-10-CM | POA: Diagnosis not present

## 2017-10-20 DIAGNOSIS — I82502 Chronic embolism and thrombosis of unspecified deep veins of left lower extremity: Secondary | ICD-10-CM | POA: Diagnosis not present

## 2017-10-21 DIAGNOSIS — G4700 Insomnia, unspecified: Secondary | ICD-10-CM | POA: Diagnosis not present

## 2017-10-21 DIAGNOSIS — I82502 Chronic embolism and thrombosis of unspecified deep veins of left lower extremity: Secondary | ICD-10-CM | POA: Diagnosis not present

## 2017-10-21 DIAGNOSIS — G309 Alzheimer's disease, unspecified: Secondary | ICD-10-CM | POA: Diagnosis not present

## 2017-10-21 DIAGNOSIS — E785 Hyperlipidemia, unspecified: Secondary | ICD-10-CM | POA: Diagnosis not present

## 2017-10-21 DIAGNOSIS — R1312 Dysphagia, oropharyngeal phase: Secondary | ICD-10-CM | POA: Diagnosis not present

## 2017-10-21 DIAGNOSIS — K219 Gastro-esophageal reflux disease without esophagitis: Secondary | ICD-10-CM | POA: Diagnosis not present

## 2017-10-23 DIAGNOSIS — I82502 Chronic embolism and thrombosis of unspecified deep veins of left lower extremity: Secondary | ICD-10-CM | POA: Diagnosis not present

## 2017-10-23 DIAGNOSIS — G309 Alzheimer's disease, unspecified: Secondary | ICD-10-CM | POA: Diagnosis not present

## 2017-10-23 DIAGNOSIS — R1312 Dysphagia, oropharyngeal phase: Secondary | ICD-10-CM | POA: Diagnosis not present

## 2017-10-23 DIAGNOSIS — E785 Hyperlipidemia, unspecified: Secondary | ICD-10-CM | POA: Diagnosis not present

## 2017-10-23 DIAGNOSIS — K219 Gastro-esophageal reflux disease without esophagitis: Secondary | ICD-10-CM | POA: Diagnosis not present

## 2017-10-23 DIAGNOSIS — G4700 Insomnia, unspecified: Secondary | ICD-10-CM | POA: Diagnosis not present

## 2017-10-26 DIAGNOSIS — I82502 Chronic embolism and thrombosis of unspecified deep veins of left lower extremity: Secondary | ICD-10-CM | POA: Diagnosis not present

## 2017-10-26 DIAGNOSIS — G4700 Insomnia, unspecified: Secondary | ICD-10-CM | POA: Diagnosis not present

## 2017-10-26 DIAGNOSIS — G309 Alzheimer's disease, unspecified: Secondary | ICD-10-CM | POA: Diagnosis not present

## 2017-10-26 DIAGNOSIS — E785 Hyperlipidemia, unspecified: Secondary | ICD-10-CM | POA: Diagnosis not present

## 2017-10-26 DIAGNOSIS — K219 Gastro-esophageal reflux disease without esophagitis: Secondary | ICD-10-CM | POA: Diagnosis not present

## 2017-10-26 DIAGNOSIS — R1312 Dysphagia, oropharyngeal phase: Secondary | ICD-10-CM | POA: Diagnosis not present

## 2017-10-27 DIAGNOSIS — E785 Hyperlipidemia, unspecified: Secondary | ICD-10-CM | POA: Diagnosis not present

## 2017-10-27 DIAGNOSIS — G309 Alzheimer's disease, unspecified: Secondary | ICD-10-CM | POA: Diagnosis not present

## 2017-10-27 DIAGNOSIS — I82502 Chronic embolism and thrombosis of unspecified deep veins of left lower extremity: Secondary | ICD-10-CM | POA: Diagnosis not present

## 2017-10-27 DIAGNOSIS — G4700 Insomnia, unspecified: Secondary | ICD-10-CM | POA: Diagnosis not present

## 2017-10-27 DIAGNOSIS — R1312 Dysphagia, oropharyngeal phase: Secondary | ICD-10-CM | POA: Diagnosis not present

## 2017-10-27 DIAGNOSIS — K219 Gastro-esophageal reflux disease without esophagitis: Secondary | ICD-10-CM | POA: Diagnosis not present

## 2017-10-29 DIAGNOSIS — K219 Gastro-esophageal reflux disease without esophagitis: Secondary | ICD-10-CM | POA: Diagnosis not present

## 2017-10-29 DIAGNOSIS — E785 Hyperlipidemia, unspecified: Secondary | ICD-10-CM | POA: Diagnosis not present

## 2017-10-29 DIAGNOSIS — G4700 Insomnia, unspecified: Secondary | ICD-10-CM | POA: Diagnosis not present

## 2017-10-29 DIAGNOSIS — R05 Cough: Secondary | ICD-10-CM | POA: Diagnosis not present

## 2017-10-29 DIAGNOSIS — G309 Alzheimer's disease, unspecified: Secondary | ICD-10-CM | POA: Diagnosis not present

## 2017-10-29 DIAGNOSIS — I82502 Chronic embolism and thrombosis of unspecified deep veins of left lower extremity: Secondary | ICD-10-CM | POA: Diagnosis not present

## 2017-10-29 DIAGNOSIS — R1312 Dysphagia, oropharyngeal phase: Secondary | ICD-10-CM | POA: Diagnosis not present

## 2017-10-30 DIAGNOSIS — K219 Gastro-esophageal reflux disease without esophagitis: Secondary | ICD-10-CM | POA: Diagnosis not present

## 2017-10-30 DIAGNOSIS — E785 Hyperlipidemia, unspecified: Secondary | ICD-10-CM | POA: Diagnosis not present

## 2017-10-30 DIAGNOSIS — R1312 Dysphagia, oropharyngeal phase: Secondary | ICD-10-CM | POA: Diagnosis not present

## 2017-10-30 DIAGNOSIS — I82502 Chronic embolism and thrombosis of unspecified deep veins of left lower extremity: Secondary | ICD-10-CM | POA: Diagnosis not present

## 2017-10-30 DIAGNOSIS — G309 Alzheimer's disease, unspecified: Secondary | ICD-10-CM | POA: Diagnosis not present

## 2017-10-30 DIAGNOSIS — G4700 Insomnia, unspecified: Secondary | ICD-10-CM | POA: Diagnosis not present

## 2017-11-03 DIAGNOSIS — R1312 Dysphagia, oropharyngeal phase: Secondary | ICD-10-CM | POA: Diagnosis not present

## 2017-11-03 DIAGNOSIS — I82502 Chronic embolism and thrombosis of unspecified deep veins of left lower extremity: Secondary | ICD-10-CM | POA: Diagnosis not present

## 2017-11-03 DIAGNOSIS — E785 Hyperlipidemia, unspecified: Secondary | ICD-10-CM | POA: Diagnosis not present

## 2017-11-03 DIAGNOSIS — G309 Alzheimer's disease, unspecified: Secondary | ICD-10-CM | POA: Diagnosis not present

## 2017-11-03 DIAGNOSIS — G4700 Insomnia, unspecified: Secondary | ICD-10-CM | POA: Diagnosis not present

## 2017-11-03 DIAGNOSIS — K219 Gastro-esophageal reflux disease without esophagitis: Secondary | ICD-10-CM | POA: Diagnosis not present

## 2017-11-24 ENCOUNTER — Non-Acute Institutional Stay: Payer: Medicare Other | Admitting: Family Medicine

## 2017-11-24 ENCOUNTER — Encounter: Payer: Self-pay | Admitting: Family Medicine

## 2017-11-24 DIAGNOSIS — J441 Chronic obstructive pulmonary disease with (acute) exacerbation: Secondary | ICD-10-CM | POA: Diagnosis not present

## 2017-11-24 NOTE — Progress Notes (Addendum)
Provider:  Alain Honey, MD Location:  [place] Room Number: 17A Place of Service:  ALF [phone])  PCP: Ngetich, Nelda Bucks, NP Patient Care Team: Ngetich, Nelda Bucks, NP as PCP - General (Family Medicine) McKenzie, Lilia Argue, MD (Inactive) (Anesthesiology) Kristeen Miss, MD as Consulting Physician (Neurosurgery) Shon Hough, MD as Consulting Physician (Ophthalmology) [name], [facility] ([place]) [name], MD as Consulting Physician (Dermatology)  Extended Emergency Contact Information Primary Emergency Contact: [name] Address: [address]          [name] Phone: [phone] Mobile Phone: [phone] Relation: Son Secondary Emergency Contact: Danija, G[place] Mobile Phone: [phone] Relation: Daughter Preferred language: English Interpreter needed? No  Code Status: DNR Goals of Care: Advanced Directive information Advanced Directives 11/24/2017  Does Patient Have a Medical Advance Directive? Yes  Type of Paramedic of [place];Living will;Out of facility DNR (pink MOST or yellow form)  Does patient want to make changes to medical advance directive? No - Patient declined  Copy of [place] in Chart? Yes - validated most recent copy scanned in chart (See row information)  Would patient like information on creating a medical advance directive? -  Pre-existing out of facility DNR order (yellow form or pink MOST form) Yellow form placed in chart (order not valid for inpatient use)      Chief Complaint  Patient presents with  . Medical Management of Chronic Issues    Routine Visit    HPI: Patient is a 84 y.o. female seen today for medical management of chronic problems including: Cough and wheezing.  Patient has history of COPD.  Nurse had requested to visit today for persistent symptoms.  In reviewing her medicines  she is on inhaler and bronchodilator has not had fever  Past Medical History:  Diagnosis Date  . Allergic rhinitis   . Asthma   . Carpal tunnel syndrome 1980  . Chronic low back pain   . Constipation 06/27/2014  . GERD (gastroesophageal reflux disease)   . Hearing loss    Hearing aids  . Hyperglycemia 06/27/2014  . Hyperlipidemia 06/27/2014  . Hypertension   . Insomnia 06/27/2014  . Left leg DVT ([place])    chronic since 2008  . Memory loss 06/27/2014  . Meningioma ([place]) 2000   Dr. Ellene Route  . Osteopenia    Past Surgical History:  Procedure Laterality Date  . BACK SURGERY  1999   ruptured disk  . BRAIN TUMOR EXCISION  2000   Dr. Ellene Route  . CARPAL TUNNEL RELEASE  1980  . TEAR DUCT PROBING Bilateral 2014   Saluidon    reports that she has never smoked. She has never used smokeless tobacco. She reports that she does not drink alcohol or use drugs. Social History   Socioeconomic History  . Marital status: Widowed    Spouse name: Not on file  . Number of children: Not on file  . Years of education: Not on file  . Highest education level: Not on file  Occupational History  . Occupation: housewife  Social Needs  . Financial resource strain: Not hard at all  . Food insecurity:    Worry: Never true    Inability: Never true  . Transportation needs:    Medical: No    Non-medical: No  Tobacco Use  . Smoking status: Never Smoker  . Smokeless tobacco: Never Used  Substance and Sexual Activity  .  Alcohol use: No  . Drug use: No  . Sexual activity: Never  Lifestyle  . Physical activity:    Days per week: 7 days    Minutes per session: 30 min  . Stress: Not at all  Relationships  . Social connections:    Talks on phone: More than three times a week    Gets together: Never    Attends religious service: Never    Active member of club or organization: No    Attends meetings of clubs or organizations: Never    Relationship status: Widowed  . Intimate partner violence:    Fear  of current or ex partner: No    Emotionally abused: No    Physically abused: No    Forced sexual activity: No  Other Topics Concern  . Not on file  Social History Narrative   Lives at [facility] since 12/05/2010, moved to [place] 03/23/15   Widowed   Never smoked   Alcohol none   Exercise -walking daily, walks with walker    Living Will, POA    Functional Status Survey:    Family History  Problem Relation Age of Onset  . CVA Mother   . Cancer Father        prostate     Health Maintenance  Topic Date Due  . FOOT EXAM  10/07/1932  . OPHTHALMOLOGY EXAM  02/27/2018  . HEMOGLOBIN A1C  03/25/2018  . TETANUS/TDAP  10/10/2020  . INFLUENZA VACCINE  Completed  . DEXA SCAN  Completed  . PNA vac Low Risk Adult  Completed    No Known Allergies  Outpatient Encounter Medications as of 11/24/2017  Medication Sig  . amLODipine (NORVASC) 5 MG tablet Take 5 mg by mouth every morning.  Marland Kitchen atorvastatin (LIPITOR) 10 MG tablet Take 10 mg by mouth every morning.  Marland Kitchen Dextromethorphan-guaiFENesin 10-200 MG/5ML LIQD Take 10 mLs by mouth every 4 (four) hours as needed.  . docusate sodium (COLACE) 100 MG capsule Take 100 mg by mouth daily.   Marland Kitchen donepezil (ARICEPT) 5 MG tablet Take 5 mg by mouth at bedtime.  . fluticasone (FLONASE) 50 MCG/ACT nasal spray Place 2 sprays into both nostrils daily.   Marland Kitchen ipratropium-albuterol (DUONEB) 0.5-2.5 (3) MG/3ML SOLN Take 3 mLs by nebulization 2 (two) times daily. while awake for wheezing  . levothyroxine (SYNTHROID, LEVOTHROID) 75 MCG tablet Take 75 mcg by mouth daily before breakfast.  . linagliptin (TRADJENTA) 5 MG TABS tablet Take 5 mg by mouth every morning.   Marland Kitchen lisinopril (PRINIVIL,ZESTRIL) 10 MG tablet Take 1 tablet (10 mg total) by mouth daily.  . memantine (NAMENDA) 5 MG tablet Take 5 mg by mouth 2 (two) times daily.  . Multiple Vitamins-Minerals (ICAPS AREDS 2) CAPS Take 1 tablet by mouth 2 (two) times daily.  Marland Kitchen omeprazole (PRILOSEC) 20 MG capsule  Take 1 capsule (20 mg total) by mouth daily.  Marland Kitchen umeclidinium-vilanterol (ANORO ELLIPTA) 62.5-25 MCG/INH AEPB Inhale 1 puff into the lungs daily.  . [DISCONTINUED] amLODipine-atorvastatin (CADUET) 5-10 MG tablet TAKE ONE TABLET DAILY FOR BLOOD PRESSURE   No facility-administered encounter medications on file as of 11/24/2017.     Review of Systems  Unable to perform ROS: Dementia    Vitals:   11/24/17 1125  BP: (!) 141/74  Pulse: 86  Resp: (!) 22  Temp: 98.8 F (37.1 C)  TempSrc: Oral  SpO2: 94%  Weight: 159 lb (72.1 kg)  Height: 5\' 5"  (1.651 m)   Body mass index is  26.46 kg/m. Physical Exam  Constitutional: She appears well-developed and well-nourished.  HENT:  Mouth/Throat: Oropharynx is clear and moist.  Eyes: Pupils are equal, round, and reactive to light.  Cardiovascular: Normal rate. Exam reveals gallop.  Murmur heard. Pulmonary/Chest: Effort normal. She has wheezes.  Neurological: She is alert.  Oriented to name  Psychiatric: She has a normal mood and affect.  Nursing note and vitals reviewed.   Labs reviewed: Basic Metabolic Panel: Recent Labs    03/05/17 03/30/17 06/18/17 09/17/17 09/24/17  NA 139  139 138 141  141 138 139  K 4.2  4.2 4.3 4.5  4.5 4.4  --   CL 103 103 102  --   --   CO2 27 29 29   --   --   BUN 29* 27* 27* 22* 19  CREATININE 1.3*  1.32 1.2* 1.3*  1.26 1.34 1.2*  CALCIUM 9.4  9.4 9.4 9.2  9.2 9.0  --    Liver Function Tests: Recent Labs    03/05/17 06/18/17 09/24/17  AST 13  13 13 14   ALT 12  12 16 15   ALKPHOS 60  60 64 66  BILITOT 0.4 0.4  --   PROT 6.1  6.1 6.4  6.4  --   ALBUMIN 3.8  3.8 3.9  3.9  --    No results for input(s): LIPASE, AMYLASE in the last 8760 hours. No results for input(s): AMMONIA in the last 8760 hours. CBC: Recent Labs    06/18/17 09/24/17  WBC 8.0  8 9.2  NEUTROABS 4,016 4,618  HGB 11.8*  11.8 12.3  HCT 35*  34.7 37  MCV 92.3  --   PLT 261 273   Cardiac Enzymes: No results for  input(s): CKTOTAL, CKMB, CKMBINDEX, TROPONINI in the last 8760 hours. BNP: Invalid input(s): POCBNP Lab Results  Component Value Date   HGBA1C 7.4 09/24/2017   Lab Results  Component Value Date   TSH 4.31 09/24/2017   No results found for: VITAMINB12 No results found for: FOLATE No results found for: IRON, TIBC, FERRITIN  Imaging and Procedures obtained prior to SNF admission: Dg Chest 2 View  Result Date: 02/03/2015 CLINICAL DATA:  Multiple recent falls EXAM: CHEST  2 VIEW COMPARISON:  None. FINDINGS: The heart size and mediastinal contours are within normal limits. Diffuse aortic calcifications are noted. Both lungs are clear. The visualized skeletal structures are unremarkable. IMPRESSION: No active cardiopulmonary disease. Electronically Signed   By: Inez Catalina M.D.   On: 02/03/2015 12:37   Ct Head Wo Contrast  Result Date: 02/03/2015 CLINICAL DATA:  Fall 3 times today.  Falls unwitnessed. EXAM: CT HEAD WITHOUT CONTRAST TECHNIQUE: Contiguous axial images were obtained from the base of the skull through the vertex without intravenous contrast. COMPARISON:  Brain MRI 02/02/2008 FINDINGS: Stable meningioma at the base of the brain near the left sphenoid bone, measuring up to 2.4 cm compared to 2.5 cm previously. There is atrophy and chronic small vessel disease changes. No hemorrhage, hydrocephalus or acute infarction. No midline shift. Postoperative changes from craniectomy in the right occipital region. No acute calvarial abnormality. Visualized paranasal sinuses and mastoids clear. Orbital soft tissues unremarkable. IMPRESSION: Left skullbase meningioma, stable. No acute intracranial abnormality. Atrophy, chronic microvascular disease. Electronically Signed   By: Rolm Baptise M.D.   On: 02/03/2015 12:12    Assessment/Plan 1). Pulmonary emphysema Patient does not appear to have acute infection, either bronchitis or pneumonia.  She is in no distress at the time of  my visit or some  wheezes apparent.  To try her on a tapered course of prednisone to see if that helps her symptoms.  If symptoms are improved I need inhaler with steroid as well 1. Acute exacerbation of chronic obstructive pulmonary disease (COPD) ([place]) As above; will try on course steroids and if helpful, inhaled steroid  Family/ staff Communication: Findings communicated to staff  Labs/tests ordered:  Lillette Boxer. Sabra Heck, [place] [address] [place], [place] Office [phone]

## 2017-11-25 DIAGNOSIS — J441 Chronic obstructive pulmonary disease with (acute) exacerbation: Secondary | ICD-10-CM | POA: Insufficient documentation

## 2017-12-01 DIAGNOSIS — N390 Urinary tract infection, site not specified: Secondary | ICD-10-CM | POA: Diagnosis not present

## 2017-12-16 ENCOUNTER — Encounter: Payer: Self-pay | Admitting: Family

## 2017-12-16 ENCOUNTER — Non-Acute Institutional Stay: Payer: Medicare Other | Admitting: Family

## 2017-12-16 DIAGNOSIS — J439 Emphysema, unspecified: Secondary | ICD-10-CM | POA: Diagnosis not present

## 2017-12-16 DIAGNOSIS — K219 Gastro-esophageal reflux disease without esophagitis: Secondary | ICD-10-CM

## 2017-12-16 DIAGNOSIS — E1122 Type 2 diabetes mellitus with diabetic chronic kidney disease: Secondary | ICD-10-CM

## 2017-12-16 DIAGNOSIS — E785 Hyperlipidemia, unspecified: Secondary | ICD-10-CM

## 2017-12-16 DIAGNOSIS — N183 Chronic kidney disease, stage 3 unspecified: Secondary | ICD-10-CM

## 2017-12-16 DIAGNOSIS — E039 Hypothyroidism, unspecified: Secondary | ICD-10-CM | POA: Diagnosis not present

## 2017-12-16 DIAGNOSIS — I129 Hypertensive chronic kidney disease with stage 1 through stage 4 chronic kidney disease, or unspecified chronic kidney disease: Secondary | ICD-10-CM

## 2017-12-16 NOTE — Progress Notes (Signed)
Location:  [facility] Room Number: 17 Place of Service:  [phone]) Provider: Quill Grinder FNP-C   Trinidy Masterson, Nelda Bucks, NP  Patient Care Team: Mayo Owczarzak, Nelda Bucks, NP as PCP - General (Family Medicine) McKenzie, Lilia Argue, MD (Inactive) (Anesthesiology) Kristeen Miss, MD as Consulting Physician (Neurosurgery) Shon Hough, MD as Consulting Physician (Ophthalmology) [name], [facility] (Skilled Nursing and [place]) [name], MD as Consulting Physician (Dermatology)  Extended Emergency Contact Information Primary Emergency Contact: [name] Address: [address]          [name] Phone: [phone] Mobile Phone: [phone] Relation: Son Secondary Emergency Contact: Den[place], [place] Mobile Phone: [phone] Relation: Daughter Preferred language: English Interpreter needed? No  Code Status:  DNR Goals of care: Advanced Directive information Advanced Directives 12/16/2017  Does Patient Have a Medical Advance Directive? Yes  Type of Paramedic of [place];Living will;Out of facility DNR (pink MOST or yellow form)  Does patient want to make changes to medical advance directive? No - Patient declined  Copy of [place] in Chart? Yes - validated most recent copy scanned in chart (See row information)  Would patient like information on creating a medical advance directive? -  Pre-existing out of facility DNR order (yellow form or pink MOST form) Yellow form placed in chart (order not valid for inpatient use)     Chief Complaint  Patient presents with  . Medical Management of Chronic Issues    Routine Visit     HPI:  Pt is a 84 y.o. female seen today [place] for medical management of chronic diseases.she has a medical history of Hypertension,Type 2 DM,CKD stage 3,hypothyroidism,Pulmonary emphysema,Hyperlipidemia among other  conditions.she is seen in her room today with facility Nurse and patient's seater present at bedside.she denies any acute issues.Nurse reports no new acute concerns.No recent fall episodes or weight changes.her CBG reviewed readings ranging 120's-160's.     Past Medical History:  Diagnosis Date  . Allergic rhinitis   . Asthma   . Carpal tunnel syndrome 1980  . Chronic low back pain   . Constipation 06/27/2014  . GERD (gastroesophageal reflux disease)   . Hearing loss    Hearing aids  . Hyperglycemia 06/27/2014  . Hyperlipidemia 06/27/2014  . Hypertension   . Insomnia 06/27/2014  . Left leg DVT ([place])    chronic since 2008  . Memory loss 06/27/2014  . Meningioma ([place]) 2000   Dr. Ellene Route  . Osteopenia    Past Surgical History:  Procedure Laterality Date  . BACK SURGERY  1999   ruptured disk  . BRAIN TUMOR EXCISION  2000   Dr. Ellene Route  . CARPAL TUNNEL RELEASE  1980  . TEAR DUCT PROBING Bilateral 2014   [place]    No Known Allergies  Allergies as of 12/16/2017   No Known Allergies     Medication List        Accurate as of 12/16/17  8:02 PM. Always use your most recent med list.          amLODipine 5 MG tablet Commonly known as:  NORVASC Take 5 mg by mouth every morning.   atorvastatin 10 MG tablet Commonly known as:  LIPITOR Take 10 mg by mouth every morning.   Dextromethorphan-guaiFENesin 10-200 MG/5ML Liqd Take 10 mLs by mouth every 4 (four) hours as needed.   docusate sodium 100 MG capsule Commonly known as:  COLACE Take 100 mg  by mouth daily.   donepezil 5 MG tablet Commonly known as:  ARICEPT Take 5 mg by mouth at bedtime.   fluticasone 50 MCG/ACT nasal spray Commonly known as:  FLONASE Place 2 sprays into both nostrils daily.   ICAPS AREDS 2 Caps Take 1 tablet by mouth 2 (two) times daily.   ipratropium-albuterol 0.5-2.5 (3) MG/3ML Soln Commonly known as:  DUONEB Take 3 mLs by nebulization 2 (two) times daily. while awake for wheezing     levothyroxine 75 MCG tablet Commonly known as:  SYNTHROID, LEVOTHROID Take 75 mcg by mouth daily before breakfast.   linagliptin 5 MG Tabs tablet Commonly known as:  TRADJENTA Take 5 mg by mouth every morning.   memantine 5 MG tablet Commonly known as:  NAMENDA Take 5 mg by mouth 2 (two) times daily.   omeprazole 20 MG capsule Commonly known as:  PRILOSEC Take 1 capsule (20 mg total) by mouth daily.       Review of Systems  Unable to perform ROS: Dementia (additional information provided by facility Nurse )  Constitutional: Negative for appetite change, chills, fatigue, fever and unexpected weight change.  HENT: Positive for hearing loss. Negative for congestion, rhinorrhea, sinus pressure, sinus pain, sneezing, sore throat and trouble swallowing.   Eyes: Negative for discharge, redness and itching.  Respiratory: Negative for cough, chest tightness, shortness of breath and wheezing.   Cardiovascular: Negative for chest pain, palpitations and leg swelling.  Gastrointestinal: Negative for abdominal distention, abdominal pain, constipation, diarrhea, nausea and vomiting.  Endocrine: Negative for cold intolerance, heat intolerance, polydipsia, polyphagia and polyuria.  Genitourinary: Negative for dysuria, flank pain and urgency.       Incontinent wears pull ups   Musculoskeletal: Positive for gait problem.  Skin: Negative for color change, pallor, rash and wound.  Neurological: Negative for dizziness, light-headedness and headaches.  Hematological: Does not bruise/bleed easily.  Psychiatric/Behavioral: Negative for agitation, confusion and sleep disturbance. The patient is not nervous/anxious.     Immunization History  Administered Date(s) Administered  . Influenza-Unspecified 10/20/2013, 10/05/2014, 10/18/2015, 10/15/2016, 10/12/2017  . PPD Test 10/10/2013, 03/23/2015, 04/10/2015  . Pneumococcal Polysaccharide-23 02/06/2009  . Td 10/11/2010  . Zoster 01/08/2012   Pertinent   Health Maintenance Due  Topic Date Due  . FOOT EXAM  10/07/1932  . OPHTHALMOLOGY EXAM  02/27/2018  . HEMOGLOBIN A1C  03/25/2018  . INFLUENZA VACCINE  Completed  . DEXA SCAN  Completed  . PNA vac Low Risk Adult  Completed   Fall Risk  09/10/2017 08/27/2016 06/12/2015 06/12/2015 03/13/2015  Falls in the past year? No No No No Yes  Number falls in past yr: - - - - 1  Injury with Fall? - - - - No    Vitals:   12/16/17 1118  BP: (!) 159/63  Pulse: 90  Resp: 20  Temp: 97.8 F (36.6 C)  TempSrc: Oral  SpO2: 92%  Weight: 159 lb 6.4 oz (72.3 kg)  Height: 5\' 5"  (1.651 m)   Body mass index is 26.53 kg/m. Physical Exam  Constitutional: She appears well-developed and well-nourished. No distress.  HENT:  Head: Normocephalic.  Right Ear: External ear normal.  Left Ear: External ear normal.  Mouth/Throat: Oropharynx is clear and moist. No oropharyngeal exudate.  Eyes: Pupils are equal, round, and reactive to light. Conjunctivae and EOM are normal. Right eye exhibits no discharge. Left eye exhibits no discharge. No scleral icterus.  Neck: Normal range of motion. No JVD present. No thyromegaly present.  Cardiovascular: Normal  rate, regular rhythm, normal heart sounds and intact distal pulses. Exam reveals no gallop and no friction rub.  No murmur heard. Pulmonary/Chest: Effort normal and breath sounds normal. No respiratory distress. She has no wheezes. She has no rales.  Abdominal: Soft. Bowel sounds are normal. She exhibits no distension and no mass. There is no tenderness. There is no rebound and no guarding.  Musculoskeletal: Normal range of motion. She exhibits no edema or tenderness.  Unsteady gait ambulates with front wheel walker   Lymphadenopathy:    She has no cervical adenopathy.  Neurological: Gait abnormal.  pleasantly confused at her baseline  Skin: Skin is warm and dry. No rash noted. No erythema. No pallor.  Skin intact   Psychiatric: She has a normal mood and affect. Her  speech is normal and behavior is normal. Judgment and thought content normal.  Nursing note and vitals reviewed.   Labs reviewed: Recent Labs    03/05/17 03/30/17 06/18/17 09/17/17 09/24/17  NA 139  139 138 141  141 138 139  K 4.2  4.2 4.3 4.5  4.5 4.4  --   CL 103 103 102  --   --   CO2 27 29 29   --   --   BUN 29* 27* 27* 22* 19  CREATININE 1.3*  1.32 1.2* 1.3*  1.26 1.34 1.2*  CALCIUM 9.4  9.4 9.4 9.2  9.2 9.0  --    Recent Labs    03/05/17 06/18/17 09/24/17  AST 13  13 13 14   ALT 12  12 16 15   ALKPHOS 60  60 64 66  BILITOT 0.4 0.4  --   PROT 6.1  6.1 6.4  6.4  --   ALBUMIN 3.8  3.8 3.9  3.9  --    Recent Labs    06/18/17 09/24/17  WBC 8.0  8 9.2  NEUTROABS 4,016 4,618  HGB 11.8*  11.8 12.3  HCT 35*  34.7 37  MCV 92.3  --   PLT 261 273   Lab Results  Component Value Date   TSH 4.31 09/24/2017   Lab Results  Component Value Date   HGBA1C 7.4 09/24/2017   Lab Results  Component Value Date   CHOL 172 06/18/2017   CHOL 172 06/18/2017   HDL 41 06/18/2017   LDLCALC 100 06/18/2017   LDLCALC 100 06/18/2017   TRIG 193 06/18/2017   TRIG 193 (A) 06/18/2017    Significant Diagnostic Results in last 30 days:  No results found.  Assessment/Plan 1. Benign hypertension with CKD (chronic kidney disease) stage III (HCC) B/p reviewed stable.continue on amlodipine 5 mg tablet daily  2. Type 2 diabetes mellitus with stage 3 chronic kidney disease, without long-term current use of insulin (HCC) Lab Results  Component Value Date   HGBA1C 7.4 09/24/2017  CBG controlled.continue on Tradjenta 5 mg tablet daily.on statin for stroke prevention. Off ASA due to high risk for falls.Up to date with annual eye and foot exam.check urine microalbuminuria   3. Pulmonary emphysema, unspecified emphysema type ([place]) Breathing stable.continue on Duoneb twice daily and Dextromethorphan as needed.  4. Gastroesophageal reflux disease without  esophagitis Asymptomatic.continue on omeprazole daily.  5. Acquired hypothyroidism Lab Results  Component Value Date   TSH 4.31 09/24/2017  Continue on levothyroxine 75 mcg tablet daily.monitor TSH level.    6. Hyperlipidemia LDL goal <70 Latest LDL not at goal.continue on atorvastatin 10 mg tablet daily.  Family/ staff Communication: Reviewed plan of care with patient and facility Nurse  supervisor   Labs/tests ordered: Urine Microalbuminuria   Nelda Bucks Asiah Browder, NP

## 2017-12-18 DIAGNOSIS — R802 Orthostatic proteinuria, unspecified: Secondary | ICD-10-CM | POA: Diagnosis not present

## 2018-02-02 ENCOUNTER — Encounter: Payer: Self-pay | Admitting: Nurse Practitioner

## 2018-02-08 DIAGNOSIS — N3946 Mixed incontinence: Secondary | ICD-10-CM | POA: Diagnosis not present

## 2018-02-08 DIAGNOSIS — R2681 Unsteadiness on feet: Secondary | ICD-10-CM | POA: Diagnosis not present

## 2018-02-08 DIAGNOSIS — M6281 Muscle weakness (generalized): Secondary | ICD-10-CM | POA: Diagnosis not present

## 2018-02-08 DIAGNOSIS — I82502 Chronic embolism and thrombosis of unspecified deep veins of left lower extremity: Secondary | ICD-10-CM | POA: Diagnosis not present

## 2018-02-08 DIAGNOSIS — G4700 Insomnia, unspecified: Secondary | ICD-10-CM | POA: Diagnosis not present

## 2018-02-08 DIAGNOSIS — E785 Hyperlipidemia, unspecified: Secondary | ICD-10-CM | POA: Diagnosis not present

## 2018-02-09 DIAGNOSIS — I82502 Chronic embolism and thrombosis of unspecified deep veins of left lower extremity: Secondary | ICD-10-CM | POA: Diagnosis not present

## 2018-02-09 DIAGNOSIS — G4700 Insomnia, unspecified: Secondary | ICD-10-CM | POA: Diagnosis not present

## 2018-02-09 DIAGNOSIS — M6281 Muscle weakness (generalized): Secondary | ICD-10-CM | POA: Diagnosis not present

## 2018-02-09 DIAGNOSIS — E785 Hyperlipidemia, unspecified: Secondary | ICD-10-CM | POA: Diagnosis not present

## 2018-02-09 DIAGNOSIS — R2681 Unsteadiness on feet: Secondary | ICD-10-CM | POA: Diagnosis not present

## 2018-02-09 DIAGNOSIS — N3946 Mixed incontinence: Secondary | ICD-10-CM | POA: Diagnosis not present

## 2018-02-10 DIAGNOSIS — E785 Hyperlipidemia, unspecified: Secondary | ICD-10-CM | POA: Diagnosis not present

## 2018-02-10 DIAGNOSIS — R2681 Unsteadiness on feet: Secondary | ICD-10-CM | POA: Diagnosis not present

## 2018-02-10 DIAGNOSIS — G4700 Insomnia, unspecified: Secondary | ICD-10-CM | POA: Diagnosis not present

## 2018-02-10 DIAGNOSIS — N3946 Mixed incontinence: Secondary | ICD-10-CM | POA: Diagnosis not present

## 2018-02-10 DIAGNOSIS — I82502 Chronic embolism and thrombosis of unspecified deep veins of left lower extremity: Secondary | ICD-10-CM | POA: Diagnosis not present

## 2018-02-10 DIAGNOSIS — M6281 Muscle weakness (generalized): Secondary | ICD-10-CM | POA: Diagnosis not present

## 2018-02-11 DIAGNOSIS — M6281 Muscle weakness (generalized): Secondary | ICD-10-CM | POA: Diagnosis not present

## 2018-02-11 DIAGNOSIS — N3946 Mixed incontinence: Secondary | ICD-10-CM | POA: Diagnosis not present

## 2018-02-11 DIAGNOSIS — I82502 Chronic embolism and thrombosis of unspecified deep veins of left lower extremity: Secondary | ICD-10-CM | POA: Diagnosis not present

## 2018-02-11 DIAGNOSIS — R2681 Unsteadiness on feet: Secondary | ICD-10-CM | POA: Diagnosis not present

## 2018-02-11 DIAGNOSIS — E785 Hyperlipidemia, unspecified: Secondary | ICD-10-CM | POA: Diagnosis not present

## 2018-02-11 DIAGNOSIS — G4700 Insomnia, unspecified: Secondary | ICD-10-CM | POA: Diagnosis not present

## 2018-02-12 DIAGNOSIS — G4700 Insomnia, unspecified: Secondary | ICD-10-CM | POA: Diagnosis not present

## 2018-02-12 DIAGNOSIS — E785 Hyperlipidemia, unspecified: Secondary | ICD-10-CM | POA: Diagnosis not present

## 2018-02-12 DIAGNOSIS — I82502 Chronic embolism and thrombosis of unspecified deep veins of left lower extremity: Secondary | ICD-10-CM | POA: Diagnosis not present

## 2018-02-12 DIAGNOSIS — M6281 Muscle weakness (generalized): Secondary | ICD-10-CM | POA: Diagnosis not present

## 2018-02-12 DIAGNOSIS — R2681 Unsteadiness on feet: Secondary | ICD-10-CM | POA: Diagnosis not present

## 2018-02-12 DIAGNOSIS — N3946 Mixed incontinence: Secondary | ICD-10-CM | POA: Diagnosis not present

## 2018-02-15 DIAGNOSIS — I82502 Chronic embolism and thrombosis of unspecified deep veins of left lower extremity: Secondary | ICD-10-CM | POA: Diagnosis not present

## 2018-02-15 DIAGNOSIS — M6281 Muscle weakness (generalized): Secondary | ICD-10-CM | POA: Diagnosis not present

## 2018-02-15 DIAGNOSIS — G4700 Insomnia, unspecified: Secondary | ICD-10-CM | POA: Diagnosis not present

## 2018-02-15 DIAGNOSIS — N3946 Mixed incontinence: Secondary | ICD-10-CM | POA: Diagnosis not present

## 2018-02-15 DIAGNOSIS — E785 Hyperlipidemia, unspecified: Secondary | ICD-10-CM | POA: Diagnosis not present

## 2018-02-15 DIAGNOSIS — R2681 Unsteadiness on feet: Secondary | ICD-10-CM | POA: Diagnosis not present

## 2018-02-16 DIAGNOSIS — M6281 Muscle weakness (generalized): Secondary | ICD-10-CM | POA: Diagnosis not present

## 2018-02-16 DIAGNOSIS — I82502 Chronic embolism and thrombosis of unspecified deep veins of left lower extremity: Secondary | ICD-10-CM | POA: Diagnosis not present

## 2018-02-16 DIAGNOSIS — G4700 Insomnia, unspecified: Secondary | ICD-10-CM | POA: Diagnosis not present

## 2018-02-16 DIAGNOSIS — E785 Hyperlipidemia, unspecified: Secondary | ICD-10-CM | POA: Diagnosis not present

## 2018-02-16 DIAGNOSIS — N3946 Mixed incontinence: Secondary | ICD-10-CM | POA: Diagnosis not present

## 2018-02-16 DIAGNOSIS — R2681 Unsteadiness on feet: Secondary | ICD-10-CM | POA: Diagnosis not present

## 2018-02-17 DIAGNOSIS — M6281 Muscle weakness (generalized): Secondary | ICD-10-CM | POA: Diagnosis not present

## 2018-02-17 DIAGNOSIS — N3946 Mixed incontinence: Secondary | ICD-10-CM | POA: Diagnosis not present

## 2018-02-17 DIAGNOSIS — I82502 Chronic embolism and thrombosis of unspecified deep veins of left lower extremity: Secondary | ICD-10-CM | POA: Diagnosis not present

## 2018-02-17 DIAGNOSIS — E785 Hyperlipidemia, unspecified: Secondary | ICD-10-CM | POA: Diagnosis not present

## 2018-02-17 DIAGNOSIS — R2681 Unsteadiness on feet: Secondary | ICD-10-CM | POA: Diagnosis not present

## 2018-02-17 DIAGNOSIS — G4700 Insomnia, unspecified: Secondary | ICD-10-CM | POA: Diagnosis not present

## 2018-02-18 DIAGNOSIS — M6281 Muscle weakness (generalized): Secondary | ICD-10-CM | POA: Diagnosis not present

## 2018-02-18 DIAGNOSIS — R2681 Unsteadiness on feet: Secondary | ICD-10-CM | POA: Diagnosis not present

## 2018-02-18 DIAGNOSIS — N3946 Mixed incontinence: Secondary | ICD-10-CM | POA: Diagnosis not present

## 2018-02-18 DIAGNOSIS — I82502 Chronic embolism and thrombosis of unspecified deep veins of left lower extremity: Secondary | ICD-10-CM | POA: Diagnosis not present

## 2018-02-18 DIAGNOSIS — E785 Hyperlipidemia, unspecified: Secondary | ICD-10-CM | POA: Diagnosis not present

## 2018-02-18 DIAGNOSIS — G4700 Insomnia, unspecified: Secondary | ICD-10-CM | POA: Diagnosis not present

## 2018-02-19 DIAGNOSIS — N3946 Mixed incontinence: Secondary | ICD-10-CM | POA: Diagnosis not present

## 2018-02-19 DIAGNOSIS — R2681 Unsteadiness on feet: Secondary | ICD-10-CM | POA: Diagnosis not present

## 2018-02-19 DIAGNOSIS — M6281 Muscle weakness (generalized): Secondary | ICD-10-CM | POA: Diagnosis not present

## 2018-02-19 DIAGNOSIS — E785 Hyperlipidemia, unspecified: Secondary | ICD-10-CM | POA: Diagnosis not present

## 2018-02-19 DIAGNOSIS — G4700 Insomnia, unspecified: Secondary | ICD-10-CM | POA: Diagnosis not present

## 2018-02-19 DIAGNOSIS — I82502 Chronic embolism and thrombosis of unspecified deep veins of left lower extremity: Secondary | ICD-10-CM | POA: Diagnosis not present

## 2018-02-22 DIAGNOSIS — R2681 Unsteadiness on feet: Secondary | ICD-10-CM | POA: Diagnosis not present

## 2018-02-22 DIAGNOSIS — E785 Hyperlipidemia, unspecified: Secondary | ICD-10-CM | POA: Diagnosis not present

## 2018-02-22 DIAGNOSIS — G4700 Insomnia, unspecified: Secondary | ICD-10-CM | POA: Diagnosis not present

## 2018-02-22 DIAGNOSIS — N3946 Mixed incontinence: Secondary | ICD-10-CM | POA: Diagnosis not present

## 2018-02-22 DIAGNOSIS — M6281 Muscle weakness (generalized): Secondary | ICD-10-CM | POA: Diagnosis not present

## 2018-02-22 DIAGNOSIS — I82502 Chronic embolism and thrombosis of unspecified deep veins of left lower extremity: Secondary | ICD-10-CM | POA: Diagnosis not present

## 2018-02-23 DIAGNOSIS — I82502 Chronic embolism and thrombosis of unspecified deep veins of left lower extremity: Secondary | ICD-10-CM | POA: Diagnosis not present

## 2018-02-23 DIAGNOSIS — R2681 Unsteadiness on feet: Secondary | ICD-10-CM | POA: Diagnosis not present

## 2018-02-23 DIAGNOSIS — M6281 Muscle weakness (generalized): Secondary | ICD-10-CM | POA: Diagnosis not present

## 2018-02-23 DIAGNOSIS — E785 Hyperlipidemia, unspecified: Secondary | ICD-10-CM | POA: Diagnosis not present

## 2018-02-23 DIAGNOSIS — N3946 Mixed incontinence: Secondary | ICD-10-CM | POA: Diagnosis not present

## 2018-02-23 DIAGNOSIS — G4700 Insomnia, unspecified: Secondary | ICD-10-CM | POA: Diagnosis not present

## 2018-02-24 DIAGNOSIS — N3946 Mixed incontinence: Secondary | ICD-10-CM | POA: Diagnosis not present

## 2018-02-24 DIAGNOSIS — M6281 Muscle weakness (generalized): Secondary | ICD-10-CM | POA: Diagnosis not present

## 2018-02-24 DIAGNOSIS — E785 Hyperlipidemia, unspecified: Secondary | ICD-10-CM | POA: Diagnosis not present

## 2018-02-24 DIAGNOSIS — I82502 Chronic embolism and thrombosis of unspecified deep veins of left lower extremity: Secondary | ICD-10-CM | POA: Diagnosis not present

## 2018-02-24 DIAGNOSIS — R2681 Unsteadiness on feet: Secondary | ICD-10-CM | POA: Diagnosis not present

## 2018-02-24 DIAGNOSIS — G4700 Insomnia, unspecified: Secondary | ICD-10-CM | POA: Diagnosis not present

## 2018-02-25 ENCOUNTER — Non-Acute Institutional Stay: Payer: Medicare Other | Admitting: Family

## 2018-02-25 ENCOUNTER — Encounter: Payer: Self-pay | Admitting: Family

## 2018-02-25 ENCOUNTER — Emergency Department (HOSPITAL_COMMUNITY)
Admission: EM | Admit: 2018-02-25 | Discharge: 2018-02-25 | Disposition: A | Discharge status: Home / Self Care | Payer: Medicare Other | Attending: Emergency Medicine | Admitting: Emergency Medicine

## 2018-02-25 ENCOUNTER — Emergency Department (HOSPITAL_COMMUNITY): Payer: Medicare Other

## 2018-02-25 DIAGNOSIS — N390 Urinary tract infection, site not specified: Secondary | ICD-10-CM | POA: Insufficient documentation

## 2018-02-25 DIAGNOSIS — G4700 Insomnia, unspecified: Secondary | ICD-10-CM | POA: Diagnosis not present

## 2018-02-25 DIAGNOSIS — G309 Alzheimer's disease, unspecified: Secondary | ICD-10-CM | POA: Diagnosis not present

## 2018-02-25 DIAGNOSIS — S0031XA Abrasion of nose, initial encounter: Secondary | ICD-10-CM | POA: Insufficient documentation

## 2018-02-25 DIAGNOSIS — Y939 Activity, unspecified: Secondary | ICD-10-CM | POA: Insufficient documentation

## 2018-02-25 DIAGNOSIS — M6281 Muscle weakness (generalized): Secondary | ICD-10-CM | POA: Diagnosis not present

## 2018-02-25 DIAGNOSIS — I129 Hypertensive chronic kidney disease with stage 1 through stage 4 chronic kidney disease, or unspecified chronic kidney disease: Secondary | ICD-10-CM | POA: Diagnosis not present

## 2018-02-25 DIAGNOSIS — Y92122 Bedroom in nursing home as the place of occurrence of the external cause: Secondary | ICD-10-CM | POA: Diagnosis not present

## 2018-02-25 DIAGNOSIS — Y999 Unspecified external cause status: Secondary | ICD-10-CM | POA: Diagnosis not present

## 2018-02-25 DIAGNOSIS — E785 Hyperlipidemia, unspecified: Secondary | ICD-10-CM

## 2018-02-25 DIAGNOSIS — E039 Hypothyroidism, unspecified: Secondary | ICD-10-CM | POA: Diagnosis not present

## 2018-02-25 DIAGNOSIS — F0390 Unspecified dementia without behavioral disturbance: Secondary | ICD-10-CM | POA: Diagnosis not present

## 2018-02-25 DIAGNOSIS — S0990XA Unspecified injury of head, initial encounter: Secondary | ICD-10-CM | POA: Diagnosis not present

## 2018-02-25 DIAGNOSIS — R2681 Unsteadiness on feet: Secondary | ICD-10-CM | POA: Diagnosis not present

## 2018-02-25 DIAGNOSIS — W19XXXA Unspecified fall, initial encounter: Secondary | ICD-10-CM | POA: Diagnosis not present

## 2018-02-25 DIAGNOSIS — Z79899 Other long term (current) drug therapy: Secondary | ICD-10-CM | POA: Diagnosis not present

## 2018-02-25 DIAGNOSIS — F0280 Dementia in other diseases classified elsewhere without behavioral disturbance: Secondary | ICD-10-CM | POA: Diagnosis not present

## 2018-02-25 DIAGNOSIS — M25551 Pain in right hip: Secondary | ICD-10-CM | POA: Diagnosis not present

## 2018-02-25 DIAGNOSIS — R079 Chest pain, unspecified: Secondary | ICD-10-CM | POA: Diagnosis not present

## 2018-02-25 DIAGNOSIS — S79911A Unspecified injury of right hip, initial encounter: Secondary | ICD-10-CM | POA: Diagnosis not present

## 2018-02-25 DIAGNOSIS — N183 Chronic kidney disease, stage 3 unspecified: Secondary | ICD-10-CM

## 2018-02-25 DIAGNOSIS — I10 Essential (primary) hypertension: Secondary | ICD-10-CM | POA: Diagnosis not present

## 2018-02-25 DIAGNOSIS — E1122 Type 2 diabetes mellitus with diabetic chronic kidney disease: Secondary | ICD-10-CM

## 2018-02-25 DIAGNOSIS — W06XXXA Fall from bed, initial encounter: Secondary | ICD-10-CM | POA: Diagnosis not present

## 2018-02-25 DIAGNOSIS — E119 Type 2 diabetes mellitus without complications: Secondary | ICD-10-CM | POA: Diagnosis not present

## 2018-02-25 DIAGNOSIS — E1165 Type 2 diabetes mellitus with hyperglycemia: Secondary | ICD-10-CM | POA: Diagnosis not present

## 2018-02-25 DIAGNOSIS — J3489 Other specified disorders of nose and nasal sinuses: Secondary | ICD-10-CM | POA: Diagnosis not present

## 2018-02-25 DIAGNOSIS — S299XXA Unspecified injury of thorax, initial encounter: Secondary | ICD-10-CM | POA: Diagnosis not present

## 2018-02-25 DIAGNOSIS — R0902 Hypoxemia: Secondary | ICD-10-CM | POA: Diagnosis not present

## 2018-02-25 DIAGNOSIS — I82502 Chronic embolism and thrombosis of unspecified deep veins of left lower extremity: Secondary | ICD-10-CM | POA: Diagnosis not present

## 2018-02-25 DIAGNOSIS — S199XXA Unspecified injury of neck, initial encounter: Secondary | ICD-10-CM | POA: Diagnosis not present

## 2018-02-25 DIAGNOSIS — S0993XA Unspecified injury of face, initial encounter: Secondary | ICD-10-CM | POA: Diagnosis not present

## 2018-02-25 DIAGNOSIS — N3946 Mixed incontinence: Secondary | ICD-10-CM | POA: Diagnosis not present

## 2018-02-25 LAB — CBC WITH DIFFERENTIAL/PLATELET
Abs Immature Granulocytes: 0.03 10*3/uL (ref 0.00–0.07)
Basophils Absolute: 0 10*3/uL (ref 0.0–0.1)
Basophils Relative: 0 %
Eosinophils Absolute: 0.1 10*3/uL (ref 0.0–0.5)
Eosinophils Relative: 1 %
HEMATOCRIT: 38.7 % (ref 36.0–46.0)
Hemoglobin: 12.2 g/dL (ref 12.0–15.0)
Immature Granulocytes: 0 %
Lymphocytes Relative: 25 %
Lymphs Abs: 2.3 10*3/uL (ref 0.7–4.0)
MCH: 29.4 pg (ref 26.0–34.0)
MCHC: 31.5 g/dL (ref 30.0–36.0)
MCV: 93.3 fL (ref 80.0–100.0)
MONO ABS: 0.7 10*3/uL (ref 0.1–1.0)
Monocytes Relative: 7 %
Neutro Abs: 6.1 10*3/uL (ref 1.7–7.7)
Neutrophils Relative %: 67 %
Platelets: 269 10*3/uL (ref 150–400)
RBC: 4.15 MIL/uL (ref 3.87–5.11)
RDW: 14.3 % (ref 11.5–15.5)
WBC: 9.2 10*3/uL (ref 4.0–10.5)
nRBC: 0 % (ref 0.0–0.2)

## 2018-02-25 LAB — URINALYSIS, ROUTINE W REFLEX MICROSCOPIC
Bilirubin Urine: NEGATIVE
Glucose, UA: 50 mg/dL — AB
Hgb urine dipstick: NEGATIVE
Ketones, ur: NEGATIVE mg/dL
Nitrite: NEGATIVE
Protein, ur: 30 mg/dL — AB
Specific Gravity, Urine: 1.01 (ref 1.005–1.030)
pH: 6 (ref 5.0–8.0)

## 2018-02-25 LAB — BASIC METABOLIC PANEL
Anion gap: 9 (ref 5–15)
BUN: 18 mg/dL (ref 8–23)
CO2: 26 mmol/L (ref 22–32)
Calcium: 8.9 mg/dL (ref 8.9–10.3)
Chloride: 102 mmol/L (ref 98–111)
Creatinine, Ser: 1.12 mg/dL — ABNORMAL HIGH (ref 0.44–1.00)
GFR calc Af Amer: 49 mL/min — ABNORMAL LOW (ref >60)
GFR calc non Af Amer: 42 mL/min — ABNORMAL LOW (ref >60)
Glucose, Bld: 181 mg/dL — ABNORMAL HIGH (ref 70–99)
Potassium: 4.3 mmol/L (ref 3.5–5.1)
Sodium: 137 mmol/L (ref 135–145)

## 2018-02-25 MED ORDER — CEPHALEXIN 500 MG PO CAPS: 500.0000 mg | ORAL_CAPSULE | Freq: Four times a day (QID) | ORAL | 0 refills | Status: AC

## 2018-02-25 MED ORDER — CEPHALEXIN 500 MG PO CAPS
500.0000 mg | ORAL_CAPSULE | Freq: Once | ORAL | Status: AC
  Administered 2018-02-25: 500 mg via ORAL
  Filled 2018-02-25: qty 1

## 2018-02-25 NOTE — Discharge Instructions (Addendum)
Your imaging today was reassuring.  No signs of intracranial head injury, fracture, or dislocation. Your urine was positive for UTI.  Take antibiotics as prescribed.  Take the entire course, even if your symptoms improve. Continue taking home medications as prescribed. Use Tylenol or ibuprofen as needed for pain. Follow-up with your primary care doctor for further evaluation of your symptoms next week. Return to the emergency room with any new, worsening, concerning symptoms.

## 2018-02-25 NOTE — ED Notes (Signed)
Bed: WA24 Expected date:  Expected time:  Means of arrival:  Comments: [facility] C cath

## 2018-02-25 NOTE — ED Notes (Signed)
Bed: [facility] Expected date:  Expected time:  Means of arrival:  Comments: EMS-fall 

## 2018-02-25 NOTE — ED Triage Notes (Signed)
Pt BIB [facility] from [facility]. Pt was in her bed and fell off onto the floor. Unwitnessed fall. Hx Alzheimer's. Small laceration to bridge of nose, bleeding controlled. C-collar due to memory issues. Not c/o any pain.

## 2018-02-25 NOTE — ED Notes (Signed)
PT was able to ambulate with a walker and 1 person assist to the restroom.

## 2018-02-25 NOTE — ED Provider Notes (Signed)
[facility] DEPT Provider Note   CSN: [id number] Arrival date & time: 02/25/18  1510    History   Chief Complaint Chief Complaint  Patient presents with  . Fall    HPI Kayla Weaver is a 85 y.o. female presenting for evaluation after a fall.   Level 5 caveat, pt with h/o dementia.   Per facility staff, pt went tot her room after lunch. When staff went to check on her, she was found face down on the floor next to her bed with her head stuck in a trash can. Staff pulled the trash can off and helped the pt sit up. No known LOC. Pt was on the floor for an unknown amount of time. She is not on blood thinners. Dementia at baseline, pt was mentating normally after the fall. At baseline she walks with a walker.   Pt states she thinks she fell but does not remember what happened. Reports pain of her R hip, denies head, neck or back pain.     HPI  Past Medical History:  Diagnosis Date  . Allergic rhinitis   . Asthma   . Carpal tunnel syndrome 1980  . Chronic low back pain   . Constipation 06/27/2014  . GERD (gastroesophageal reflux disease)   . Hearing loss    Hearing aids  . Hyperglycemia 06/27/2014  . Hyperlipidemia 06/27/2014  . Hypertension   . Insomnia 06/27/2014  . Left leg DVT ([place])    chronic since 2008  . Memory loss 06/27/2014  . Meningioma ([place]) 2000   Dr. Ellene Route  . Osteopenia     Patient Active Problem List   Diagnosis Date Noted  . Acute exacerbation of chronic obstructive pulmonary disease (COPD) ([place]) 11/25/2017  . Osteoarthritis of left shoulder 09/14/2017  . Pulmonary emphysema ([place]) 09/07/2017  . DM (diabetes mellitus), type 2 with renal complications ([place]) 40/98/1191  . Hypothyroidism 06/05/2015  . BCC (basal cell carcinoma of skin), nodular 01/09/2015  . Alzheimer disease ([place]) 06/27/2014  . Hyperglycemia 06/27/2014  . Insomnia 06/27/2014  . Hyperlipidemia 06/27/2014  . Constipation 06/27/2014  . Hypertension   .  GERD (gastroesophageal reflux disease)   . Chronic low back pain   . Hearing loss   . Meningioma ([place])   . Left leg DVT ([place])   . Allergic rhinitis     Past Surgical History:  Procedure Laterality Date  . BACK SURGERY  1999   ruptured disk  . BRAIN TUMOR EXCISION  2000   Dr. Ellene Route  . CARPAL TUNNEL RELEASE  1980  . TEAR DUCT PROBING Bilateral 2014   [place]     OB History   No obstetric history on file.      Home Medications    Prior to Admission medications   Medication Sig Start Date End Date Taking? Authorizing Provider  amLODipine (NORVASC) 5 MG tablet Take 5 mg by mouth every morning.   Yes [provider]  atorvastatin (LIPITOR) 10 MG tablet Take 10 mg by mouth every morning.   Yes [provider]  docusate sodium (COLACE) 100 MG capsule Take 100 mg by mouth daily.    Yes [provider]  donepezil (ARICEPT) 5 MG tablet Take 5 mg by mouth at bedtime.   Yes [provider]  fluticasone (FLONASE) 50 MCG/ACT nasal spray Place 2 sprays into both nostrils daily.    Yes [provider]  ipratropium-albuterol (DUONEB) 0.5-2.5 (3) MG/3ML SOLN Take 3 mLs by nebulization 2 (two)  times daily. while awake for wheezing   Yes [provider]  irbesartan (AVAPRO) 150 MG tablet Take 150 mg by mouth daily.   Yes [provider]  levothyroxine (SYNTHROID, LEVOTHROID) 75 MCG tablet Take 75 mcg by mouth daily before breakfast.   Yes [provider]  linagliptin (TRADJENTA) 5 MG TABS tablet Take 5 mg by mouth every morning.    Yes [provider]  memantine (NAMENDA) 5 MG tablet Take 5 mg by mouth 2 (two) times daily.   Yes [provider]  Multiple Vitamins-Minerals (ICAPS AREDS 2) CAPS Take 1 tablet by mouth 2 (two) times daily.   Yes [provider]  omeprazole (PRILOSEC) 20 MG capsule Take 1 capsule (20 mg total) by mouth daily. 07/20/17  Yes Blanchie Serve, MD  cephALEXin (KEFLEX) 500 MG  capsule Take 1 capsule (500 mg total) by mouth 4 (four) times daily for 7 days. 02/25/18 03/04/18  Sircharles Holzheimer, PA-C    Family History Family History  Problem Relation Age of Onset  . CVA Mother   . Cancer Father        prostate     Social History Social History   Tobacco Use  . Smoking status: Never Smoker  . Smokeless tobacco: Never Used  Substance Use Topics  . Alcohol use: No  . Drug use: No     Allergies   Patient has no known allergies.   Review of Systems Review of Systems  Unable to perform ROS: Dementia     Physical Exam Updated Vital Signs BP (!) 155/73   Pulse 86   Temp 97.8 F (36.6 C) (Oral)   Resp (!) 22   SpO2 93%   Physical Exam Vitals signs and nursing note reviewed.  Constitutional:      General: She is not in acute distress.    Appearance: She is well-developed.     Comments: Elderly F in NAD  HENT:     Head: Normocephalic.     Comments: Abrasion and swelling to the bridge of the nose. No active bleeding. No nasal septal hematoma or hemotympanum. No trismus or malocclusion. Eyes:     Extraocular Movements: Extraocular movements intact.     Conjunctiva/sclera: Conjunctivae normal.     Pupils: Pupils are equal, round, and reactive to light.  Neck:     Musculoskeletal: Normal range of motion and neck supple.     Comments: No ttp of midline c-spine Cardiovascular:     Rate and Rhythm: Normal rate and regular rhythm.     Pulses: Normal pulses.  Pulmonary:     Effort: Pulmonary effort is normal. No respiratory distress.     Breath sounds: Normal breath sounds. No wheezing.     Comments: ttp of the anterior chest wall. No contusions, lacerations, or trauma noted. Speaking in full sentences. Clear lung sounds in all fields Chest:     Chest wall: Tenderness present.  Abdominal:     General: There is no distension.     Palpations: Abdomen is soft. There is no mass.     Tenderness: There is no abdominal tenderness. There is no guarding  or rebound.  Musculoskeletal:        General: Tenderness present. No signs of injury.     Comments: TTP of R hip. No pelvic instability. No ttp of upper or lower extremities. No shortening or rotation of the legs. Moving extremities without signs of pain.   Skin:    General: Skin is warm and  dry.     Capillary Refill: Capillary refill takes less than 2 seconds.  Neurological:     Mental Status: She is alert.      ED Treatments / Results  Labs (all labs ordered are listed, but only abnormal results are displayed) Labs Reviewed  BASIC METABOLIC PANEL - Abnormal; Notable for the following components:      Result Value   Glucose, Bld 181 (*)    Creatinine, Ser 1.12 (*)    GFR calc non Af Amer 42 (*)    GFR calc Af Amer 49 (*)    All other components within normal limits  URINALYSIS, ROUTINE W REFLEX MICROSCOPIC - Abnormal; Notable for the following components:   APPearance TURBID (*)    Glucose, UA 50 (*)    Protein, ur 30 (*)    Leukocytes,Ua LARGE (*)    WBC, UA >50 (*)    Bacteria, UA MANY (*)    All other components within normal limits  URINE CULTURE  CBC WITH DIFFERENTIAL/PLATELET    EKG EKG Interpretation  Date/Time:  Thursday February 25 2018 17:40:12 EST Ventricular Rate:  84 PR Interval:    QRS Duration: 87 QT Interval:  383 QTC Calculation: 453 R Axis:   62 Text Interpretation:  Sinus rhythm Confirmed by Virgel Manifold [phone]) on 02/25/2018 6:18:27 PM   Radiology Dg Chest 2 View  Result Date: 02/25/2018 CLINICAL DATA:  Chest pain following a fall. EXAM: CHEST - 2 VIEW COMPARISON:  02/03/2015. FINDINGS: Poor inspiration. No gross change in a normal sized heart. Progressive atheromatous aortic calcifications. Clear lungs. No fracture or pneumothorax seen. Mild thoracic spine degenerative changes. IMPRESSION: No acute abnormality. Electronically Signed   By: Claudie Revering M.D.   On: 02/25/2018 17:06   Ct Head Wo Contrast  Result Date: 02/25/2018 CLINICAL  DATA:  Unwitnessed fall out of bed. EXAM: CT HEAD WITHOUT CONTRAST CT MAXILLOFACIAL WITHOUT CONTRAST CT CERVICAL SPINE WITHOUT CONTRAST TECHNIQUE: Multidetector CT imaging of the head, cervical spine, and maxillofacial structures were performed using the standard protocol without intravenous contrast. Multiplanar CT image reconstructions of the cervical spine and maxillofacial structures were also generated. COMPARISON:  02/03/2015, head CT. FINDINGS: CT HEAD FINDINGS Brain: No evidence of acute infarction, hemorrhage, hydrocephalus, extra-axial collection or mass lesion/mass effect. Marked brain parenchymal volume loss and deep white matter microangiopathy. Postsurgical changes in the left posterior fossa. Vascular: Calcific atherosclerotic disease of the intra cavernous carotid arteries. Skull: Normal. Negative for fracture or focal lesion. Left posterior skull postsurgical defect. Other: None. CT MAXILLOFACIAL FINDINGS Osseous: No fracture or mandibular dislocation. No destructive process. Orbits: Negative. No traumatic or inflammatory finding. Sinuses: Polypoid mucosal thickening of the right maxillary sinus right ethmoid sinus. Soft tissues: Mild soft tissue swelling of the nose. CT CERVICAL SPINE FINDINGS Alignment: Straightening of cervical lordosis. Skull base and vertebrae: No acute fracture. No primary bone lesion or focal pathologic process. Soft tissues and spinal canal: No prevertebral fluid or swelling. No visible canal hematoma. Disc levels: Multilevel osteoarthritic changes, mild. C5-C6 and C6-C7 disc osteophyte complexes causing mild impression of the ventral spinal canal. Upper chest: Negative. Other: none IMPRESSION: 1. No acute intracranial abnormality. 2. Marked brain parenchymal atrophy and chronic microvascular disease. 3. No evidence of acute traumatic injury to cervical spine. 4. Mild soft tissue swelling of the nose. 5. Multilevel osteoarthritic changes of the cervical spine, mild.  Electronically Signed   By: Fidela Salisbury M.D.   On: 02/25/2018 17:34   Ct Cervical Spine Wo  Contrast  Result Date: 02/25/2018 CLINICAL DATA:  Unwitnessed fall out of bed. EXAM: CT HEAD WITHOUT CONTRAST CT MAXILLOFACIAL WITHOUT CONTRAST CT CERVICAL SPINE WITHOUT CONTRAST TECHNIQUE: Multidetector CT imaging of the head, cervical spine, and maxillofacial structures were performed using the standard protocol without intravenous contrast. Multiplanar CT image reconstructions of the cervical spine and maxillofacial structures were also generated. COMPARISON:  02/03/2015, head CT. FINDINGS: CT HEAD FINDINGS Brain: No evidence of acute infarction, hemorrhage, hydrocephalus, extra-axial collection or mass lesion/mass effect. Marked brain parenchymal volume loss and deep white matter microangiopathy. Postsurgical changes in the left posterior fossa. Vascular: Calcific atherosclerotic disease of the intra cavernous carotid arteries. Skull: Normal. Negative for fracture or focal lesion. Left posterior skull postsurgical defect. Other: None. CT MAXILLOFACIAL FINDINGS Osseous: No fracture or mandibular dislocation. No destructive process. Orbits: Negative. No traumatic or inflammatory finding. Sinuses: Polypoid mucosal thickening of the right maxillary sinus right ethmoid sinus. Soft tissues: Mild soft tissue swelling of the nose. CT CERVICAL SPINE FINDINGS Alignment: Straightening of cervical lordosis. Skull base and vertebrae: No acute fracture. No primary bone lesion or focal pathologic process. Soft tissues and spinal canal: No prevertebral fluid or swelling. No visible canal hematoma. Disc levels: Multilevel osteoarthritic changes, mild. C5-C6 and C6-C7 disc osteophyte complexes causing mild impression of the ventral spinal canal. Upper chest: Negative. Other: none IMPRESSION: 1. No acute intracranial abnormality. 2. Marked brain parenchymal atrophy and chronic microvascular disease. 3. No evidence of acute  traumatic injury to cervical spine. 4. Mild soft tissue swelling of the nose. 5. Multilevel osteoarthritic changes of the cervical spine, mild. Electronically Signed   By: Fidela Salisbury M.D.   On: 02/25/2018 17:34   Dg Hip Unilat W Or Wo Pelvis 2-3 Views Right  Result Date: 02/25/2018 CLINICAL DATA:  Fall with right hip pain. EXAM: DG HIP (WITH OR WITHOUT PELVIS) 2-3V RIGHT COMPARISON:  None. FINDINGS: Mild symmetric degenerative change of the hips. No acute fracture or dislocation. Degenerative change of the spine. IMPRESSION: No acute findings. Electronically Signed   By: Marin Olp M.D.   On: 02/25/2018 17:05   Ct Maxillofacial Wo Contrast  Result Date: 02/25/2018 CLINICAL DATA:  Unwitnessed fall out of bed. EXAM: CT HEAD WITHOUT CONTRAST CT MAXILLOFACIAL WITHOUT CONTRAST CT CERVICAL SPINE WITHOUT CONTRAST TECHNIQUE: Multidetector CT imaging of the head, cervical spine, and maxillofacial structures were performed using the standard protocol without intravenous contrast. Multiplanar CT image reconstructions of the cervical spine and maxillofacial structures were also generated. COMPARISON:  02/03/2015, head CT. FINDINGS: CT HEAD FINDINGS Brain: No evidence of acute infarction, hemorrhage, hydrocephalus, extra-axial collection or mass lesion/mass effect. Marked brain parenchymal volume loss and deep white matter microangiopathy. Postsurgical changes in the left posterior fossa. Vascular: Calcific atherosclerotic disease of the intra cavernous carotid arteries. Skull: Normal. Negative for fracture or focal lesion. Left posterior skull postsurgical defect. Other: None. CT MAXILLOFACIAL FINDINGS Osseous: No fracture or mandibular dislocation. No destructive process. Orbits: Negative. No traumatic or inflammatory finding. Sinuses: Polypoid mucosal thickening of the right maxillary sinus right ethmoid sinus. Soft tissues: Mild soft tissue swelling of the nose. CT CERVICAL SPINE FINDINGS Alignment:  Straightening of cervical lordosis. Skull base and vertebrae: No acute fracture. No primary bone lesion or focal pathologic process. Soft tissues and spinal canal: No prevertebral fluid or swelling. No visible canal hematoma. Disc levels: Multilevel osteoarthritic changes, mild. C5-C6 and C6-C7 disc osteophyte complexes causing mild impression of the ventral spinal canal. Upper chest: Negative. Other: none IMPRESSION: 1. No acute intracranial  abnormality. 2. Marked brain parenchymal atrophy and chronic microvascular disease. 3. No evidence of acute traumatic injury to cervical spine. 4. Mild soft tissue swelling of the nose. 5. Multilevel osteoarthritic changes of the cervical spine, mild. Electronically Signed   By: Fidela Salisbury M.D.   On: 02/25/2018 17:34    Procedures Procedures (including critical care time)  Medications Ordered in ED Medications  cephALEXin (KEFLEX) capsule 500 mg (500 mg Oral Given 02/25/18 2024)     Initial Impression / Assessment and Plan / ED Course  I have reviewed the triage vital signs and the nursing notes.  Pertinent labs & imaging results that were available during my care of the patient were reviewed by me and considered in my medical decision making (see chart for details).        Patient presenting for evaluation after fall.  Physical exam shows patient without acute deformity or obvious neurologic deficit.  However, fall was unwitnessed, and patient is unable to say if it was provoked.  As such, will obtain CT head and neck, chest x-ray, x-ray of the pelvis, basic blood work, EKG, and urine.  CT head and neck reassuring, no acute findings.  X-rays viewed interpreted by me, no fracture or dislocation.  EKG without STEMI or changes.  Urine positive for infection with large leuks, many bacteria, and greater than 50 white cells.  As such, will treat for a UTI.  Labs pending.  Labs reassuring, no leukocytosis.  Creatinine at baseline.  Patient ambulated  with walker without difficulty. Case discussed with attending, Dr. Wilson Singer evaluated the pt. At this time, pt appears safe for d/c. Return precautions given.  Final Clinical Impressions(s) / ED Diagnoses   Final diagnoses:  Fall, initial encounter  Urinary tract infection without hematuria, site unspecified  Injury of head, initial encounter    ED Discharge Orders         Ordered    cephALEXin (KEFLEX) 500 MG capsule  4 times daily     02/25/18 [place], Imer Foxworth, PA-C 02/25/18 2035    Virgel Manifold, MD 02/25/18 [phone]

## 2018-02-25 NOTE — Progress Notes (Signed)
Location:  [facility] Room Number:   Place of Service:  ALF [id number]) Provider: Cyntia Staley FNP-C   Mast, Man X, NP  Patient Care Team: Mast, Man X, NP as PCP - General (Internal Medicine) McKenzie, Lilia Argue, MD (Inactive) (Anesthesiology) Kristeen Miss, MD as Consulting Physician (Neurosurgery) Shon Hough, MD as Consulting Physician (Ophthalmology) [name], [facility] (Skilled Nursing and [place]) [name], MD as Consulting Physician (Dermatology)  Extended Emergency Contact Information Primary Emergency Contact: [name] Address: [address]          [name] Phone: [phone] Mobile Phone: [phone] Relation: Son Secondary Emergency Contact: Devona, Holmes Mobile Phone: [phone] Relation: Daughter Preferred language: English Interpreter needed? No  Code Status:  DNR Goals of care: Advanced Directive information Advanced Directives 02/25/2018  Does Patient Have a Medical Advance Directive? No  Type of Advance Directive -  Does patient want to make changes to medical advance directive? No - Patient declined  Copy of [place] in Chart? -  Would patient like information on creating a medical advance directive? No - Patient declined  Pre-existing out of facility DNR order (yellow form or pink MOST form) -     Chief Complaint  Patient presents with  . Medical Management of Chronic Issues    Routine Visit    HPI:  Pt is a 85 y.o. female seen today [place] for medical management of chronic diseases.she is seen in her room today with sitter present at bedside.she denies any acute issues during visit though HPI limited due to her cognitive impairment.sitter and facility staff reports no new concerns.No recent fall episode or weight changes.She continues to ambulate with her front wheel walker but needs reminder to use walker. CBG readings  ranging in the 140's-190's.no signs/symptoms of hyper/hypoglycemia reported by staff.    Past Medical History:  Diagnosis Date  . Allergic rhinitis   . Asthma   . Carpal tunnel syndrome 1980  . Chronic low back pain   . Constipation 06/27/2014  . GERD (gastroesophageal reflux disease)   . Hearing loss    Hearing aids  . Hyperglycemia 06/27/2014  . Hyperlipidemia 06/27/2014  . Hypertension   . Insomnia 06/27/2014  . Left leg DVT ([place])    chronic since 2008  . Memory loss 06/27/2014  . Meningioma ([place]) 2000   Dr. Ellene Route  . Osteopenia    Past Surgical History:  Procedure Laterality Date  . BACK SURGERY  1999   ruptured disk  . BRAIN TUMOR EXCISION  2000   Dr. Ellene Route  . CARPAL TUNNEL RELEASE  1980  . TEAR DUCT PROBING Bilateral 2014   [place]    No Known Allergies  Allergies as of 02/25/2018   No Known Allergies     Medication List       Accurate as of February 25, 2018  6:57 PM. Always use your most recent med list.        amLODipine 5 MG tablet Commonly known as:  NORVASC Take 5 mg by mouth every morning.   atorvastatin 10 MG tablet Commonly known as:  LIPITOR Take 10 mg by mouth every morning.   docusate sodium 100 MG capsule Commonly known as:  COLACE Take 100 mg by mouth daily.   donepezil 5 MG tablet Commonly known as:  ARICEPT Take 5 mg by mouth at bedtime.   fluticasone 50 MCG/ACT nasal spray Commonly known as:  [place]  2 sprays into both nostrils daily.   ICAPS AREDS 2 Caps Take 1 tablet by mouth 2 (two) times daily.   ipratropium-albuterol 0.5-2.5 (3) MG/3ML Soln Commonly known as:  DUONEB Take 3 mLs by nebulization 2 (two) times daily. while awake for wheezing   irbesartan 150 MG tablet Commonly known as:  AVAPRO Take 150 mg by mouth daily.   levothyroxine 75 MCG tablet Commonly known as:  SYNTHROID, LEVOTHROID Take 75 mcg by mouth daily before breakfast.   linagliptin 5 MG Tabs tablet Commonly known as:  TRADJENTA Take 5 mg  by mouth every morning.   memantine 5 MG tablet Commonly known as:  NAMENDA Take 5 mg by mouth 2 (two) times daily.   omeprazole 20 MG capsule Commonly known as:  PRILOSEC Take 1 capsule (20 mg total) by mouth daily.       Review of Systems  Unable to perform ROS: Dementia (additional information provided by Nurse and sitter)  Constitutional: Negative for appetite change, chills, fever and unexpected weight change.  HENT: Positive for hearing loss. Negative for congestion, postnasal drip, rhinorrhea, sinus pressure, sinus pain, sneezing, sore throat and trouble swallowing.        Left ear hearing aid   Eyes: Negative for pain, discharge, redness and itching.  Respiratory: Negative for cough, chest tightness, shortness of breath and wheezing.        Chronic cough on Duoneb.   Cardiovascular: Positive for leg swelling. Negative for chest pain and palpitations.  Gastrointestinal: Negative for abdominal distention, abdominal pain, constipation, diarrhea, nausea and vomiting.  Endocrine: Negative for cold intolerance, heat intolerance, polydipsia, polyphagia and polyuria.  Genitourinary: Negative for dysuria, flank pain and urgency.       Wears adult size pull ups for incontinent   Musculoskeletal: Positive for gait problem.  Skin: Negative for color change, pallor, rash and wound.  Neurological: Negative for dizziness, weakness, light-headedness and headaches.  Hematological: Does not bruise/bleed easily.  Psychiatric/Behavioral: Positive for confusion. Negative for agitation and sleep disturbance. The patient is not nervous/anxious.     Immunization History  Administered Date(s) Administered  . Influenza-Unspecified 10/20/2013, 10/05/2014, 10/18/2015, 10/15/2016, 10/12/2017  . PPD Test 10/10/2013, 03/23/2015, 04/10/2015  . Pneumococcal Polysaccharide-23 02/06/2009  . Td 10/11/2010  . Zoster 01/08/2012   Pertinent  Health Maintenance Due  Topic Date Due  . FOOT EXAM  10/07/1932   . OPHTHALMOLOGY EXAM  02/27/2018  . HEMOGLOBIN A1C  03/25/2018  . INFLUENZA VACCINE  Completed  . DEXA SCAN  Completed  . PNA vac Low Risk Adult  Completed   Fall Risk  09/10/2017 08/27/2016 06/12/2015 06/12/2015 03/13/2015  Falls in the past year? No No No No Yes  Number falls in past yr: - - - - 1  Injury with Fall? - - - - No    Vitals:   02/25/18 0848  BP: (!) 150/78  Pulse: 76  Resp: 18  Temp: 97.6 F (36.4 C)  TempSrc: Oral  SpO2: 94%  Weight: 160 lb 12.8 oz (72.9 kg)  Height: 5\' 5"  (1.651 m)   Body mass index is 26.76 kg/m. Physical Exam Vitals signs reviewed.  Constitutional:      General: She is not in acute distress.    Appearance: She is overweight.  HENT:     Head: Normocephalic.     Ears:     Comments: Left hearing aid in place     Nose: Nose normal. No congestion or rhinorrhea.     Mouth/Throat:  Mouth: Mucous membranes are moist.     Pharynx: Oropharynx is clear. No oropharyngeal exudate or posterior oropharyngeal erythema.  Eyes:     General: No scleral icterus.       Right eye: No discharge.        Left eye: No discharge.     Conjunctiva/sclera: Conjunctivae normal.     Pupils: Pupils are equal, round, and reactive to light.  Neck:     Musculoskeletal: Normal range of motion. No neck rigidity or muscular tenderness.  Cardiovascular:     Rate and Rhythm: Normal rate and regular rhythm.     Pulses: Normal pulses.     Heart sounds: Normal heart sounds. No murmur. No friction rub. No gallop.   Pulmonary:     Effort: Pulmonary effort is normal. No respiratory distress.     Breath sounds: Normal breath sounds. No wheezing, rhonchi or rales.  Chest:     Chest wall: No tenderness.  Abdominal:     General: Bowel sounds are normal. There is no distension.     Palpations: Abdomen is soft. There is no mass.     Tenderness: There is no abdominal tenderness. There is no right CVA tenderness, left CVA tenderness, guarding or rebound.  Musculoskeletal:         General: No tenderness.     Comments: Moves x 4 extremities.unsteady gait ambulates with a front wheel walker.bilateral lower extremities trace edema knee high compression hose in place.   Lymphadenopathy:     Cervical: No cervical adenopathy.  Skin:    General: Skin is warm and dry.     Coloration: Skin is not pale.     Findings: No erythema, lesion or rash.     Comments: Skin intact   Neurological:     Mental Status: She is alert. Mental status is at baseline.     Cranial Nerves: No cranial nerve deficit.     Motor: No weakness.     Gait: Gait abnormal.  Psychiatric:        Mood and Affect: Mood normal.        Speech: Speech normal.        Behavior: Behavior normal.        Thought Content: Thought content normal.        Cognition and Memory: Memory is impaired.        Judgment: Judgment normal.     Labs reviewed: Recent Labs    03/05/17 03/30/17 06/18/17 09/17/17 09/24/17  NA 139  139 138 141  141 138 139  K 4.2  4.2 4.3 4.5  4.5 4.4  --   CL 103 103 102  --   --   CO2 27 29 29   --   --   BUN 29* 27* 27* 22* 19  CREATININE 1.3*  1.32 1.2* 1.3*  1.26 1.34 1.2*  CALCIUM 9.4  9.4 9.4 9.2  9.2 9.0  --    Recent Labs    03/05/17 06/18/17 09/24/17  AST 13  13 13 14   ALT 12  12 16 15   ALKPHOS 60  60 64 66  BILITOT 0.4 0.4  --   PROT 6.1  6.1 6.4  6.4  --   ALBUMIN 3.8  3.8 3.9  3.9  --    Recent Labs    06/18/17 09/24/17  WBC 8.0  8 9.2  NEUTROABS 4,016 4,618  HGB 11.8*  11.8 12.3  HCT 35*  34.7 37  MCV 92.3  --  PLT 261 273   Lab Results  Component Value Date   TSH 4.31 09/24/2017   Lab Results  Component Value Date   HGBA1C 7.4 09/24/2017   Lab Results  Component Value Date   CHOL 172 06/18/2017   CHOL 172 06/18/2017   HDL 41 06/18/2017   LDLCALC 100 06/18/2017   LDLCALC 100 06/18/2017   TRIG 193 06/18/2017   TRIG 193 (A) 06/18/2017    Significant Diagnostic Results in last 30 days:  Dg Chest 2 View  Result Date:  02/25/2018 CLINICAL DATA:  Chest pain following a fall. EXAM: CHEST - 2 VIEW COMPARISON:  02/03/2015. FINDINGS: Poor inspiration. No gross change in a normal sized heart. Progressive atheromatous aortic calcifications. Clear lungs. No fracture or pneumothorax seen. Mild thoracic spine degenerative changes. IMPRESSION: No acute abnormality. Electronically Signed   By: Claudie Revering M.D.   On: 02/25/2018 17:06   Ct Head Wo Contrast  Result Date: 02/25/2018 CLINICAL DATA:  Unwitnessed fall out of bed. EXAM: CT HEAD WITHOUT CONTRAST CT MAXILLOFACIAL WITHOUT CONTRAST CT CERVICAL SPINE WITHOUT CONTRAST TECHNIQUE: Multidetector CT imaging of the head, cervical spine, and maxillofacial structures were performed using the standard protocol without intravenous contrast. Multiplanar CT image reconstructions of the cervical spine and maxillofacial structures were also generated. COMPARISON:  02/03/2015, head CT. FINDINGS: CT HEAD FINDINGS Brain: No evidence of acute infarction, hemorrhage, hydrocephalus, extra-axial collection or mass lesion/mass effect. Marked brain parenchymal volume loss and deep white matter microangiopathy. Postsurgical changes in the left posterior fossa. Vascular: Calcific atherosclerotic disease of the intra cavernous carotid arteries. Skull: Normal. Negative for fracture or focal lesion. Left posterior skull postsurgical defect. Other: None. CT MAXILLOFACIAL FINDINGS Osseous: No fracture or mandibular dislocation. No destructive process. Orbits: Negative. No traumatic or inflammatory finding. Sinuses: Polypoid mucosal thickening of the right maxillary sinus right ethmoid sinus. Soft tissues: Mild soft tissue swelling of the nose. CT CERVICAL SPINE FINDINGS Alignment: Straightening of cervical lordosis. Skull base and vertebrae: No acute fracture. No primary bone lesion or focal pathologic process. Soft tissues and spinal canal: No prevertebral fluid or swelling. No visible canal hematoma. Disc  levels: Multilevel osteoarthritic changes, mild. C5-C6 and C6-C7 disc osteophyte complexes causing mild impression of the ventral spinal canal. Upper chest: Negative. Other: none IMPRESSION: 1. No acute intracranial abnormality. 2. Marked brain parenchymal atrophy and chronic microvascular disease. 3. No evidence of acute traumatic injury to cervical spine. 4. Mild soft tissue swelling of the nose. 5. Multilevel osteoarthritic changes of the cervical spine, mild. Electronically Signed   By: Fidela Salisbury M.D.   On: 02/25/2018 17:34   Ct Cervical Spine Wo Contrast  Result Date: 02/25/2018 CLINICAL DATA:  Unwitnessed fall out of bed. EXAM: CT HEAD WITHOUT CONTRAST CT MAXILLOFACIAL WITHOUT CONTRAST CT CERVICAL SPINE WITHOUT CONTRAST TECHNIQUE: Multidetector CT imaging of the head, cervical spine, and maxillofacial structures were performed using the standard protocol without intravenous contrast. Multiplanar CT image reconstructions of the cervical spine and maxillofacial structures were also generated. COMPARISON:  02/03/2015, head CT. FINDINGS: CT HEAD FINDINGS Brain: No evidence of acute infarction, hemorrhage, hydrocephalus, extra-axial collection or mass lesion/mass effect. Marked brain parenchymal volume loss and deep white matter microangiopathy. Postsurgical changes in the left posterior fossa. Vascular: Calcific atherosclerotic disease of the intra cavernous carotid arteries. Skull: Normal. Negative for fracture or focal lesion. Left posterior skull postsurgical defect. Other: None. CT MAXILLOFACIAL FINDINGS Osseous: No fracture or mandibular dislocation. No destructive process. Orbits: Negative. No traumatic or inflammatory finding. Sinuses: Polypoid  mucosal thickening of the right maxillary sinus right ethmoid sinus. Soft tissues: Mild soft tissue swelling of the nose. CT CERVICAL SPINE FINDINGS Alignment: Straightening of cervical lordosis. Skull base and vertebrae: No acute fracture. No primary  bone lesion or focal pathologic process. Soft tissues and spinal canal: No prevertebral fluid or swelling. No visible canal hematoma. Disc levels: Multilevel osteoarthritic changes, mild. C5-C6 and C6-C7 disc osteophyte complexes causing mild impression of the ventral spinal canal. Upper chest: Negative. Other: none IMPRESSION: 1. No acute intracranial abnormality. 2. Marked brain parenchymal atrophy and chronic microvascular disease. 3. No evidence of acute traumatic injury to cervical spine. 4. Mild soft tissue swelling of the nose. 5. Multilevel osteoarthritic changes of the cervical spine, mild. Electronically Signed   By: Fidela Salisbury M.D.   On: 02/25/2018 17:34   Dg Hip Unilat W Or Wo Pelvis 2-3 Views Right  Result Date: 02/25/2018 CLINICAL DATA:  Fall with right hip pain. EXAM: DG HIP (WITH OR WITHOUT PELVIS) 2-3V RIGHT COMPARISON:  None. FINDINGS: Mild symmetric degenerative change of the hips. No acute fracture or dislocation. Degenerative change of the spine. IMPRESSION: No acute findings. Electronically Signed   By: Marin Olp M.D.   On: 02/25/2018 17:05   Ct Maxillofacial Wo Contrast  Result Date: 02/25/2018 CLINICAL DATA:  Unwitnessed fall out of bed. EXAM: CT HEAD WITHOUT CONTRAST CT MAXILLOFACIAL WITHOUT CONTRAST CT CERVICAL SPINE WITHOUT CONTRAST TECHNIQUE: Multidetector CT imaging of the head, cervical spine, and maxillofacial structures were performed using the standard protocol without intravenous contrast. Multiplanar CT image reconstructions of the cervical spine and maxillofacial structures were also generated. COMPARISON:  02/03/2015, head CT. FINDINGS: CT HEAD FINDINGS Brain: No evidence of acute infarction, hemorrhage, hydrocephalus, extra-axial collection or mass lesion/mass effect. Marked brain parenchymal volume loss and deep white matter microangiopathy. Postsurgical changes in the left posterior fossa. Vascular: Calcific atherosclerotic disease of the intra cavernous  carotid arteries. Skull: Normal. Negative for fracture or focal lesion. Left posterior skull postsurgical defect. Other: None. CT MAXILLOFACIAL FINDINGS Osseous: No fracture or mandibular dislocation. No destructive process. Orbits: Negative. No traumatic or inflammatory finding. Sinuses: Polypoid mucosal thickening of the right maxillary sinus right ethmoid sinus. Soft tissues: Mild soft tissue swelling of the nose. CT CERVICAL SPINE FINDINGS Alignment: Straightening of cervical lordosis. Skull base and vertebrae: No acute fracture. No primary bone lesion or focal pathologic process. Soft tissues and spinal canal: No prevertebral fluid or swelling. No visible canal hematoma. Disc levels: Multilevel osteoarthritic changes, mild. C5-C6 and C6-C7 disc osteophyte complexes causing mild impression of the ventral spinal canal. Upper chest: Negative. Other: none IMPRESSION: 1. No acute intracranial abnormality. 2. Marked brain parenchymal atrophy and chronic microvascular disease. 3. No evidence of acute traumatic injury to cervical spine. 4. Mild soft tissue swelling of the nose. 5. Multilevel osteoarthritic changes of the cervical spine, mild. Electronically Signed   By: Fidela Salisbury M.D.   On: 02/25/2018 17:34    Assessment/Plan  1. Type 2 diabetes mellitus with stage 3 chronic kidney disease, without long-term current use of insulin (HCC) Lab Results  Component Value Date   HGBA1C 7.4 09/24/2017  CBG reviewed readings ranging in the 140's-190's.continue on tradjenta 5 mg tablet daily.On Statin but off ASA due to high risk for falls.continue to follow up with podiatry for annual foot exam and ophthalmology for eye exam.  Check Hgb A1C 03/01/2018.  2. Benign hypertension with CKD (chronic kidney disease) stage III ([place]) B/p reviewed not at goal for Type 2  DM SBP readings in the 140's-150's.will continue on Avapro 150 mg tablet daily and amlodipine 5 mg tablet daily due to high risk for falls.continue  on statin.  - check CBC/diff,CMP 03/01/2018    3. Acquired hypothyroidism - Continue on levothyroxine 75 mcg tablet daily on empty stomach. - TSH level 03/01/2018  4. Hyperlipidemia LDL goal <70 Lab Results  Component Value Date   CHOL 172 06/18/2017   CHOL 172 06/18/2017   HDL 41 06/18/2017   LDLCALC 100 06/18/2017   LDLCALC 100 06/18/2017   TRIG 193 06/18/2017   TRIG 193 (A) 06/18/2017   Latest LDL not at goal.continue on atorvastatin 10 mg tablet daily. - obtain lipid panel 03/01/2018    5. Alzheimer's dementia without behavioral disturbance, unspecified timing of dementia onset ([place]) No new behavioral issues reported.Progressive decline expected.continue with supportive care.Has private sitter during the day.continue on donepezil 5 mg tablet daily and memantine 5 mg tablet twice daily.   Family/ staff Communication: Reviewed plan of care with patient and facility Nurse supervisor   Labs/tests ordered: CBC/diff,CMP, Hgb A1C, TSH level and lipid panel 03/01/2018.  Sandrea Hughs, NP

## 2018-02-26 ENCOUNTER — Encounter: Payer: Self-pay | Admitting: Internal Medicine

## 2018-02-26 ENCOUNTER — Non-Acute Institutional Stay: Payer: Medicare Other | Admitting: Internal Medicine

## 2018-02-26 DIAGNOSIS — G309 Alzheimer's disease, unspecified: Secondary | ICD-10-CM | POA: Diagnosis not present

## 2018-02-26 DIAGNOSIS — W19XXXD Unspecified fall, subsequent encounter: Secondary | ICD-10-CM

## 2018-02-26 DIAGNOSIS — F0280 Dementia in other diseases classified elsewhere without behavioral disturbance: Secondary | ICD-10-CM | POA: Diagnosis not present

## 2018-02-26 DIAGNOSIS — N3000 Acute cystitis without hematuria: Secondary | ICD-10-CM | POA: Diagnosis not present

## 2018-02-26 NOTE — Progress Notes (Signed)
Location:  [place] Room Number: 17 Place of Service:  ALF [phone]) Provider:    Mast, Man X, NP  Patient Care Team: Mast, Man X, NP as PCP - General (Internal Medicine) McKenzie, Lilia Argue, MD (Inactive) (Anesthesiology) Kristeen Miss, MD as Consulting Physician (Neurosurgery) Shon Hough, MD as Consulting Physician (Ophthalmology) [name], [facility] (Skilled Nursing and [place]) [name], MD as Consulting Physician (Dermatology)  Extended Emergency Contact Information Primary Emergency Contact: [name] Address: [place]          [name] Phone: [phone] Mobile Phone: [phone] Relation: Son Secondary Emergency Contact: Dwanda, [place] Mobile Phone: [phone] Relation: Daughter Preferred language: English Interpreter needed? No  Code Status:  DNR Goals of care: Advanced Directive information Advanced Directives 02/26/2018  Does Patient Have a Medical Advance Directive? Yes  Type of Advance Directive Out of facility DNR (pink MOST or yellow form);Healthcare Power of Attorney  Does patient want to make changes to medical advance directive? No - Patient declined  Copy of [place] in Chart? Yes - validated most recent copy scanned in chart (See row information)  Would patient like information on creating a medical advance directive? -  Pre-existing out of facility DNR order (yellow form or pink MOST form) Yellow form placed in chart (order not valid for inpatient use)     Chief Complaint  Patient presents with  . Acute Visit    UTI,ED follow up, fall    HPI:  Pt is a 85 y.o. female seen today for an acute visit for Follow up from ED. Patient has h/o Hypertension, Diabetes mellitus, Hyperlipidemia,Hypothyroid, And Cognitive impairment Patient was unable to give me any history. But per Nurses she fell and her head got hit on the Trash Can.She  was send to the ED. It is hard to get much history from patient but it seems she was trying to get out the Bed When she lost her balance and hit the trash can. In th ED her CT Scan of Head and Neck was Negative for any acute injury. Her Urine was positive for Leucocytes and she was discharged back to facility on Keflex. She is back to her baseline denies any Headache. Walking with the walker.    Past Medical History:  Diagnosis Date  . Allergic rhinitis   . Asthma   . Carpal tunnel syndrome 1980  . Chronic low back pain   . Constipation 06/27/2014  . GERD (gastroesophageal reflux disease)   . Hearing loss    Hearing aids  . Hyperglycemia 06/27/2014  . Hyperlipidemia 06/27/2014  . Hypertension   . Insomnia 06/27/2014  . Left leg DVT ([place])    chronic since 2008  . Memory loss 06/27/2014  . Meningioma ([place]) 2000   Dr. Ellene Route  . Osteopenia    Past Surgical History:  Procedure Laterality Date  . BACK SURGERY  1999   ruptured disk  . BRAIN TUMOR EXCISION  2000   Dr. Ellene Route  . CARPAL TUNNEL RELEASE  1980  . TEAR DUCT PROBING Bilateral 2014   [place]on    No Known Allergies  Outpatient Encounter Medications as of 02/26/2018  Medication Sig  . amLODipine (NORVASC) 5 MG tablet Take 5 mg by mouth every morning.  Marland Kitchen atorvastatin (LIPITOR) 10 MG tablet Take 10 mg by mouth every morning.  . cephALEXin (KEFLEX) 500 MG capsule Take 1 capsule (500 mg total) by mouth 4 (four) times  daily for 7 days.  Marland Kitchen docusate sodium (COLACE) 100 MG capsule Take 100 mg by mouth daily.   Marland Kitchen donepezil (ARICEPT) 5 MG tablet Take 5 mg by mouth at bedtime.  . fluticasone (FLONASE) 50 MCG/ACT nasal spray Place 2 sprays into both nostrils daily.   Marland Kitchen ipratropium-albuterol (DUONEB) 0.5-2.5 (3) MG/3ML SOLN Take 3 mLs by nebulization 2 (two) times daily. while awake for wheezing  . irbesartan (AVAPRO) 150 MG tablet Take 150 mg by mouth daily.  Marland Kitchen levothyroxine (SYNTHROID, LEVOTHROID) 75 MCG tablet Take 75 mcg by mouth  daily before breakfast.  . linagliptin (TRADJENTA) 5 MG TABS tablet Take 5 mg by mouth every morning.   . memantine (NAMENDA) 5 MG tablet Take 5 mg by mouth 2 (two) times daily.  . Multiple Vitamins-Minerals (ICAPS AREDS 2) CAPS Take 1 tablet by mouth 2 (two) times daily.  Marland Kitchen omeprazole (PRILOSEC) 20 MG capsule Take 1 capsule (20 mg total) by mouth daily.  Marland Kitchen saccharomyces boulardii (FLORASTOR) 250 MG capsule Take 250 mg by mouth 2 (two) times daily.   No facility-administered encounter medications on file as of 02/26/2018.     Review of Systems  Unable to perform ROS: Dementia    Immunization History  Administered Date(s) Administered  . Influenza-Unspecified 10/20/2013, 10/05/2014, 10/18/2015, 10/15/2016, 10/12/2017  . PPD Test 10/10/2013, 03/23/2015, 04/10/2015  . Pneumococcal Polysaccharide-23 02/06/2009  . Td 10/11/2010  . Zoster 01/08/2012   Pertinent  Health Maintenance Due  Topic Date Due  . FOOT EXAM  10/07/1932  . OPHTHALMOLOGY EXAM  02/27/2018  . HEMOGLOBIN A1C  03/25/2018  . INFLUENZA VACCINE  Completed  . DEXA SCAN  Completed  . PNA vac Low Risk Adult  Completed   Fall Risk  09/10/2017 08/27/2016 06/12/2015 06/12/2015 03/13/2015  Falls in the past year? No No No No Yes  Number falls in past yr: - - - - 1  Injury with Fall? - - - - No   Functional Status Survey:    Vitals:   02/26/18 1413  BP: (!) 200/0  Pulse: (!) 104  Resp: 18  Temp: 97.6 F (36.4 C)  SpO2: 94%  Weight: 160 lb 12.8 oz (72.9 kg)  Height: 5\' 5"  (1.651 m)   Body mass index is 26.76 kg/m. Physical Exam Vitals signs reviewed.  Constitutional:      Appearance: Normal appearance.  HENT:     Head: Normocephalic.     Nose: Nose normal.     Mouth/Throat:     Mouth: Mucous membranes are moist.     Pharynx: Oropharynx is clear.  Eyes:     Pupils: Pupils are equal, round, and reactive to light.  Neck:     Musculoskeletal: Neck supple.  Cardiovascular:     Rate and Rhythm: Normal rate and  regular rhythm.     Pulses: Normal pulses.     Heart sounds: Normal heart sounds.  Pulmonary:     Effort: Pulmonary effort is normal.     Breath sounds: Normal breath sounds.  Abdominal:     General: Abdomen is flat. Bowel sounds are normal.     Palpations: Abdomen is soft.  Musculoskeletal:     Comments: Trace edema Bilateral  Skin:    General: Skin is warm and dry.  Neurological:     General: No focal deficit present.     Mental Status: She is alert.     Comments: Pleasantly Demented  Psychiatric:        Mood and Affect: Mood normal.  Thought Content: Thought content normal.      Labs reviewed: Recent Labs    03/30/17 06/18/17 09/17/17 09/24/17 02/25/18 1753  NA 138 141  141 138 139 137  K 4.3 4.5  4.5 4.4  --  4.3  CL 103 102  --   --  102  CO2 29 29  --   --  26  GLUCOSE  --   --   --   --  181*  BUN 27* 27* 22* 19 18  CREATININE 1.2* 1.3*  1.26 1.34 1.2* 1.12*  CALCIUM 9.4 9.2  9.2 9.0  --  8.9   Recent Labs    03/05/17 06/18/17 09/24/17  AST 13  13 13 14   ALT 12  12 16 15   ALKPHOS 60  60 64 66  BILITOT 0.4 0.4  --   PROT 6.1  6.1 6.4  6.4  --   ALBUMIN 3.8  3.8 3.9  3.9  --    Recent Labs    06/18/17 09/24/17 02/25/18 1753  WBC 8.0  8 9.2 9.2  NEUTROABS 4,016 4,618 6.1  HGB 11.8*  11.8 12.3 12.2  HCT 35*  34.7 37 38.7  MCV 92.3  --  93.3  PLT 261 273 269   Lab Results  Component Value Date   TSH 4.31 09/24/2017   Lab Results  Component Value Date   HGBA1C 7.4 09/24/2017   Lab Results  Component Value Date   CHOL 172 06/18/2017   CHOL 172 06/18/2017   HDL 41 06/18/2017   LDLCALC 100 06/18/2017   LDLCALC 100 06/18/2017   TRIG 193 06/18/2017   TRIG 193 (A) 06/18/2017    Significant Diagnostic Results in last 30 days:  Dg Chest 2 View  Result Date: 02/25/2018 CLINICAL DATA:  Chest pain following a fall. EXAM: CHEST - 2 VIEW COMPARISON:  02/03/2015. FINDINGS: Poor inspiration. No gross change in a normal sized heart.  Progressive atheromatous aortic calcifications. Clear lungs. No fracture or pneumothorax seen. Mild thoracic spine degenerative changes. IMPRESSION: No acute abnormality. Electronically Signed   By: Claudie Revering M.D.   On: 02/25/2018 17:06   Ct Head Wo Contrast  Result Date: 02/25/2018 CLINICAL DATA:  Unwitnessed fall out of bed. EXAM: CT HEAD WITHOUT CONTRAST CT MAXILLOFACIAL WITHOUT CONTRAST CT CERVICAL SPINE WITHOUT CONTRAST TECHNIQUE: Multidetector CT imaging of the head, cervical spine, and maxillofacial structures were performed using the standard protocol without intravenous contrast. Multiplanar CT image reconstructions of the cervical spine and maxillofacial structures were also generated. COMPARISON:  02/03/2015, head CT. FINDINGS: CT HEAD FINDINGS Brain: No evidence of acute infarction, hemorrhage, hydrocephalus, extra-axial collection or mass lesion/mass effect. Marked brain parenchymal volume loss and deep white matter microangiopathy. Postsurgical changes in the left posterior fossa. Vascular: Calcific atherosclerotic disease of the intra cavernous carotid arteries. Skull: Normal. Negative for fracture or focal lesion. Left posterior skull postsurgical defect. Other: None. CT MAXILLOFACIAL FINDINGS Osseous: No fracture or mandibular dislocation. No destructive process. Orbits: Negative. No traumatic or inflammatory finding. Sinuses: Polypoid mucosal thickening of the right maxillary sinus right ethmoid sinus. Soft tissues: Mild soft tissue swelling of the nose. CT CERVICAL SPINE FINDINGS Alignment: Straightening of cervical lordosis. Skull base and vertebrae: No acute fracture. No primary bone lesion or focal pathologic process. Soft tissues and spinal canal: No prevertebral fluid or swelling. No visible canal hematoma. Disc levels: Multilevel osteoarthritic changes, mild. C5-C6 and C6-C7 disc osteophyte complexes causing mild impression of the ventral spinal canal. Upper chest: Negative.  Other:  none IMPRESSION: 1. No acute intracranial abnormality. 2. Marked brain parenchymal atrophy and chronic microvascular disease. 3. No evidence of acute traumatic injury to cervical spine. 4. Mild soft tissue swelling of the nose. 5. Multilevel osteoarthritic changes of the cervical spine, mild. Electronically Signed   By: Fidela Salisbury M.D.   On: 02/25/2018 17:34   Ct Cervical Spine Wo Contrast  Result Date: 02/25/2018 CLINICAL DATA:  Unwitnessed fall out of bed. EXAM: CT HEAD WITHOUT CONTRAST CT MAXILLOFACIAL WITHOUT CONTRAST CT CERVICAL SPINE WITHOUT CONTRAST TECHNIQUE: Multidetector CT imaging of the head, cervical spine, and maxillofacial structures were performed using the standard protocol without intravenous contrast. Multiplanar CT image reconstructions of the cervical spine and maxillofacial structures were also generated. COMPARISON:  02/03/2015, head CT. FINDINGS: CT HEAD FINDINGS Brain: No evidence of acute infarction, hemorrhage, hydrocephalus, extra-axial collection or mass lesion/mass effect. Marked brain parenchymal volume loss and deep white matter microangiopathy. Postsurgical changes in the left posterior fossa. Vascular: Calcific atherosclerotic disease of the intra cavernous carotid arteries. Skull: Normal. Negative for fracture or focal lesion. Left posterior skull postsurgical defect. Other: None. CT MAXILLOFACIAL FINDINGS Osseous: No fracture or mandibular dislocation. No destructive process. Orbits: Negative. No traumatic or inflammatory finding. Sinuses: Polypoid mucosal thickening of the right maxillary sinus right ethmoid sinus. Soft tissues: Mild soft tissue swelling of the nose. CT CERVICAL SPINE FINDINGS Alignment: Straightening of cervical lordosis. Skull base and vertebrae: No acute fracture. No primary bone lesion or focal pathologic process. Soft tissues and spinal canal: No prevertebral fluid or swelling. No visible canal hematoma. Disc levels: Multilevel osteoarthritic  changes, mild. C5-C6 and C6-C7 disc osteophyte complexes causing mild impression of the ventral spinal canal. Upper chest: Negative. Other: none IMPRESSION: 1. No acute intracranial abnormality. 2. Marked brain parenchymal atrophy and chronic microvascular disease. 3. No evidence of acute traumatic injury to cervical spine. 4. Mild soft tissue swelling of the nose. 5. Multilevel osteoarthritic changes of the cervical spine, mild. Electronically Signed   By: Fidela Salisbury M.D.   On: 02/25/2018 17:34   Dg Hip Unilat W Or Wo Pelvis 2-3 Views Right  Result Date: 02/25/2018 CLINICAL DATA:  Fall with right hip pain. EXAM: DG HIP (WITH OR WITHOUT PELVIS) 2-3V RIGHT COMPARISON:  None. FINDINGS: Mild symmetric degenerative change of the hips. No acute fracture or dislocation. Degenerative change of the spine. IMPRESSION: No acute findings. Electronically Signed   By: Marin Olp M.D.   On: 02/25/2018 17:05   Ct Maxillofacial Wo Contrast  Result Date: 02/25/2018 CLINICAL DATA:  Unwitnessed fall out of bed. EXAM: CT HEAD WITHOUT CONTRAST CT MAXILLOFACIAL WITHOUT CONTRAST CT CERVICAL SPINE WITHOUT CONTRAST TECHNIQUE: Multidetector CT imaging of the head, cervical spine, and maxillofacial structures were performed using the standard protocol without intravenous contrast. Multiplanar CT image reconstructions of the cervical spine and maxillofacial structures were also generated. COMPARISON:  02/03/2015, head CT. FINDINGS: CT HEAD FINDINGS Brain: No evidence of acute infarction, hemorrhage, hydrocephalus, extra-axial collection or mass lesion/mass effect. Marked brain parenchymal volume loss and deep white matter microangiopathy. Postsurgical changes in the left posterior fossa. Vascular: Calcific atherosclerotic disease of the intra cavernous carotid arteries. Skull: Normal. Negative for fracture or focal lesion. Left posterior skull postsurgical defect. Other: None. CT MAXILLOFACIAL FINDINGS Osseous: No fracture  or mandibular dislocation. No destructive process. Orbits: Negative. No traumatic or inflammatory finding. Sinuses: Polypoid mucosal thickening of the right maxillary sinus right ethmoid sinus. Soft tissues: Mild soft tissue swelling of the nose. CT CERVICAL SPINE  FINDINGS Alignment: Straightening of cervical lordosis. Skull base and vertebrae: No acute fracture. No primary bone lesion or focal pathologic process. Soft tissues and spinal canal: No prevertebral fluid or swelling. No visible canal hematoma. Disc levels: Multilevel osteoarthritic changes, mild. C5-C6 and C6-C7 disc osteophyte complexes causing mild impression of the ventral spinal canal. Upper chest: Negative. Other: none IMPRESSION: 1. No acute intracranial abnormality. 2. Marked brain parenchymal atrophy and chronic microvascular disease. 3. No evidence of acute traumatic injury to cervical spine. 4. Mild soft tissue swelling of the nose. 5. Multilevel osteoarthritic changes of the cervical spine, mild. Electronically Signed   By: Fidela Salisbury M.D.   On: 02/25/2018 17:34    Assessment/Plan  S/P Fall Patient back to her Baseline. No Imaging in the ED were Negative Continue to monitor. UTI On Keflex Urine Cultures Pending   Family/ staff Communication:   Labs/tests ordered:

## 2018-02-27 LAB — URINE CULTURE: Culture: NO GROWTH

## 2018-02-28 ENCOUNTER — Encounter: Payer: Self-pay | Admitting: Internal Medicine

## 2018-02-28 DIAGNOSIS — M6281 Muscle weakness (generalized): Secondary | ICD-10-CM | POA: Diagnosis not present

## 2018-02-28 DIAGNOSIS — I82502 Chronic embolism and thrombosis of unspecified deep veins of left lower extremity: Secondary | ICD-10-CM | POA: Diagnosis not present

## 2018-02-28 DIAGNOSIS — R2681 Unsteadiness on feet: Secondary | ICD-10-CM | POA: Diagnosis not present

## 2018-02-28 DIAGNOSIS — G4700 Insomnia, unspecified: Secondary | ICD-10-CM | POA: Diagnosis not present

## 2018-02-28 DIAGNOSIS — N3946 Mixed incontinence: Secondary | ICD-10-CM | POA: Diagnosis not present

## 2018-02-28 DIAGNOSIS — E785 Hyperlipidemia, unspecified: Secondary | ICD-10-CM | POA: Diagnosis not present

## 2018-02-28 NOTE — Addendum Note (Signed)
Addended by: Georgina Snell on: 02/28/2018 06:59 PM   Modules accepted: Level of Service

## 2018-03-01 DIAGNOSIS — E785 Hyperlipidemia, unspecified: Secondary | ICD-10-CM | POA: Diagnosis not present

## 2018-03-01 DIAGNOSIS — M79602 Pain in left arm: Secondary | ICD-10-CM | POA: Diagnosis not present

## 2018-03-01 DIAGNOSIS — M79632 Pain in left forearm: Secondary | ICD-10-CM | POA: Diagnosis not present

## 2018-03-01 DIAGNOSIS — I82502 Chronic embolism and thrombosis of unspecified deep veins of left lower extremity: Secondary | ICD-10-CM | POA: Diagnosis not present

## 2018-03-01 DIAGNOSIS — G4700 Insomnia, unspecified: Secondary | ICD-10-CM | POA: Diagnosis not present

## 2018-03-01 DIAGNOSIS — R2681 Unsteadiness on feet: Secondary | ICD-10-CM | POA: Diagnosis not present

## 2018-03-01 DIAGNOSIS — M6281 Muscle weakness (generalized): Secondary | ICD-10-CM | POA: Diagnosis not present

## 2018-03-01 DIAGNOSIS — M25512 Pain in left shoulder: Secondary | ICD-10-CM | POA: Diagnosis not present

## 2018-03-01 DIAGNOSIS — E039 Hypothyroidism, unspecified: Secondary | ICD-10-CM | POA: Diagnosis not present

## 2018-03-01 DIAGNOSIS — N3946 Mixed incontinence: Secondary | ICD-10-CM | POA: Diagnosis not present

## 2018-03-01 DIAGNOSIS — E119 Type 2 diabetes mellitus without complications: Secondary | ICD-10-CM | POA: Diagnosis not present

## 2018-03-01 DIAGNOSIS — D649 Anemia, unspecified: Secondary | ICD-10-CM | POA: Diagnosis not present

## 2018-03-02 ENCOUNTER — Non-Acute Institutional Stay: Payer: Medicare Other | Admitting: Nurse Practitioner

## 2018-03-02 ENCOUNTER — Encounter: Payer: Self-pay | Admitting: Nurse Practitioner

## 2018-03-02 DIAGNOSIS — G309 Alzheimer's disease, unspecified: Secondary | ICD-10-CM

## 2018-03-02 DIAGNOSIS — D649 Anemia, unspecified: Secondary | ICD-10-CM | POA: Insufficient documentation

## 2018-03-02 DIAGNOSIS — F0280 Dementia in other diseases classified elsewhere without behavioral disturbance: Secondary | ICD-10-CM

## 2018-03-02 DIAGNOSIS — N183 Chronic kidney disease, stage 3 (moderate): Secondary | ICD-10-CM

## 2018-03-02 DIAGNOSIS — N3946 Mixed incontinence: Secondary | ICD-10-CM | POA: Diagnosis not present

## 2018-03-02 DIAGNOSIS — I82502 Chronic embolism and thrombosis of unspecified deep veins of left lower extremity: Secondary | ICD-10-CM | POA: Diagnosis not present

## 2018-03-02 DIAGNOSIS — M6281 Muscle weakness (generalized): Secondary | ICD-10-CM | POA: Diagnosis not present

## 2018-03-02 DIAGNOSIS — G4700 Insomnia, unspecified: Secondary | ICD-10-CM | POA: Diagnosis not present

## 2018-03-02 DIAGNOSIS — E1122 Type 2 diabetes mellitus with diabetic chronic kidney disease: Secondary | ICD-10-CM | POA: Diagnosis not present

## 2018-03-02 DIAGNOSIS — E785 Hyperlipidemia, unspecified: Secondary | ICD-10-CM | POA: Diagnosis not present

## 2018-03-02 DIAGNOSIS — R2681 Unsteadiness on feet: Secondary | ICD-10-CM | POA: Diagnosis not present

## 2018-03-02 NOTE — Assessment & Plan Note (Signed)
Hgb 10.9 03/01/18, 12.2 02/25/18, will obtain FOBT x3. Update CBC, Fe, Fesat, TIBC, ferritin, Vit B12, Folate in 3 months.

## 2018-03-02 NOTE — Assessment & Plan Note (Signed)
Continue AL [facility] for safety and care assistance, continue Mementine and Donepezil for memory.

## 2018-03-02 NOTE — Addendum Note (Signed)
Addended by: Georgina Snell on: 03/02/2018 09:31 PM   Modules accepted: Level of Service

## 2018-03-02 NOTE — Assessment & Plan Note (Signed)
Given her advanced age, Hgb 7.9 03/01/18, will continue diet, exercise, Tradjenta 5mg  qd. Dr. Lyndel Safe advised to monitor Hgb a1c in 3 months.

## 2018-03-02 NOTE — Progress Notes (Signed)
Location:  [place] Room Number: 17 Place of Service:  ALF [phone]) Provider:  Marlana Latus  NP  [name], NP  Patient Care Team: [name], NP as PCP - General (Internal Medicine) McKenzie, Lilia Argue, MD (Inactive) (Anesthesiology) Kristeen Miss, MD as Consulting Physician (Neurosurgery) Shon Hough, MD as Consulting Physician (Ophthalmology) [name], [facility] (Skilled Nursing and [place]) [name], MD as Consulting Physician (Dermatology)  Extended Emergency Contact Information Primary Emergency Contact: Kayla Weaver Address: [address]          [name] Phone: [phone] Mobile Phone: [phone] Relation: Son Secondary Emergency Contact: Kayla Weaver, Kayla Weaver Mobile Phone: [phone] Relation: Daughter Preferred language: English Interpreter needed? No  Code Status:  DNR Goals of care: Advanced Directive information Advanced Directives 02/26/2018  Does Patient Have a Medical Advance Directive? Yes  Type of Advance Directive Out of facility DNR (pink MOST or yellow form);Healthcare Power of Attorney  Does patient want to make changes to medical advance directive? No - Patient declined  Copy of [place] in Chart? Yes - validated most recent copy scanned in chart (See row information)  Would patient like information on creating a medical advance directive? -  Pre-existing out of facility DNR order (yellow form or pink MOST form) Yellow form placed in chart (order not valid for inpatient use)     Chief Complaint  Patient presents with  . Acute Visit    C/o - Blood sugar dropping    HPI:  Pt is a 85 y.o. female seen today for an acute visit for controlled blood sugar, Hgb a1c 7.9 03/01/18, am CBG: 181, 184, 179, 184, 175, 177, 170, 196, 171, 168, 175, 179, 168, 162, 160, 175, 165, 140, 172, 157, 184, 172, 160, 137, 141, 153, 166, 148, 141, 130, 141, on  tradjenta 5mg  qd.  She has been treated for UTI since ED visit 02/25/18 for fall evaluation. On Keflex, urine culture showed no growth. Hgb 10.9 03/01/18 dropped from 12.2 02/25/18. No s/s of bleeding. HPI was provided with assistance of staff, she resides in [place] for safety and care assistance, on Memantine and Donepezil for memory.    Past Medical History:  Diagnosis Date  . Allergic rhinitis   . Asthma   . Carpal tunnel syndrome 1980  . Chronic low back pain   . Constipation 06/27/2014  . GERD (gastroesophageal reflux disease)   . Hearing loss    Hearing aids  . Hyperglycemia 06/27/2014  . Hyperlipidemia 06/27/2014  . Hypertension   . Insomnia 06/27/2014  . 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 Memory loss 06/27/2014  . Meningioma ([place]) 2000   Dr. Ellene Route  . Osteopenia    Past Surgical History:  Procedure Laterality Date  . BACK SURGERY  1999   ruptured disk  . BRAIN TUMOR EXCISION  2000   Dr. Ellene Route  . CARPAL TUNNEL RELEASE  1980  . TEAR DUCT PROBING Bilateral 2014   [place]    No Known Allergies  Outpatient Encounter Medications as of 03/02/2018  Medication Sig  . acetaminophen (TYLENOL) 325 MG tablet Take 650 mg by mouth every 6 (six) hours as needed.  Marland Kitchen amLODipine (NORVASC) 5 MG tablet Take 5 mg by mouth every morning.  Marland Kitchen atorvastatin (LIPITOR) 10 MG tablet Take 10 mg by mouth every morning.  . cephALEXin (KEFLEX) 500 MG capsule Take 1 capsule (500 mg  total) by mouth 4 (four) times daily for 7 days.  . Dextromethorphan-guaiFENesin (DIABETIC TUSSIN MAX ST) 10-200 MG/5ML LIQD Take 10 mLs by mouth every 4 (four) hours as needed.  . docusate sodium (COLACE) 100 MG capsule Take 100 mg by mouth daily.   Marland Kitchen donepezil (ARICEPT) 5 MG tablet Take 5 mg by mouth at bedtime.  . fluticasone (FLONASE) 50 MCG/ACT nasal spray Place 2 sprays into both nostrils daily.   Marland Kitchen ipratropium-albuterol (DUONEB) 0.5-2.5 (3) MG/3ML SOLN Take 3 mLs by nebulization 2 (two) times daily. while  awake for wheezing  . irbesartan (AVAPRO) 150 MG tablet Take 150 mg by mouth daily.  Marland Kitchen levothyroxine (SYNTHROID, LEVOTHROID) 75 MCG tablet Take 75 mcg by mouth daily before breakfast.  . linagliptin (TRADJENTA) 5 MG TABS tablet Take 5 mg by mouth every morning.   . memantine (NAMENDA) 5 MG tablet Take 5 mg by mouth 2 (two) times daily.  . Multiple Vitamins-Minerals (ICAPS AREDS 2) CAPS Take 1 tablet by mouth 2 (two) times daily.  Marland Kitchen omeprazole (PRILOSEC) 20 MG capsule Take 1 capsule (20 mg total) by mouth daily.  Marland Kitchen saccharomyces boulardii (FLORASTOR) 250 MG capsule Take 250 mg by mouth 2 (two) times daily.   No facility-administered encounter medications on file as of 03/02/2018.    ROS was provided with assistance of staff.  Review of Systems  Constitutional: Negative for activity change, appetite change, chills, diaphoresis, fatigue and fever.  HENT: Positive for hearing loss. Negative for congestion and voice change.   Respiratory: Positive for wheezing. Negative for cough and shortness of breath.        Chronic central expiratory wheezes per nurse.   Cardiovascular: Positive for leg swelling. Negative for chest pain and palpitations.  Gastrointestinal: Negative for abdominal distention, abdominal pain, constipation, diarrhea, nausea and vomiting.  Genitourinary: Negative for difficulty urinating, dysuria and urgency.  Musculoskeletal: Positive for gait problem.  Skin: Negative for color change and pallor.  Neurological: Negative for dizziness, speech difficulty, weakness and headaches.       Dementia  Psychiatric/Behavioral: Positive for confusion. Negative for agitation, behavioral problems, hallucinations and sleep disturbance. The patient is not nervous/anxious.     Immunization History  Administered Date(s) Administered  . Influenza-Unspecified 10/20/2013, 10/05/2014, 10/18/2015, 10/15/2016, 10/12/2017  . PPD Test 10/10/2013, 03/23/2015, 04/10/2015  . Pneumococcal  Polysaccharide-23 02/06/2009  . Td 10/11/2010  . Zoster 01/08/2012   Pertinent  Health Maintenance Due  Topic Date Due  . FOOT EXAM  10/07/1932  . OPHTHALMOLOGY EXAM  02/27/2018  . HEMOGLOBIN A1C  03/25/2018  . INFLUENZA VACCINE  Completed  . DEXA SCAN  Completed  . PNA vac Low Risk Adult  Completed   Fall Risk  09/10/2017 08/27/2016 06/12/2015 06/12/2015 03/13/2015  Falls in the past year? No No No No Yes  Number falls in past yr: - - - - 1  Injury with Fall? - - - - No   Functional Status Survey:    Vitals:   03/02/18 1007  BP: (!) 143/62  Pulse: 87  Resp: (!) 22  Temp: (!) 97.3 F (36.3 C)  SpO2: (!) 87%  Weight: 160 lb 12.8 oz (72.9 kg)  Height: 5\' 5"  (1.651 m)   Body mass index is 26.76 kg/m. Physical Exam Constitutional:      General: She is not in acute distress.    Appearance: Normal appearance. She is not ill-appearing, toxic-appearing or diaphoretic.  HENT:     Head: Normocephalic and atraumatic.     Nose:  Nose normal.     Mouth/Throat:     Mouth: Mucous membranes are moist.  Eyes:     Extraocular Movements: Extraocular movements intact.     Pupils: Pupils are equal, round, and reactive to light.  Neck:     Musculoskeletal: Normal range of motion and neck supple.  Cardiovascular:     Rate and Rhythm: Normal rate and regular rhythm.     Heart sounds: No murmur.  Pulmonary:     Effort: Pulmonary effort is normal.     Breath sounds: Wheezing present. No rhonchi or rales.     Comments: Central expiratory wheezes is chronic per nurse.  Abdominal:     General: There is no distension.     Palpations: Abdomen is soft.     Tenderness: There is no abdominal tenderness. There is no guarding or rebound.  Musculoskeletal:     Right lower leg: Edema present.     Left lower leg: Edema present.     Comments: Trace edema BLE. Ambulates with walker.   Skin:    General: Skin is warm and dry.     Coloration: Skin is not pale.     Findings: No erythema.  Neurological:       General: No focal deficit present.     Mental Status: She is alert. Mental status is at baseline.     Cranial Nerves: No cranial nerve deficit.     Motor: No weakness.     Coordination: Coordination normal.     Gait: Gait abnormal.     Comments: Oriented to person and her room on unit.   Psychiatric:        Mood and Affect: Mood normal.        Behavior: Behavior normal.     Labs reviewed: Recent Labs    03/30/17 06/18/17 09/17/17 09/24/17 02/25/18 1753  NA 138 141  141 138 139 137  K 4.3 4.5  4.5 4.4  --  4.3  CL 103 102  --   --  102  CO2 29 29  --   --  26  GLUCOSE  --   --   --   --  181*  BUN 27* 27* 22* 19 18  CREATININE 1.2* 1.3*  1.26 1.34 1.2* 1.12*  CALCIUM 9.4 9.2  9.2 9.0  --  8.9   Recent Labs    03/05/17 06/18/17 09/24/17  AST 13  13 13 14   ALT 12  12 16 15   ALKPHOS 60  60 64 66  BILITOT 0.4 0.4  --   PROT 6.1  6.1 6.4  6.4  --   ALBUMIN 3.8  3.8 3.9  3.9  --    Recent Labs    06/18/17 09/24/17 02/25/18 1753  WBC 8.0  8 9.2 9.2  NEUTROABS 4,016 4,618 6.1  HGB 11.8*  11.8 12.3 12.2  HCT 35*  34.7 37 38.7  MCV 92.3  --  93.3  PLT 261 273 269   Lab Results  Component Value Date   TSH 4.31 09/24/2017   Lab Results  Component Value Date   HGBA1C 7.4 09/24/2017   Lab Results  Component Value Date   CHOL 172 06/18/2017   CHOL 172 06/18/2017   HDL 41 06/18/2017   LDLCALC 100 06/18/2017   LDLCALC 100 06/18/2017   TRIG 193 06/18/2017   TRIG 193 (A) 06/18/2017    Significant Diagnostic Results in last 30 days:  Dg Chest 2 View  Result Date: 02/25/2018  CLINICAL DATA:  Chest pain following a fall. EXAM: CHEST - 2 VIEW COMPARISON:  02/03/2015. FINDINGS: Poor inspiration. No gross change in a normal sized heart. Progressive atheromatous aortic calcifications. Clear lungs. No fracture or pneumothorax seen. Mild thoracic spine degenerative changes. IMPRESSION: No acute abnormality. Electronically Signed   By: Claudie Revering M.D.   On:  02/25/2018 17:06   Ct Head Wo Contrast  Result Date: 02/25/2018 CLINICAL DATA:  Unwitnessed fall out of bed. EXAM: CT HEAD WITHOUT CONTRAST CT MAXILLOFACIAL WITHOUT CONTRAST CT CERVICAL SPINE WITHOUT CONTRAST TECHNIQUE: Multidetector CT imaging of the head, cervical spine, and maxillofacial structures were performed using the standard protocol without intravenous contrast. Multiplanar CT image reconstructions of the cervical spine and maxillofacial structures were also generated. COMPARISON:  02/03/2015, head CT. FINDINGS: CT HEAD FINDINGS Brain: No evidence of acute infarction, hemorrhage, hydrocephalus, extra-axial collection or mass lesion/mass effect. Marked brain parenchymal volume loss and deep white matter microangiopathy. Postsurgical changes in the left posterior fossa. Vascular: Calcific atherosclerotic disease of the intra cavernous carotid arteries. Skull: Normal. Negative for fracture or focal lesion. Left posterior skull postsurgical defect. Other: None. CT MAXILLOFACIAL FINDINGS Osseous: No fracture or mandibular dislocation. No destructive process. Orbits: Negative. No traumatic or inflammatory finding. Sinuses: Polypoid mucosal thickening of the right maxillary sinus right ethmoid sinus. Soft tissues: Mild soft tissue swelling of the nose. CT CERVICAL SPINE FINDINGS Alignment: Straightening of cervical lordosis. Skull base and vertebrae: No acute fracture. No primary bone lesion or focal pathologic process. Soft tissues and spinal canal: No prevertebral fluid or swelling. No visible canal hematoma. Disc levels: Multilevel osteoarthritic changes, mild. C5-C6 and C6-C7 disc osteophyte complexes causing mild impression of the ventral spinal canal. Upper chest: Negative. Other: none IMPRESSION: 1. No acute intracranial abnormality. 2. Marked brain parenchymal atrophy and chronic microvascular disease. 3. No evidence of acute traumatic injury to cervical spine. 4. Mild soft tissue swelling of the  nose. 5. Multilevel osteoarthritic changes of the cervical spine, mild. Electronically Signed   By: Fidela Salisbury M.D.   On: 02/25/2018 17:34   Ct Cervical Spine Wo Contrast  Result Date: 02/25/2018 CLINICAL DATA:  Unwitnessed fall out of bed. EXAM: CT HEAD WITHOUT CONTRAST CT MAXILLOFACIAL WITHOUT CONTRAST CT CERVICAL SPINE WITHOUT CONTRAST TECHNIQUE: Multidetector CT imaging of the head, cervical spine, and maxillofacial structures were performed using the standard protocol without intravenous contrast. Multiplanar CT image reconstructions of the cervical spine and maxillofacial structures were also generated. COMPARISON:  02/03/2015, head CT. FINDINGS: CT HEAD FINDINGS Brain: No evidence of acute infarction, hemorrhage, hydrocephalus, extra-axial collection or mass lesion/mass effect. Marked brain parenchymal volume loss and deep white matter microangiopathy. Postsurgical changes in the left posterior fossa. Vascular: Calcific atherosclerotic disease of the intra cavernous carotid arteries. Skull: Normal. Negative for fracture or focal lesion. Left posterior skull postsurgical defect. Other: None. CT MAXILLOFACIAL FINDINGS Osseous: No fracture or mandibular dislocation. No destructive process. Orbits: Negative. No traumatic or inflammatory finding. Sinuses: Polypoid mucosal thickening of the right maxillary sinus right ethmoid sinus. Soft tissues: Mild soft tissue swelling of the nose. CT CERVICAL SPINE FINDINGS Alignment: Straightening of cervical lordosis. Skull base and vertebrae: No acute fracture. No primary bone lesion or focal pathologic process. Soft tissues and spinal canal: No prevertebral fluid or swelling. No visible canal hematoma. Disc levels: Multilevel osteoarthritic changes, mild. C5-C6 and C6-C7 disc osteophyte complexes causing mild impression of the ventral spinal canal. Upper chest: Negative. Other: none IMPRESSION: 1. No acute intracranial abnormality. 2. Marked brain parenchymal  atrophy and chronic microvascular disease. 3. No evidence of acute traumatic injury to cervical spine. 4. Mild soft tissue swelling of the nose. 5. Multilevel osteoarthritic changes of the cervical spine, mild. Electronically Signed   By: Fidela Salisbury M.D.   On: 02/25/2018 17:34   Dg Hip Unilat W Or Wo Pelvis 2-3 Views Right  Result Date: 02/25/2018 CLINICAL DATA:  Fall with right hip pain. EXAM: DG HIP (WITH OR WITHOUT PELVIS) 2-3V RIGHT COMPARISON:  None. FINDINGS: Mild symmetric degenerative change of the hips. No acute fracture or dislocation. Degenerative change of the spine. IMPRESSION: No acute findings. Electronically Signed   By: Marin Olp M.D.   On: 02/25/2018 17:05   Ct Maxillofacial Wo Contrast  Result Date: 02/25/2018 CLINICAL DATA:  Unwitnessed fall out of bed. EXAM: CT HEAD WITHOUT CONTRAST CT MAXILLOFACIAL WITHOUT CONTRAST CT CERVICAL SPINE WITHOUT CONTRAST TECHNIQUE: Multidetector CT imaging of the head, cervical spine, and maxillofacial structures were performed using the standard protocol without intravenous contrast. Multiplanar CT image reconstructions of the cervical spine and maxillofacial structures were also generated. COMPARISON:  02/03/2015, head CT. FINDINGS: CT HEAD FINDINGS Brain: No evidence of acute infarction, hemorrhage, hydrocephalus, extra-axial collection or mass lesion/mass effect. Marked brain parenchymal volume loss and deep white matter microangiopathy. Postsurgical changes in the left posterior fossa. Vascular: Calcific atherosclerotic disease of the intra cavernous carotid arteries. Skull: Normal. Negative for fracture or focal lesion. Left posterior skull postsurgical defect. Other: None. CT MAXILLOFACIAL FINDINGS Osseous: No fracture or mandibular dislocation. No destructive process. Orbits: Negative. No traumatic or inflammatory finding. Sinuses: Polypoid mucosal thickening of the right maxillary sinus right ethmoid sinus. Soft tissues: Mild soft  tissue swelling of the nose. CT CERVICAL SPINE FINDINGS Alignment: Straightening of cervical lordosis. Skull base and vertebrae: No acute fracture. No primary bone lesion or focal pathologic process. Soft tissues and spinal canal: No prevertebral fluid or swelling. No visible canal hematoma. Disc levels: Multilevel osteoarthritic changes, mild. C5-C6 and C6-C7 disc osteophyte complexes causing mild impression of the ventral spinal canal. Upper chest: Negative. Other: none IMPRESSION: 1. No acute intracranial abnormality. 2. Marked brain parenchymal atrophy and chronic microvascular disease. 3. No evidence of acute traumatic injury to cervical spine. 4. Mild soft tissue swelling of the nose. 5. Multilevel osteoarthritic changes of the cervical spine, mild. Electronically Signed   By: Fidela Salisbury M.D.   On: 02/25/2018 17:34    Assessment/Plan DM (diabetes mellitus), type 2 with renal complications [facility]) Given her advanced age, Hgb 7.9 03/01/18, will continue diet, exercise, Tradjenta 5mg  qd. Dr. Lyndel Safe advised to monitor Hgb a1c in 3 months.   Alzheimer disease Continue AL [facility] for safety and care assistance, continue Mementine and Donepezil for memory.   Anemia Hgb 10.9 03/01/18, 12.2 02/25/18, will obtain FOBT x3. Update CBC, Fe, Fesat, TIBC, ferritin, Vit B12, Folate in 3 months.      Family/ staff Communication: plan of care reviewed with the patient and charge nurse.   Labs/tests ordered:  FOBT x3. CBC, Fe, FeSat, Ferritin, TIBC, Vit B12, Folate in 3 months.   Time spend 25 minutes.

## 2018-03-03 DIAGNOSIS — N3946 Mixed incontinence: Secondary | ICD-10-CM | POA: Diagnosis not present

## 2018-03-03 DIAGNOSIS — R2681 Unsteadiness on feet: Secondary | ICD-10-CM | POA: Diagnosis not present

## 2018-03-03 DIAGNOSIS — E785 Hyperlipidemia, unspecified: Secondary | ICD-10-CM | POA: Diagnosis not present

## 2018-03-03 DIAGNOSIS — M6281 Muscle weakness (generalized): Secondary | ICD-10-CM | POA: Diagnosis not present

## 2018-03-03 DIAGNOSIS — I82502 Chronic embolism and thrombosis of unspecified deep veins of left lower extremity: Secondary | ICD-10-CM | POA: Diagnosis not present

## 2018-03-03 DIAGNOSIS — G4700 Insomnia, unspecified: Secondary | ICD-10-CM | POA: Diagnosis not present

## 2018-03-05 DIAGNOSIS — R2681 Unsteadiness on feet: Secondary | ICD-10-CM | POA: Diagnosis not present

## 2018-03-05 DIAGNOSIS — N3946 Mixed incontinence: Secondary | ICD-10-CM | POA: Diagnosis not present

## 2018-03-05 DIAGNOSIS — E785 Hyperlipidemia, unspecified: Secondary | ICD-10-CM | POA: Diagnosis not present

## 2018-03-05 DIAGNOSIS — M6281 Muscle weakness (generalized): Secondary | ICD-10-CM | POA: Diagnosis not present

## 2018-03-05 DIAGNOSIS — G4700 Insomnia, unspecified: Secondary | ICD-10-CM | POA: Diagnosis not present

## 2018-03-05 DIAGNOSIS — I82502 Chronic embolism and thrombosis of unspecified deep veins of left lower extremity: Secondary | ICD-10-CM | POA: Diagnosis not present

## 2018-03-08 ENCOUNTER — Other Ambulatory Visit: Payer: Self-pay

## 2018-03-08 DIAGNOSIS — G4700 Insomnia, unspecified: Secondary | ICD-10-CM | POA: Diagnosis not present

## 2018-03-08 DIAGNOSIS — M6281 Muscle weakness (generalized): Secondary | ICD-10-CM | POA: Diagnosis not present

## 2018-03-08 DIAGNOSIS — R2681 Unsteadiness on feet: Secondary | ICD-10-CM | POA: Diagnosis not present

## 2018-03-08 DIAGNOSIS — I82502 Chronic embolism and thrombosis of unspecified deep veins of left lower extremity: Secondary | ICD-10-CM | POA: Diagnosis not present

## 2018-03-08 DIAGNOSIS — E785 Hyperlipidemia, unspecified: Secondary | ICD-10-CM | POA: Diagnosis not present

## 2018-03-08 DIAGNOSIS — N3946 Mixed incontinence: Secondary | ICD-10-CM | POA: Diagnosis not present

## 2018-03-08 MED ORDER — MORPHINE SULFATE (CONCENTRATE) 20 MG/ML PO SOLN: 5.0000 mg | ORAL | 0 refills | Status: DC | PRN

## 2018-03-08 NOTE — Telephone Encounter (Signed)
Pharmacy called and said they can not refill for less than 30 ml for Roxinol.

## 2018-03-08 NOTE — Addendum Note (Signed)
Addended by: Ruthell Rummage A on: 03/08/2018 04:41 PM   Modules accepted: Orders

## 2018-03-09 ENCOUNTER — Other Ambulatory Visit: Payer: Self-pay | Admitting: *Deleted

## 2018-03-09 DIAGNOSIS — D631 Anemia in chronic kidney disease: Secondary | ICD-10-CM | POA: Diagnosis not present

## 2018-03-09 DIAGNOSIS — D329 Benign neoplasm of meninges, unspecified: Secondary | ICD-10-CM | POA: Diagnosis not present

## 2018-03-09 DIAGNOSIS — N183 Chronic kidney disease, stage 3 (moderate): Secondary | ICD-10-CM | POA: Diagnosis not present

## 2018-03-09 DIAGNOSIS — N3946 Mixed incontinence: Secondary | ICD-10-CM | POA: Diagnosis not present

## 2018-03-09 DIAGNOSIS — R2681 Unsteadiness on feet: Secondary | ICD-10-CM | POA: Diagnosis not present

## 2018-03-09 DIAGNOSIS — E039 Hypothyroidism, unspecified: Secondary | ICD-10-CM | POA: Diagnosis not present

## 2018-03-09 DIAGNOSIS — M545 Low back pain: Secondary | ICD-10-CM | POA: Diagnosis not present

## 2018-03-09 DIAGNOSIS — I82502 Chronic embolism and thrombosis of unspecified deep veins of left lower extremity: Secondary | ICD-10-CM | POA: Diagnosis not present

## 2018-03-09 DIAGNOSIS — K219 Gastro-esophageal reflux disease without esophagitis: Secondary | ICD-10-CM | POA: Diagnosis not present

## 2018-03-09 DIAGNOSIS — I129 Hypertensive chronic kidney disease with stage 1 through stage 4 chronic kidney disease, or unspecified chronic kidney disease: Secondary | ICD-10-CM | POA: Diagnosis not present

## 2018-03-09 DIAGNOSIS — F0280 Dementia in other diseases classified elsewhere without behavioral disturbance: Secondary | ICD-10-CM | POA: Diagnosis not present

## 2018-03-09 DIAGNOSIS — G4700 Insomnia, unspecified: Secondary | ICD-10-CM | POA: Diagnosis not present

## 2018-03-09 DIAGNOSIS — I82509 Chronic embolism and thrombosis of unspecified deep veins of unspecified lower extremity: Secondary | ICD-10-CM | POA: Diagnosis not present

## 2018-03-09 DIAGNOSIS — M6281 Muscle weakness (generalized): Secondary | ICD-10-CM | POA: Diagnosis not present

## 2018-03-09 DIAGNOSIS — G309 Alzheimer's disease, unspecified: Secondary | ICD-10-CM | POA: Diagnosis not present

## 2018-03-09 DIAGNOSIS — S060X9D Concussion with loss of consciousness of unspecified duration, subsequent encounter: Secondary | ICD-10-CM | POA: Diagnosis not present

## 2018-03-09 DIAGNOSIS — E1122 Type 2 diabetes mellitus with diabetic chronic kidney disease: Secondary | ICD-10-CM | POA: Diagnosis not present

## 2018-03-09 DIAGNOSIS — J439 Emphysema, unspecified: Secondary | ICD-10-CM | POA: Diagnosis not present

## 2018-03-09 DIAGNOSIS — E785 Hyperlipidemia, unspecified: Secondary | ICD-10-CM | POA: Diagnosis not present

## 2018-03-10 DIAGNOSIS — G309 Alzheimer's disease, unspecified: Secondary | ICD-10-CM | POA: Diagnosis not present

## 2018-03-10 DIAGNOSIS — I129 Hypertensive chronic kidney disease with stage 1 through stage 4 chronic kidney disease, or unspecified chronic kidney disease: Secondary | ICD-10-CM | POA: Diagnosis not present

## 2018-03-10 DIAGNOSIS — N183 Chronic kidney disease, stage 3 (moderate): Secondary | ICD-10-CM | POA: Diagnosis not present

## 2018-03-10 DIAGNOSIS — S060X9D Concussion with loss of consciousness of unspecified duration, subsequent encounter: Secondary | ICD-10-CM | POA: Diagnosis not present

## 2018-03-10 DIAGNOSIS — F0280 Dementia in other diseases classified elsewhere without behavioral disturbance: Secondary | ICD-10-CM | POA: Diagnosis not present

## 2018-03-10 DIAGNOSIS — E1122 Type 2 diabetes mellitus with diabetic chronic kidney disease: Secondary | ICD-10-CM | POA: Diagnosis not present

## 2018-03-10 MED ORDER — MORPHINE SULFATE (CONCENTRATE) 20 MG/ML PO SOLN: ORAL | 0 refills | Status: AC

## 2018-03-12 ENCOUNTER — Encounter: Payer: Self-pay | Admitting: Internal Medicine

## 2018-03-12 ENCOUNTER — Non-Acute Institutional Stay: Payer: Medicare Other | Admitting: Internal Medicine

## 2018-03-12 DIAGNOSIS — S060X9D Concussion with loss of consciousness of unspecified duration, subsequent encounter: Secondary | ICD-10-CM | POA: Diagnosis not present

## 2018-03-12 DIAGNOSIS — J441 Chronic obstructive pulmonary disease with (acute) exacerbation: Secondary | ICD-10-CM | POA: Diagnosis not present

## 2018-03-12 DIAGNOSIS — E1122 Type 2 diabetes mellitus with diabetic chronic kidney disease: Secondary | ICD-10-CM | POA: Diagnosis not present

## 2018-03-12 DIAGNOSIS — Z515 Encounter for palliative care: Secondary | ICD-10-CM

## 2018-03-12 DIAGNOSIS — J431 Panlobular emphysema: Secondary | ICD-10-CM | POA: Diagnosis not present

## 2018-03-12 DIAGNOSIS — I129 Hypertensive chronic kidney disease with stage 1 through stage 4 chronic kidney disease, or unspecified chronic kidney disease: Secondary | ICD-10-CM | POA: Diagnosis not present

## 2018-03-12 DIAGNOSIS — N183 Chronic kidney disease, stage 3 (moderate): Secondary | ICD-10-CM | POA: Diagnosis not present

## 2018-03-12 DIAGNOSIS — F0280 Dementia in other diseases classified elsewhere without behavioral disturbance: Secondary | ICD-10-CM | POA: Diagnosis not present

## 2018-03-12 DIAGNOSIS — G309 Alzheimer's disease, unspecified: Secondary | ICD-10-CM | POA: Diagnosis not present

## 2018-03-12 MED ORDER — LORAZEPAM 0.5 MG PO TABS: 0.5000 mg | ORAL_TABLET | ORAL | 0 refills | Status: DC | PRN

## 2018-03-12 NOTE — Progress Notes (Signed)
Location:  [facility] Room Number: 17 Place of Service:  ALF [phone]) Provider:  Veleta Miners MD   Mast, Man X, NP  Patient Care Team: Mast, Man X, NP as PCP - General (Internal Medicine) McKenzie, Lilia Argue, MD (Inactive) (Anesthesiology) Kristeen Miss, MD as Consulting Physician (Neurosurgery) Shon Hough, MD as Consulting Physician (Ophthalmology) [name], [facility] (Skilled Nursing and [place]) [name], MD as Consulting Physician (Dermatology)  Extended Emergency Contact Information Primary Emergency Contact: [name] Address: [address]          [name] Phone: [phone] Mobile Phone: [phone] Relation: Son Secondary Emergency Contact: Jaide, [place] Mobile Phone: [phone] Relation: Daughter Preferred language: English Interpreter needed? No  Code Status:  DNR Goals of care: Advanced Directive information Advanced Directives 03/12/2018  Does Patient Have a Medical Advance Directive? Yes  Type of Paramedic of [place];Out of facility DNR (pink MOST or yellow form)  Does patient want to make changes to medical advance directive? No - Patient declined  Copy of [place] in Chart? Yes - validated most recent copy scanned in chart (See row information)  Would patient like information on creating a medical advance directive? -  Pre-existing out of facility DNR order (yellow form or pink MOST form) Yellow form placed in chart (order not valid for inpatient use)     Chief Complaint  Patient presents with  . Acute Visit    HPI:  Pt is a 85 y.o. female seen today for an acute visit for End of Life Care at request of Nurse and Family  Patient has h/o Hypertension, Diabetes mellitus, Hyperlipidemia,Hypothyroid,And Cognitive impairment  Patient recently became SOB with Wheezing low grade fever and Cough. Family has decided  to make her hospice. Patient was started on morphine few days ago. She was unable to give me any history denied any pain.  Was coughing and looked uncomfortable with Breathing. Patient also is having restless leg.  She also seems flushed and sweaty.  The daughter-in-law was in the room and I discussed everything with her.  Past Medical History:  Diagnosis Date  . Allergic rhinitis   . Asthma   . Carpal tunnel syndrome 1980  . Chronic low back pain   . Constipation 06/27/2014  . GERD (gastroesophageal reflux disease)   . Hearing loss    Hearing aids  . Hyperglycemia 06/27/2014  . Hyperlipidemia 06/27/2014  . Hypertension   . Insomnia 06/27/2014  . 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 Memory loss 06/27/2014  . Meningioma ([place]) 2000   Dr. Ellene Route  . Osteopenia    Past Surgical History:  Procedure Laterality Date  . BACK SURGERY  1999   ruptured disk  . BRAIN TUMOR EXCISION  2000   Dr. Ellene Route  . CARPAL TUNNEL RELEASE  1980  . TEAR DUCT PROBING Bilateral 2014   [place]    No Known Allergies  Outpatient Encounter Medications as of 03/12/2018  Medication Sig  . acetaminophen (TYLENOL) 650 MG suppository Place 650 mg rectally every 4 (four) hours as needed.  Marland Kitchen Dextromethorphan-guaiFENesin (DIABETIC TUSSIN MAX ST) 10-200 MG/5ML LIQD Take 10 mLs by mouth every 4 (four) hours as needed.  Marland Kitchen ipratropium-albuterol (DUONEB) 0.5-2.5 (3) MG/3ML SOLN Take 3 mLs by nebulization every 8 (eight) hours as needed. while awake for wheezing  . levothyroxine (SYNTHROID, LEVOTHROID) 75 MCG tablet Take 75 mcg by mouth daily  before breakfast.  . LORazepam (ATIVAN) 0.5 MG tablet Take 1 tablet (0.5 mg total) by mouth every 4 (four) hours as needed for up to 30 doses for anxiety.  Marland Kitchen morphine (ROXANOL) 20 MG/ML concentrated solution Give 0.78ml sl every 4 hours prn for agitation, restlessness. anxiety  . [DISCONTINUED] acetaminophen (TYLENOL) 325 MG tablet Take 650 mg by mouth every 4 (four) hours as  needed.   . [DISCONTINUED] acetaminophen (TYLENOL) 650 MG CR tablet Take 650 mg by mouth as needed for pain.  . [DISCONTINUED] amLODipine (NORVASC) 5 MG tablet Take 5 mg by mouth every morning.  . [DISCONTINUED] atorvastatin (LIPITOR) 10 MG tablet Take 10 mg by mouth every morning.  . [DISCONTINUED] docusate sodium (COLACE) 100 MG capsule Take 100 mg by mouth daily.   . [DISCONTINUED] donepezil (ARICEPT) 5 MG tablet Take 5 mg by mouth at bedtime.  . [DISCONTINUED] fluticasone (FLONASE) 50 MCG/ACT nasal spray Place 2 sprays into both nostrils daily.   . [DISCONTINUED] irbesartan (AVAPRO) 150 MG tablet Take 150 mg by mouth daily.  . [DISCONTINUED] linagliptin (TRADJENTA) 5 MG TABS tablet Take 5 mg by mouth every morning.   . [DISCONTINUED] memantine (NAMENDA) 5 MG tablet Take 5 mg by mouth 2 (two) times daily.  . [DISCONTINUED] Multiple Vitamins-Minerals (ICAPS AREDS 2) CAPS Take 1 tablet by mouth 2 (two) times daily.  . [DISCONTINUED] omeprazole (PRILOSEC) 20 MG capsule Take 1 capsule (20 mg total) by mouth daily.   No facility-administered encounter medications on file as of 03/12/2018.     Review of Systems  Unable to perform ROS: Dementia    Immunization History  Administered Date(s) Administered  . Influenza-Unspecified 10/20/2013, 10/05/2014, 10/18/2015, 10/15/2016, 10/12/2017  . PPD Test 10/10/2013, 03/23/2015, 04/10/2015  . Pneumococcal Polysaccharide-23 02/06/2009  . Td 10/11/2010  . Zoster 01/08/2012   Pertinent  Health Maintenance Due  Topic Date Due  . FOOT EXAM  10/07/1932  . OPHTHALMOLOGY EXAM  02/27/2018  . HEMOGLOBIN A1C  03/25/2018  . INFLUENZA VACCINE  Completed  . DEXA SCAN  Completed  . PNA vac Low Risk Adult  Completed   Fall Risk  09/10/2017 08/27/2016 06/12/2015 06/12/2015 03/13/2015  Falls in the past year? No No No No Yes  Number falls in past yr: - - - - 1  Injury with Fall? - - - - No   Functional Status Survey:    Vitals:   03/12/18 1334  BP: (!) 129/43    Pulse: (!) 43  Resp: 12  Temp: (!) 97.1 F (36.2 C)  SpO2: 96%  Weight: 161 lb (73 kg)  Height: 5\' 5"  (1.651 m)   Body mass index is 26.79 kg/m. Physical Exam Vitals signs reviewed.  Constitutional:      Appearance: Normal appearance.  HENT:     Head: Normocephalic.     Comments: Face was flushed and sweaty    Nose: Nose normal.     Mouth/Throat:     Mouth: Mucous membranes are moist.     Pharynx: Oropharynx is clear.  Eyes:     Pupils: Pupils are equal, round, and reactive to light.  Neck:     Musculoskeletal: Neck supple.  Cardiovascular:     Rate and Rhythm: Normal rate and regular rhythm.     Pulses: Normal pulses.     Heart sounds: Normal heart sounds.  Pulmonary:     Effort: Pulmonary effort is normal.     Breath sounds: Normal breath sounds.     Comments: Patient has Bilateral  Rales and Wheezing Abdominal:     General: Abdomen is flat. Bowel sounds are normal.     Palpations: Abdomen is soft.  Musculoskeletal:     Comments: Trace edema Bilateral  Skin:    General: Skin is warm and dry.  Neurological:     General: No focal deficit present.     Mental Status: She is alert.     Comments: Pleasantly Demented  Psychiatric:        Mood and Affect: Mood normal.        Thought Content: Thought content normal.     Labs reviewed: Recent Labs    03/30/17  06/18/17 09/17/17 09/24/17 02/25/18 1753  NA 138   < > 141  141 138 139 137  K 4.3  --  4.5  4.5 4.4  --  4.3  CL 103  --  102  --   --  102  CO2 29  --  29  --   --  26  GLUCOSE  --   --   --   --   --  181*  BUN 27*  --  27* 22* 19 18  CREATININE 1.2*   < > 1.3*  1.26 1.34 1.2* 1.12*  CALCIUM 9.4  --  9.2  9.2 9.0  --  8.9   < > = values in this interval not displayed.   Recent Labs    06/18/17 09/24/17  AST 13 14  ALT 16 15  ALKPHOS 64 66  BILITOT 0.4  --   PROT 6.4  6.4  --   ALBUMIN 3.9  3.9  --    Recent Labs    06/18/17 09/24/17 02/25/18 1753  WBC 8.0  8 9.2 9.2  NEUTROABS  4,016 4,618 6.1  HGB 11.8*  11.8 12.3 12.2  HCT 35*  34.7 37 38.7  MCV 92.3  --  93.3  PLT 261 273 269   Lab Results  Component Value Date   TSH 4.31 09/24/2017   Lab Results  Component Value Date   HGBA1C 7.4 09/24/2017   Lab Results  Component Value Date   CHOL 172 06/18/2017   CHOL 172 06/18/2017   HDL 41 06/18/2017   LDLCALC 100 06/18/2017   LDLCALC 100 06/18/2017   TRIG 193 06/18/2017   TRIG 193 (A) 06/18/2017    Significant Diagnostic Results in last 30 days:  Dg Chest 2 View  Result Date: 02/25/2018 CLINICAL DATA:  Chest pain following a fall. EXAM: CHEST - 2 VIEW COMPARISON:  02/03/2015. FINDINGS: Poor inspiration. No gross change in a normal sized heart. Progressive atheromatous aortic calcifications. Clear lungs. No fracture or pneumothorax seen. Mild thoracic spine degenerative changes. IMPRESSION: No acute abnormality. Electronically Signed   By: Claudie Revering M.D.   On: 02/25/2018 17:06   Ct Head Wo Contrast  Result Date: 02/25/2018 CLINICAL DATA:  Unwitnessed fall out of bed. EXAM: CT HEAD WITHOUT CONTRAST CT MAXILLOFACIAL WITHOUT CONTRAST CT CERVICAL SPINE WITHOUT CONTRAST TECHNIQUE: Multidetector CT imaging of the head, cervical spine, and maxillofacial structures were performed using the standard protocol without intravenous contrast. Multiplanar CT image reconstructions of the cervical spine and maxillofacial structures were also generated. COMPARISON:  02/03/2015, head CT. FINDINGS: CT HEAD FINDINGS Brain: No evidence of acute infarction, hemorrhage, hydrocephalus, extra-axial collection or mass lesion/mass effect. Marked brain parenchymal volume loss and deep white matter microangiopathy. Postsurgical changes in the left posterior fossa. Vascular: Calcific atherosclerotic disease of the intra cavernous carotid arteries. Skull: Normal. Negative  for fracture or focal lesion. Left posterior skull postsurgical defect. Other: None. CT MAXILLOFACIAL FINDINGS Osseous: No  fracture or mandibular dislocation. No destructive process. Orbits: Negative. No traumatic or inflammatory finding. Sinuses: Polypoid mucosal thickening of the right maxillary sinus right ethmoid sinus. Soft tissues: Mild soft tissue swelling of the nose. CT CERVICAL SPINE FINDINGS Alignment: Straightening of cervical lordosis. Skull base and vertebrae: No acute fracture. No primary bone lesion or focal pathologic process. Soft tissues and spinal canal: No prevertebral fluid or swelling. No visible canal hematoma. Disc levels: Multilevel osteoarthritic changes, mild. C5-C6 and C6-C7 disc osteophyte complexes causing mild impression of the ventral spinal canal. Upper chest: Negative. Other: none IMPRESSION: 1. No acute intracranial abnormality. 2. Marked brain parenchymal atrophy and chronic microvascular disease. 3. No evidence of acute traumatic injury to cervical spine. 4. Mild soft tissue swelling of the nose. 5. Multilevel osteoarthritic changes of the cervical spine, mild. Electronically Signed   By: Fidela Salisbury M.D.   On: 02/25/2018 17:34   Ct Cervical Spine Wo Contrast  Result Date: 02/25/2018 CLINICAL DATA:  Unwitnessed fall out of bed. EXAM: CT HEAD WITHOUT CONTRAST CT MAXILLOFACIAL WITHOUT CONTRAST CT CERVICAL SPINE WITHOUT CONTRAST TECHNIQUE: Multidetector CT imaging of the head, cervical spine, and maxillofacial structures were performed using the standard protocol without intravenous contrast. Multiplanar CT image reconstructions of the cervical spine and maxillofacial structures were also generated. COMPARISON:  02/03/2015, head CT. FINDINGS: CT HEAD FINDINGS Brain: No evidence of acute infarction, hemorrhage, hydrocephalus, extra-axial collection or mass lesion/mass effect. Marked brain parenchymal volume loss and deep white matter microangiopathy. Postsurgical changes in the left posterior fossa. Vascular: Calcific atherosclerotic disease of the intra cavernous carotid arteries. Skull:  Normal. Negative for fracture or focal lesion. Left posterior skull postsurgical defect. Other: None. CT MAXILLOFACIAL FINDINGS Osseous: No fracture or mandibular dislocation. No destructive process. Orbits: Negative. No traumatic or inflammatory finding. Sinuses: Polypoid mucosal thickening of the right maxillary sinus right ethmoid sinus. Soft tissues: Mild soft tissue swelling of the nose. CT CERVICAL SPINE FINDINGS Alignment: Straightening of cervical lordosis. Skull base and vertebrae: No acute fracture. No primary bone lesion or focal pathologic process. Soft tissues and spinal canal: No prevertebral fluid or swelling. No visible canal hematoma. Disc levels: Multilevel osteoarthritic changes, mild. C5-C6 and C6-C7 disc osteophyte complexes causing mild impression of the ventral spinal canal. Upper chest: Negative. Other: none IMPRESSION: 1. No acute intracranial abnormality. 2. Marked brain parenchymal atrophy and chronic microvascular disease. 3. No evidence of acute traumatic injury to cervical spine. 4. Mild soft tissue swelling of the nose. 5. Multilevel osteoarthritic changes of the cervical spine, mild. Electronically Signed   By: Fidela Salisbury M.D.   On: 02/25/2018 17:34   Dg Hip Unilat W Or Wo Pelvis 2-3 Views Right  Result Date: 02/25/2018 CLINICAL DATA:  Fall with right hip pain. EXAM: DG HIP (WITH OR WITHOUT PELVIS) 2-3V RIGHT COMPARISON:  None. FINDINGS: Mild symmetric degenerative change of the hips. No acute fracture or dislocation. Degenerative change of the spine. IMPRESSION: No acute findings. Electronically Signed   By: Marin Olp M.D.   On: 02/25/2018 17:05   Ct Maxillofacial Wo Contrast  Result Date: 02/25/2018 CLINICAL DATA:  Unwitnessed fall out of bed. EXAM: CT HEAD WITHOUT CONTRAST CT MAXILLOFACIAL WITHOUT CONTRAST CT CERVICAL SPINE WITHOUT CONTRAST TECHNIQUE: Multidetector CT imaging of the head, cervical spine, and maxillofacial structures were performed using the  standard protocol without intravenous contrast. Multiplanar CT image reconstructions of the cervical spine and  maxillofacial structures were also generated. COMPARISON:  02/03/2015, head CT. FINDINGS: CT HEAD FINDINGS Brain: No evidence of acute infarction, hemorrhage, hydrocephalus, extra-axial collection or mass lesion/mass effect. Marked brain parenchymal volume loss and deep white matter microangiopathy. Postsurgical changes in the left posterior fossa. Vascular: Calcific atherosclerotic disease of the intra cavernous carotid arteries. Skull: Normal. Negative for fracture or focal lesion. Left posterior skull postsurgical defect. Other: None. CT MAXILLOFACIAL FINDINGS Osseous: No fracture or mandibular dislocation. No destructive process. Orbits: Negative. No traumatic or inflammatory finding. Sinuses: Polypoid mucosal thickening of the right maxillary sinus right ethmoid sinus. Soft tissues: Mild soft tissue swelling of the nose. CT CERVICAL SPINE FINDINGS Alignment: Straightening of cervical lordosis. Skull base and vertebrae: No acute fracture. No primary bone lesion or focal pathologic process. Soft tissues and spinal canal: No prevertebral fluid or swelling. No visible canal hematoma. Disc levels: Multilevel osteoarthritic changes, mild. C5-C6 and C6-C7 disc osteophyte complexes causing mild impression of the ventral spinal canal. Upper chest: Negative. Other: none IMPRESSION: 1. No acute intracranial abnormality. 2. Marked brain parenchymal atrophy and chronic microvascular disease. 3. No evidence of acute traumatic injury to cervical spine. 4. Mild soft tissue swelling of the nose. 5. Multilevel osteoarthritic changes of the cervical spine, mild. Electronically Signed   By: Fidela Salisbury M.D.   On: 02/25/2018 17:34    Assessment/Plan  Patient with Wheezing and Hypoxia.and End of Life Care Will continue Morphine PRN Started on Ativan 0.5 mg Q4 Hours Also Tylenol 650 mg Supository for flushing  and fever Q6 Prn Continue on Albuterol and oxygen PRN D/w the son and DIL in the room. Hospice involved    Family/ staff Communication:   Labs/tests ordered:   Total time spent in this patient care encounter was 40_ minutes; greater than 50% of the visit spent counseling patient, reviewing records , Labs and coordinating care for problems addressed at this encounter.

## 2018-03-13 DIAGNOSIS — N183 Chronic kidney disease, stage 3 (moderate): Secondary | ICD-10-CM | POA: Diagnosis not present

## 2018-03-13 DIAGNOSIS — I129 Hypertensive chronic kidney disease with stage 1 through stage 4 chronic kidney disease, or unspecified chronic kidney disease: Secondary | ICD-10-CM | POA: Diagnosis not present

## 2018-03-13 DIAGNOSIS — G309 Alzheimer's disease, unspecified: Secondary | ICD-10-CM | POA: Diagnosis not present

## 2018-03-13 DIAGNOSIS — S060X9D Concussion with loss of consciousness of unspecified duration, subsequent encounter: Secondary | ICD-10-CM | POA: Diagnosis not present

## 2018-03-13 DIAGNOSIS — E1122 Type 2 diabetes mellitus with diabetic chronic kidney disease: Secondary | ICD-10-CM | POA: Diagnosis not present

## 2018-03-13 DIAGNOSIS — F0280 Dementia in other diseases classified elsewhere without behavioral disturbance: Secondary | ICD-10-CM | POA: Diagnosis not present

## 2018-03-14 NOTE — Progress Notes (Signed)
A user error has taken place.

## 2018-03-15 DIAGNOSIS — I129 Hypertensive chronic kidney disease with stage 1 through stage 4 chronic kidney disease, or unspecified chronic kidney disease: Secondary | ICD-10-CM | POA: Diagnosis not present

## 2018-03-15 DIAGNOSIS — G309 Alzheimer's disease, unspecified: Secondary | ICD-10-CM | POA: Diagnosis not present

## 2018-03-15 DIAGNOSIS — N183 Chronic kidney disease, stage 3 (moderate): Secondary | ICD-10-CM | POA: Diagnosis not present

## 2018-03-15 DIAGNOSIS — S060X9D Concussion with loss of consciousness of unspecified duration, subsequent encounter: Secondary | ICD-10-CM | POA: Diagnosis not present

## 2018-03-15 DIAGNOSIS — E1122 Type 2 diabetes mellitus with diabetic chronic kidney disease: Secondary | ICD-10-CM | POA: Diagnosis not present

## 2018-03-15 DIAGNOSIS — F0280 Dementia in other diseases classified elsewhere without behavioral disturbance: Secondary | ICD-10-CM | POA: Diagnosis not present

## 2018-03-16 DIAGNOSIS — G309 Alzheimer's disease, unspecified: Secondary | ICD-10-CM | POA: Diagnosis not present

## 2018-03-16 DIAGNOSIS — S060X9D Concussion with loss of consciousness of unspecified duration, subsequent encounter: Secondary | ICD-10-CM | POA: Diagnosis not present

## 2018-03-16 DIAGNOSIS — E1122 Type 2 diabetes mellitus with diabetic chronic kidney disease: Secondary | ICD-10-CM | POA: Diagnosis not present

## 2018-03-16 DIAGNOSIS — N183 Chronic kidney disease, stage 3 (moderate): Secondary | ICD-10-CM | POA: Diagnosis not present

## 2018-03-16 DIAGNOSIS — I129 Hypertensive chronic kidney disease with stage 1 through stage 4 chronic kidney disease, or unspecified chronic kidney disease: Secondary | ICD-10-CM | POA: Diagnosis not present

## 2018-03-16 DIAGNOSIS — F0280 Dementia in other diseases classified elsewhere without behavioral disturbance: Secondary | ICD-10-CM | POA: Diagnosis not present

## 2018-03-18 ENCOUNTER — Other Ambulatory Visit: Payer: Self-pay

## 2018-03-18 MED ORDER — LORAZEPAM 0.5 MG PO TABS: 0.5000 mg | ORAL_TABLET | ORAL | 0 refills | Status: AC | PRN

## 2018-03-18 MED ORDER — LORAZEPAM 0.5 MG PO TABS: 0.5000 mg | ORAL_TABLET | ORAL | 0 refills | Status: AC

## 2018-03-19 DIAGNOSIS — E1122 Type 2 diabetes mellitus with diabetic chronic kidney disease: Secondary | ICD-10-CM | POA: Diagnosis not present

## 2018-03-19 DIAGNOSIS — N183 Chronic kidney disease, stage 3 (moderate): Secondary | ICD-10-CM | POA: Diagnosis not present

## 2018-03-19 DIAGNOSIS — I129 Hypertensive chronic kidney disease with stage 1 through stage 4 chronic kidney disease, or unspecified chronic kidney disease: Secondary | ICD-10-CM | POA: Diagnosis not present

## 2018-03-19 DIAGNOSIS — F0280 Dementia in other diseases classified elsewhere without behavioral disturbance: Secondary | ICD-10-CM | POA: Diagnosis not present

## 2018-03-19 DIAGNOSIS — S060X9D Concussion with loss of consciousness of unspecified duration, subsequent encounter: Secondary | ICD-10-CM | POA: Diagnosis not present

## 2018-03-19 DIAGNOSIS — G309 Alzheimer's disease, unspecified: Secondary | ICD-10-CM | POA: Diagnosis not present

## 2018-03-22 DIAGNOSIS — G309 Alzheimer's disease, unspecified: Secondary | ICD-10-CM | POA: Diagnosis not present

## 2018-03-22 DIAGNOSIS — S060X9D Concussion with loss of consciousness of unspecified duration, subsequent encounter: Secondary | ICD-10-CM | POA: Diagnosis not present

## 2018-03-22 DIAGNOSIS — F0280 Dementia in other diseases classified elsewhere without behavioral disturbance: Secondary | ICD-10-CM | POA: Diagnosis not present

## 2018-03-22 DIAGNOSIS — N183 Chronic kidney disease, stage 3 (moderate): Secondary | ICD-10-CM | POA: Diagnosis not present

## 2018-03-22 DIAGNOSIS — E1122 Type 2 diabetes mellitus with diabetic chronic kidney disease: Secondary | ICD-10-CM | POA: Diagnosis not present

## 2018-03-22 DIAGNOSIS — I129 Hypertensive chronic kidney disease with stage 1 through stage 4 chronic kidney disease, or unspecified chronic kidney disease: Secondary | ICD-10-CM | POA: Diagnosis not present

## 2018-04-07 DEATH — deceased

## 2019-03-08 IMAGING — CT CT CERVICAL SPINE W/O CM
5 of 10 series · 11 of 34 positions shown, 12 images · non-contrast
Comparison: 02/03/2015, head CT.

CLINICAL DATA: Unwitnessed fall out of bed.

EXAM:
CT HEAD WITHOUT CONTRAST
CT MAXILLOFACIAL WITHOUT CONTRAST
CT CERVICAL SPINE WITHOUT CONTRAST
TECHNIQUE: Multidetector CT imaging of the head, cervical spine, and
maxillofacial structures were performed using the standard protocol
without intravenous contrast. Multiplanar CT image reconstructions
of the cervical spine and maxillofacial structures were also
generated.

[Series 9: c spine soft · axial · 0.34mm/px · z∈[+1373,+1437]mm · 2 of 98 slices shown]
[im 33/98  soft-tissue]
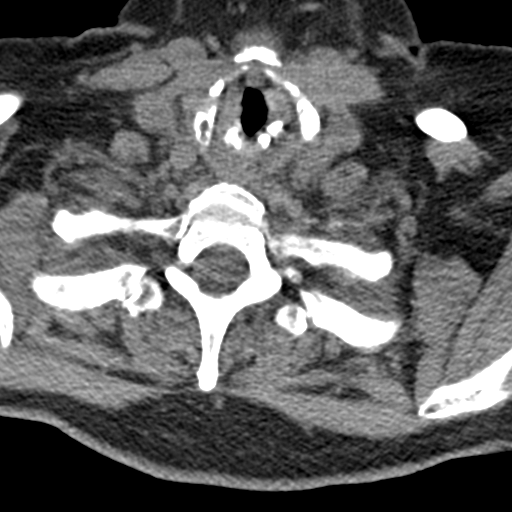
[im 65/98  soft-tissue]
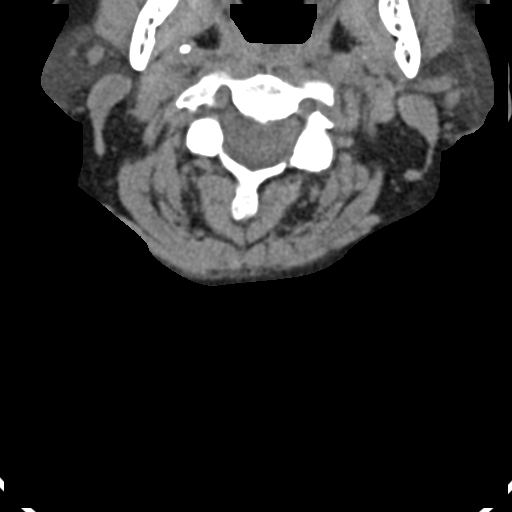

[Series 11: orthogonal axials · axial · 0.22mm/px · z∈[+1336,+1435]mm · 3 of 114 slices shown, 4 images]
[im 29/114  soft-tissue]
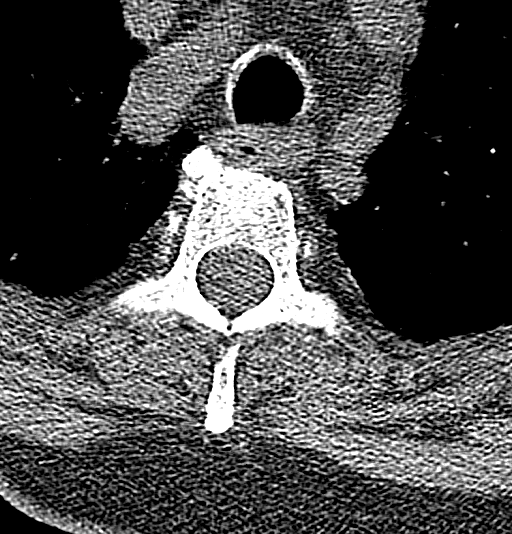
[im 29/114  bone]
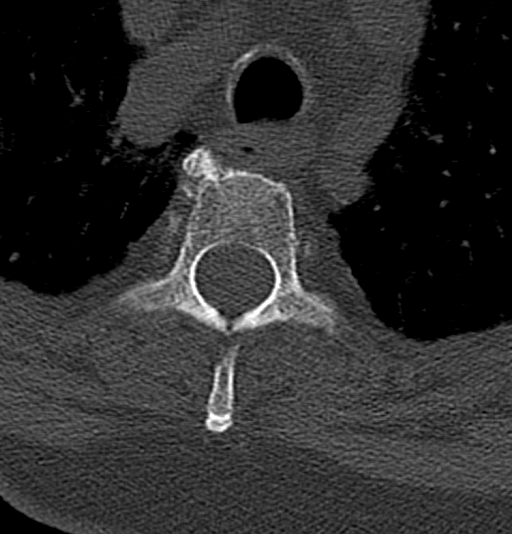
[im 57/114  bone]
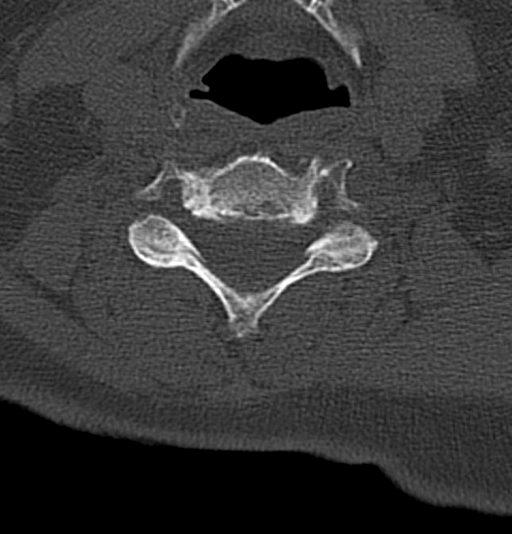
[im 85/114  bone]
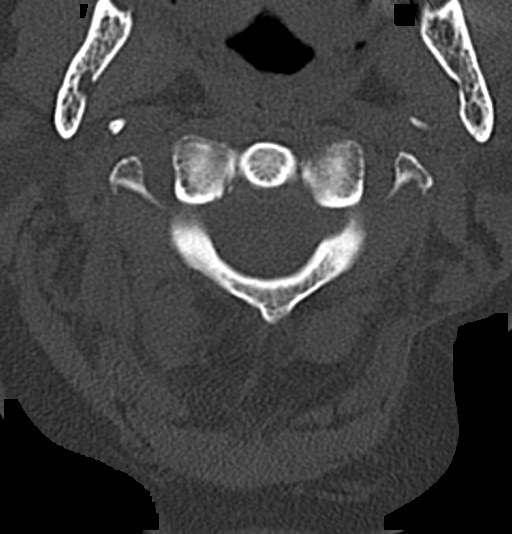

[Series 13: sagittal bone · sagittal · 0.23mm/px · 2 of 58 slices shown]
[im 20/58  bone]
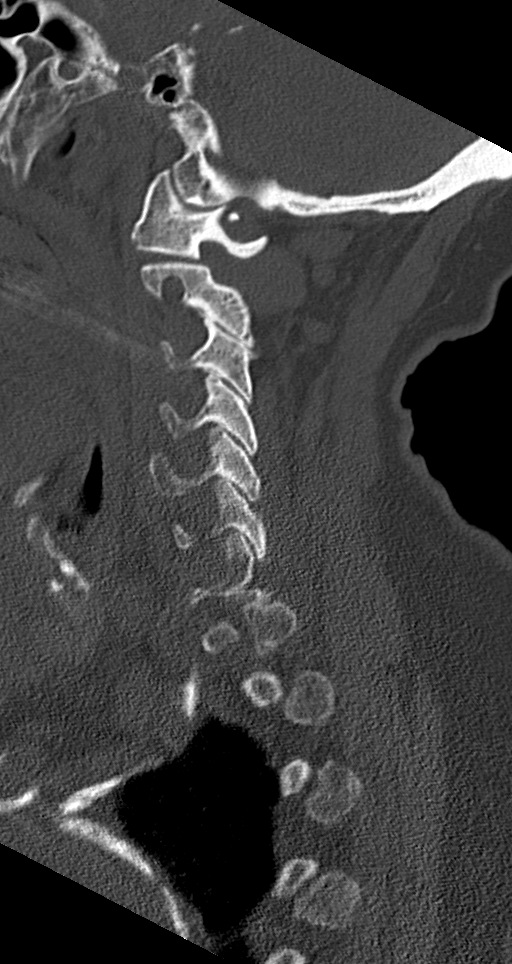
[im 39/58  bone]
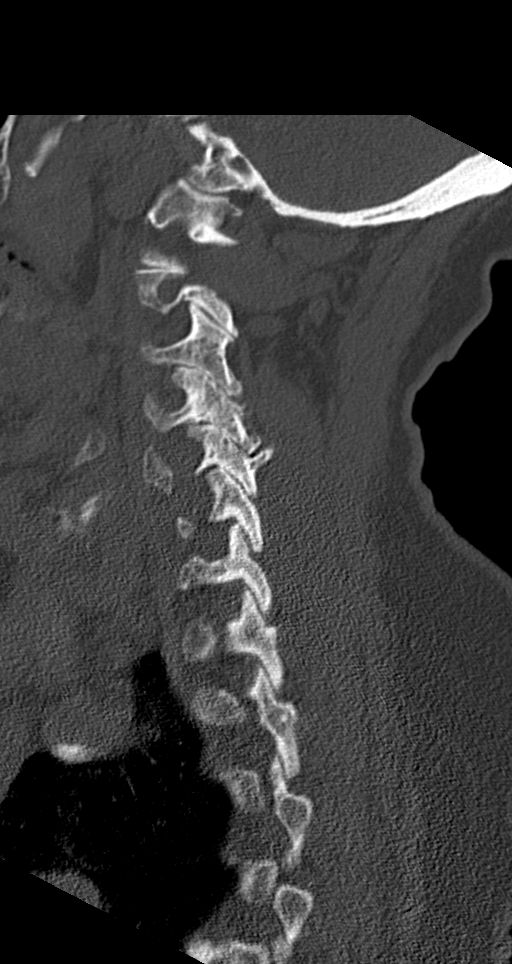

[Series 14: max soft · axial · 0.33mm/px · z∈[+1420,+1474]mm · 2 of 83 slices shown]
[im 28/83  soft-tissue]
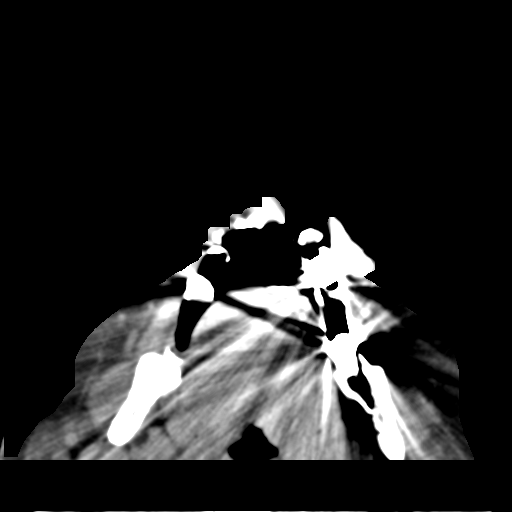
[im 55/83  soft-tissue]
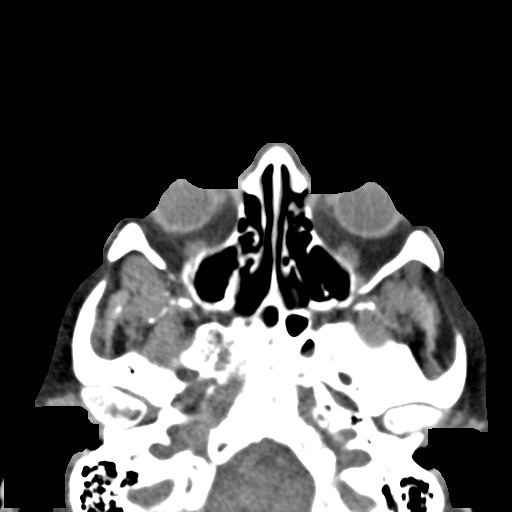

[Series 18: coronal soft · coronal · 0.33mm/px · 2 of 72 slices shown]
[im 11/72  bone]
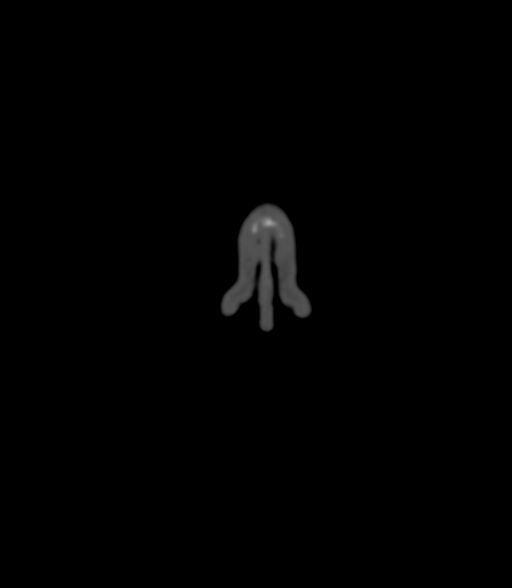
[im 41/72  bone]
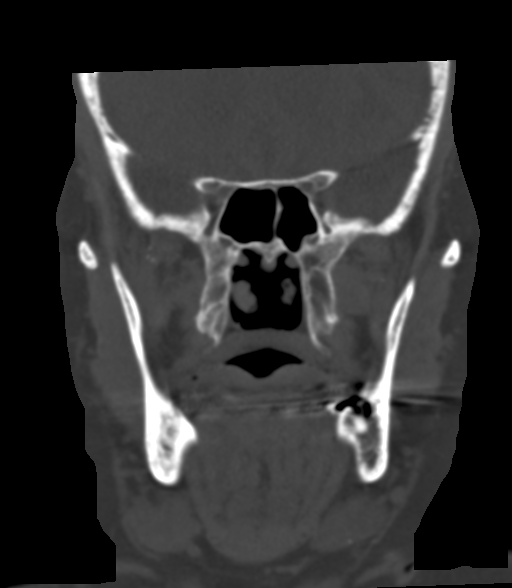

[11 of 34 positions shown; findings below may reference images not displayed]

FINDINGS: CT HEAD FINDINGS

Brain: No evidence of acute infarction, hemorrhage, hydrocephalus,
extra-axial collection or mass lesion/mass effect. Marked brain
parenchymal volume loss and deep white matter microangiopathy.
Postsurgical changes in the left posterior fossa.

Vascular: Calcific atherosclerotic disease of the intra cavernous
carotid arteries.

Skull: Normal. Negative for fracture or focal lesion. Left posterior
skull postsurgical defect.

Other: None.

CT MAXILLOFACIAL FINDINGS

Osseous: No fracture or mandibular dislocation. No destructive
process.

Orbits: Negative. No traumatic or inflammatory finding.

Sinuses: Polypoid mucosal thickening of the right maxillary sinus
right ethmoid sinus.

Soft tissues: Mild soft tissue swelling of the nose.

CT CERVICAL SPINE FINDINGS

Alignment: Straightening of cervical lordosis.

Skull base and vertebrae: No acute fracture. No primary bone lesion
or focal pathologic process.

Soft tissues and spinal canal: No prevertebral fluid or swelling. No
visible canal hematoma.

Disc levels: Multilevel osteoarthritic changes, mild. C5-C6 and
C6-C7 disc osteophyte complexes causing mild impression of the
ventral spinal canal.

Upper chest: Negative.

Other: none
IMPRESSION: 1. No acute intracranial abnormality.
2. Marked brain parenchymal atrophy and chronic microvascular
disease.
3. No evidence of acute traumatic injury to cervical spine.
4. Mild soft tissue swelling of the nose.
5. Multilevel osteoarthritic changes of the cervical spine, mild.
